# Patient Record
Sex: Female | Born: 1975 | Race: White | Hispanic: No | Marital: Married | Smoking: Current every day smoker
Health system: Southern US, Community
[De-identification: ages and names within clinical notes are randomized; demographics above are authoritative.]

## PROBLEM LIST (undated history)

## (undated) DIAGNOSIS — R0600 Dyspnea, unspecified: Secondary | ICD-10-CM

## (undated) DIAGNOSIS — R7303 Prediabetes: Secondary | ICD-10-CM

## (undated) DIAGNOSIS — E559 Vitamin D deficiency, unspecified: Secondary | ICD-10-CM

## (undated) DIAGNOSIS — E785 Hyperlipidemia, unspecified: Secondary | ICD-10-CM

## (undated) DIAGNOSIS — T7840XA Allergy, unspecified, initial encounter: Secondary | ICD-10-CM

## (undated) DIAGNOSIS — I10 Essential (primary) hypertension: Secondary | ICD-10-CM

## (undated) DIAGNOSIS — K219 Gastro-esophageal reflux disease without esophagitis: Secondary | ICD-10-CM

## (undated) DIAGNOSIS — J189 Pneumonia, unspecified organism: Secondary | ICD-10-CM

## (undated) DIAGNOSIS — N809 Endometriosis, unspecified: Secondary | ICD-10-CM

## (undated) DIAGNOSIS — G43909 Migraine, unspecified, not intractable, without status migrainosus: Secondary | ICD-10-CM

## (undated) HISTORY — DX: Allergy, unspecified, initial encounter: T78.40XA

## (undated) HISTORY — DX: Prediabetes: R73.03

## (undated) HISTORY — DX: Hyperlipidemia, unspecified: E78.5

## (undated) HISTORY — DX: Gastro-esophageal reflux disease without esophagitis: K21.9

## (undated) HISTORY — DX: Vitamin D deficiency, unspecified: E55.9

## (undated) HISTORY — DX: Endometriosis, unspecified: N80.9

## (undated) HISTORY — PX: ABDOMINAL HYSTERECTOMY: SHX81

## (undated) HISTORY — PX: EYE SURGERY: SHX253

## (undated) HISTORY — DX: Essential (primary) hypertension: I10

## (undated) HISTORY — DX: Migraine, unspecified, not intractable, without status migrainosus: G43.909

---

## 1998-05-02 ENCOUNTER — Emergency Department (HOSPITAL_COMMUNITY): Admission: EM | Admit: 1998-05-02 | Discharge: 1998-05-02 | Payer: Self-pay | Admitting: Emergency Medicine

## 2001-04-10 ENCOUNTER — Other Ambulatory Visit: Admission: RE | Admit: 2001-04-10 | Discharge: 2001-04-10 | Payer: Self-pay | Admitting: *Deleted

## 2001-05-09 ENCOUNTER — Other Ambulatory Visit: Admission: RE | Admit: 2001-05-09 | Discharge: 2001-05-09 | Payer: Self-pay | Admitting: Gynecology

## 2001-10-26 ENCOUNTER — Other Ambulatory Visit: Admission: RE | Admit: 2001-10-26 | Discharge: 2001-10-26 | Payer: Self-pay | Admitting: Gynecology

## 2002-05-17 ENCOUNTER — Other Ambulatory Visit: Admission: RE | Admit: 2002-05-17 | Discharge: 2002-05-17 | Payer: Self-pay | Admitting: Gynecology

## 2002-07-12 HISTORY — PX: REFRACTIVE SURGERY: SHX103

## 2003-02-19 ENCOUNTER — Other Ambulatory Visit: Admission: RE | Admit: 2003-02-19 | Discharge: 2003-02-19 | Payer: Self-pay | Admitting: Internal Medicine

## 2003-07-13 HISTORY — PX: REFRACTIVE SURGERY: SHX103

## 2004-03-17 ENCOUNTER — Ambulatory Visit (HOSPITAL_COMMUNITY): Admission: RE | Admit: 2004-03-17 | Discharge: 2004-03-17 | Payer: Self-pay | Admitting: Internal Medicine

## 2004-03-30 ENCOUNTER — Other Ambulatory Visit: Admission: RE | Admit: 2004-03-30 | Discharge: 2004-03-30 | Payer: Self-pay | Admitting: *Deleted

## 2005-08-04 ENCOUNTER — Other Ambulatory Visit: Admission: RE | Admit: 2005-08-04 | Discharge: 2005-08-04 | Payer: Self-pay | Admitting: Gynecology

## 2006-08-16 ENCOUNTER — Other Ambulatory Visit: Admission: RE | Admit: 2006-08-16 | Discharge: 2006-08-16 | Payer: Self-pay | Admitting: Gynecology

## 2008-05-20 ENCOUNTER — Ambulatory Visit (HOSPITAL_COMMUNITY): Admission: RE | Admit: 2008-05-20 | Discharge: 2008-05-20 | Payer: Self-pay | Admitting: Internal Medicine

## 2010-10-26 ENCOUNTER — Emergency Department: Payer: Self-pay | Admitting: Emergency Medicine

## 2012-05-03 ENCOUNTER — Other Ambulatory Visit: Payer: Self-pay | Admitting: Emergency Medicine

## 2012-05-03 DIAGNOSIS — N926 Irregular menstruation, unspecified: Secondary | ICD-10-CM

## 2012-05-08 ENCOUNTER — Ambulatory Visit
Admission: RE | Admit: 2012-05-08 | Discharge: 2012-05-08 | Disposition: A | Discharge status: Home / Self Care | Payer: 59 | Source: Ambulatory Visit | Attending: Emergency Medicine | Admitting: Emergency Medicine

## 2012-05-08 ENCOUNTER — Ambulatory Visit (HOSPITAL_COMMUNITY)
Admission: RE | Admit: 2012-05-08 | Discharge: 2012-05-08 | Disposition: A | Discharge status: Home / Self Care | Payer: 59 | Source: Ambulatory Visit | Attending: Internal Medicine | Admitting: Internal Medicine

## 2012-05-08 ENCOUNTER — Other Ambulatory Visit (HOSPITAL_COMMUNITY): Payer: Self-pay | Admitting: Internal Medicine

## 2012-05-08 DIAGNOSIS — M25559 Pain in unspecified hip: Secondary | ICD-10-CM | POA: Insufficient documentation

## 2012-05-08 DIAGNOSIS — M545 Low back pain, unspecified: Secondary | ICD-10-CM | POA: Insufficient documentation

## 2012-05-08 DIAGNOSIS — N926 Irregular menstruation, unspecified: Secondary | ICD-10-CM

## 2013-05-27 ENCOUNTER — Encounter: Payer: Self-pay | Admitting: Physician Assistant

## 2013-05-27 DIAGNOSIS — E785 Hyperlipidemia, unspecified: Secondary | ICD-10-CM | POA: Insufficient documentation

## 2013-05-27 DIAGNOSIS — K219 Gastro-esophageal reflux disease without esophagitis: Secondary | ICD-10-CM | POA: Insufficient documentation

## 2013-05-27 DIAGNOSIS — I10 Essential (primary) hypertension: Secondary | ICD-10-CM | POA: Insufficient documentation

## 2013-05-27 DIAGNOSIS — E559 Vitamin D deficiency, unspecified: Secondary | ICD-10-CM

## 2013-05-27 DIAGNOSIS — R7303 Prediabetes: Secondary | ICD-10-CM

## 2013-05-30 ENCOUNTER — Ambulatory Visit: Payer: 59 | Admitting: Physician Assistant

## 2013-05-30 ENCOUNTER — Encounter: Payer: Self-pay | Admitting: Physician Assistant

## 2013-05-30 VITALS — BP 122/80 | HR 60 | Temp 97.7°F | Resp 16 | Ht 64.0 in | Wt 151.0 lb

## 2013-05-30 DIAGNOSIS — E559 Vitamin D deficiency, unspecified: Secondary | ICD-10-CM

## 2013-05-30 DIAGNOSIS — Z79899 Other long term (current) drug therapy: Secondary | ICD-10-CM

## 2013-05-30 DIAGNOSIS — I10 Essential (primary) hypertension: Secondary | ICD-10-CM

## 2013-05-30 DIAGNOSIS — N3000 Acute cystitis without hematuria: Secondary | ICD-10-CM

## 2013-05-30 DIAGNOSIS — K219 Gastro-esophageal reflux disease without esophagitis: Secondary | ICD-10-CM

## 2013-05-30 DIAGNOSIS — E785 Hyperlipidemia, unspecified: Secondary | ICD-10-CM

## 2013-05-30 DIAGNOSIS — R7303 Prediabetes: Secondary | ICD-10-CM

## 2013-05-30 LAB — CBC WITH DIFFERENTIAL/PLATELET
Basophils Absolute: 0.1 10*3/uL (ref 0.0–0.1)
Basophils Relative: 1 % (ref 0–1)
Eosinophils Absolute: 0.1 10*3/uL (ref 0.0–0.7)
Eosinophils Relative: 1 % (ref 0–5)
Hemoglobin: 13.9 g/dL (ref 12.0–15.0)
Lymphocytes Relative: 31 % (ref 12–46)
Lymphs Abs: 3.2 10*3/uL (ref 0.7–4.0)
Monocytes Relative: 7 % (ref 3–12)
Neutrophils Relative %: 60 % (ref 43–77)
Platelets: 185 10*3/uL (ref 150–400)
RBC: 4.41 MIL/uL (ref 3.87–5.11)
WBC: 10.1 10*3/uL (ref 4.0–10.5)

## 2013-05-30 LAB — HEPATIC FUNCTION PANEL
AST: 17 U/L (ref 0–37)
Alkaline Phosphatase: 69 U/L (ref 39–117)
Bilirubin, Direct: 0.1 mg/dL (ref 0.0–0.3)
Total Protein: 7 g/dL (ref 6.0–8.3)

## 2013-05-30 LAB — LIPID PANEL
Cholesterol: 145 mg/dL (ref 0–200)
Total CHOL/HDL Ratio: 4.3 Ratio
Triglycerides: 117 mg/dL (ref <150)

## 2013-05-30 LAB — BASIC METABOLIC PANEL WITH GFR
CO2: 30 mEq/L (ref 19–32)
Chloride: 104 mEq/L (ref 96–112)
GFR, Est Non African American: >89 mL/min
Potassium: 4.1 mEq/L (ref 3.5–5.3)

## 2013-05-30 MED ORDER — CIPROFLOXACIN HCL 500 MG PO TABS: 500.0000 mg | ORAL_TABLET | Freq: Two times a day (BID) | ORAL | Status: AC

## 2013-05-30 NOTE — Patient Instructions (Signed)

## 2013-05-30 NOTE — Progress Notes (Addendum)
HPI Patient presents for 3 month follow up with hypertension, hyperlipidemia, prediabetes and vitamin D. Patient's blood pressure has been controlled at home. Patient denies chest pain, shortness of breath, dizziness.  Patient's cholesterol is diet controlled. In addition they are on zocor 40  and denies myalgias. The cholesterol last visit was LDL 84, HDL 28.. The patient has been working on diet and exercise, increased walking 3 miles a week, and decrease white bread, for prediabetes, and denies changes in vision, polys, and paresthesias. A1C 5.8 Patient is on Vitamin D supplement.  Started magesium GERD on pepcid and prilosec- occasional chest tightness, pressure have to sit up at night, especially with pizza, spaghetti, fried shrimp.  Current Medications:  Current Outpatient Prescriptions on File Prior to Visit  Medication Sig Dispense Refill  . cholecalciferol (VITAMIN D) 1000 UNITS tablet Take 2,000 Units by mouth daily.      . famotidine (PEPCID) 40 MG tablet Take 40 mg by mouth daily.      Marland Kitchen loratadine (CLARITIN) 10 MG tablet Take 10 mg by mouth daily.      . metoprolol tartrate (LOPRESSOR) 25 MG tablet Take 25 mg by mouth daily.      . nitroGLYCERIN (NITROSTAT) 0.3 MG SL tablet Place 0.3 mg under the tongue every 5 (five) minutes as needed for chest pain.      . simvastatin (ZOCOR) 40 MG tablet Take 40 mg by mouth daily.       No current facility-administered medications on file prior to visit.   Medical History:  Past Medical History  Diagnosis Date  . Hypertension   . Hyperlipidemia   . GERD (gastroesophageal reflux disease)   . Allergy   . Vitamin D deficiency   . Endometriosis   . Migraine   . Prediabetes    Allergies:  Allergies  Allergen Reactions  . Penicillins     rash    ROS Constitutional: Denies fever, chills, weight loss/gain, headaches, insomnia, fatigue, night sweats, and change in appetite. Eyes: Denies redness, blurred vision, diplopia, discharge,  itchy, watery eyes.  ENT: Denies discharge, congestion, post nasal drip, sore throat, earache, dental pain, Tinnitus, Vertigo, Sinus pain, snoring.  Cardio: Denies chest pain, palpitations, irregular heartbeat,  dyspnea, diaphoresis, orthopnea, PND, claudication, edema Respiratory: denies cough, dyspnea,pleurisy, hoarseness, wheezing.  Gastrointestinal: + GERD Denies dysphagia, pain, cramps, nausea, vomiting, bloating, diarrhea, constipation, hematemesis, melena, hematochezia,  hemorrhoids Genitourinary: + frequency, urgency, and left lower back pain for one week.  Musculoskeletal: Denies arthralgia, myalgia, stiffness, Jt. Swelling, pain, limp, and strain/sprain. Skin: Denies pruritis, rash, hives, warts, acne, eczema, changing in skin lesion Neuro: Weakness, tremor, incoordination, spasms, paresthesia, pain Psychiatric: Denies confusion, memory loss, sensory loss Endocrine: Denies change in weight, skin, hair change, nocturia, and paresthesia, Diabetic Polys, visual blurring, hyper /hypo glycemic episodes.  Heme/Lymph: Excessive bleeding, bruising, enlarged lymph nodes  Family history- Review and unchanged Social history- Review and unchanged Physical Exam: Filed Vitals:   05/30/13 0938  BP: 122/80  Pulse: 60  Temp: 97.7 F (36.5 C)  Resp: 16   Filed Weights   05/30/13 0938  Weight: 151 lb (68.493 kg)   General Appearance: Well nourished, in no apparent distress. Eyes: PERRLA, EOMs, conjunctiva no swelling or erythema, normal fundi and vessels. Sinuses: No Frontal/maxillary tenderness ENT/Mouth: Ext aud canals clear, with TMs without erythema, bulging.No erythema, swelling, or exudate on post pharynx.  Tonsils not swollen or erythematous. Hearing normal.  Neck: Supple, thyroid normal.  Respiratory: Respiratory effort normal, BS equal bilaterally  without rales, rhonchi, wheezing or stridor.  Cardio: Heart sounds normal, regular rate and rhythm without murmurs, rubs or gallops.  Peripheral pulses brisk and equal bilaterally, without edema.  Abdomen: Flat, soft, with bowel sounds. Mild epigastric tenderness, no guarding, rebound, hernias, masses, or organomegaly. + CVA tenderness Lymphatics: Non tender without lymphadenopathy.  Musculoskeletal: Full ROM all peripheral extremities, joint stability, 5/5 strength, and normal gait. Skin: Warm, dry without rashes, lesions, ecchymosis.  Neuro: Cranial nerves intact, reflexes equal bilaterally. Normal muscle tone, no cerebellar symptoms. Sensation intact.  Psych: Awake and oriented X 3, normal affect, Insight and Judgment appropriate.   Assessment and Plan:  Hypertension: Continue medication, monitor blood pressure at home. Continue DASH diet. Cholesterol: Continue diet and exercise. Check cholesterol.  Pre-diabetes-Continue diet and exercise. Check A1C Vitamin D Def- check level and continue medications.  GERD- resistant to PPI and H2- will check for Hpylori, continue PPI ?UTI- check UA/ C&C- start cipro Smoking- was on chantix 1/2 pill for 3 weeks but then had itching with it so stopped. Patient doesn't know if it was nerves or chantix, will restart after the holidays.   Quentin Mulling 10:08 AM   Patient had a positive Hpylori blood test. Will call in ABX and PPI for treatment.

## 2013-05-31 LAB — URINE CULTURE
Colony Count: NO GROWTH
Organism ID, Bacteria: NO GROWTH

## 2013-05-31 LAB — URINALYSIS, ROUTINE W REFLEX MICROSCOPIC
Leukocytes, UA: NEGATIVE
Nitrite: NEGATIVE
Protein, ur: NEGATIVE mg/dL

## 2013-05-31 LAB — INSULIN, FASTING: Insulin fasting, serum: 4 u[IU]/mL (ref 3–28)

## 2013-05-31 LAB — HELICOBACTER PYLORI ABS-IGG+IGA, BLD: HELICOBACTER PYLORI AB, IGA: 4 U/mL (ref <9.0)

## 2013-05-31 LAB — VITAMIN D 25 HYDROXY (VIT D DEFICIENCY, FRACTURES): Vit D, 25-Hydroxy: 74 ng/mL (ref 30–89)

## 2013-06-01 MED ORDER — CLARITHROMYCIN 500 MG PO TABS: 500.0000 mg | ORAL_TABLET | Freq: Two times a day (BID) | ORAL | Status: AC

## 2013-06-01 MED ORDER — OMEPRAZOLE 20 MG PO CPDR: 20.0000 mg | DELAYED_RELEASE_CAPSULE | Freq: Two times a day (BID) | ORAL | Status: DC

## 2013-06-01 MED ORDER — METRONIDAZOLE 500 MG PO TABS: 500.0000 mg | ORAL_TABLET | Freq: Two times a day (BID) | ORAL | Status: AC

## 2013-06-01 NOTE — Addendum Note (Signed)
Addended by: Quentin Mulling R on: 06/01/2013 08:33 AM   Modules accepted: Orders

## 2013-07-19 ENCOUNTER — Encounter: Payer: Self-pay | Admitting: Physician Assistant

## 2013-08-01 ENCOUNTER — Encounter: Payer: Self-pay | Admitting: Physician Assistant

## 2013-08-01 ENCOUNTER — Encounter: Payer: Self-pay | Admitting: Internal Medicine

## 2013-08-01 ENCOUNTER — Ambulatory Visit (INDEPENDENT_AMBULATORY_CARE_PROVIDER_SITE_OTHER): Payer: 59 | Admitting: Physician Assistant

## 2013-08-01 VITALS — BP 120/78 | HR 68 | Temp 98.0°F | Resp 16 | Ht 64.0 in | Wt 149.0 lb

## 2013-08-01 DIAGNOSIS — IMO0001 Reserved for inherently not codable concepts without codable children: Secondary | ICD-10-CM

## 2013-08-01 DIAGNOSIS — Z0000 Encounter for general adult medical examination without abnormal findings: Secondary | ICD-10-CM

## 2013-08-01 DIAGNOSIS — N63 Unspecified lump in unspecified breast: Secondary | ICD-10-CM

## 2013-08-01 DIAGNOSIS — I10 Essential (primary) hypertension: Secondary | ICD-10-CM

## 2013-08-01 DIAGNOSIS — K219 Gastro-esophageal reflux disease without esophagitis: Secondary | ICD-10-CM

## 2013-08-01 LAB — BASIC METABOLIC PANEL WITH GFR
BUN: 8 mg/dL (ref 6–23)
CALCIUM: 9.7 mg/dL (ref 8.4–10.5)
CO2: 29 mEq/L (ref 19–32)
CREATININE: 0.67 mg/dL (ref 0.50–1.10)
Chloride: 104 mEq/L (ref 96–112)
GFR, Est African American: >89 mL/min
GFR, Est Non African American: >89 mL/min
Glucose, Bld: 89 mg/dL (ref 70–99)
Potassium: 4.2 mEq/L (ref 3.5–5.3)
Sodium: 143 mEq/L (ref 135–145)

## 2013-08-01 LAB — CBC WITH DIFFERENTIAL/PLATELET
BASOS ABS: 0 10*3/uL (ref 0.0–0.1)
BASOS PCT: 0 % (ref 0–1)
EOS ABS: 0.1 10*3/uL (ref 0.0–0.7)
EOS PCT: 1 % (ref 0–5)
HCT: 41.5 % (ref 36.0–46.0)
HEMOGLOBIN: 14.2 g/dL (ref 12.0–15.0)
LYMPHS ABS: 2.7 10*3/uL (ref 0.7–4.0)
Lymphocytes Relative: 24 % (ref 12–46)
MCH: 31.2 pg (ref 26.0–34.0)
MCHC: 34.2 g/dL (ref 30.0–36.0)
MCV: 91.2 fL (ref 78.0–100.0)
MONOS PCT: 8 % (ref 3–12)
Monocytes Absolute: 0.9 10*3/uL (ref 0.1–1.0)
NEUTROS ABS: 7.4 10*3/uL (ref 1.7–7.7)
NEUTROS PCT: 67 % (ref 43–77)
PLATELETS: 158 10*3/uL (ref 150–400)
RBC: 4.55 MIL/uL (ref 3.87–5.11)
RDW: 13.8 % (ref 11.5–15.5)
WBC: 11.1 10*3/uL — AB (ref 4.0–10.5)

## 2013-08-01 LAB — HEPATIC FUNCTION PANEL
ALBUMIN: 4.8 g/dL (ref 3.5–5.2)
ALK PHOS: 71 U/L (ref 39–117)
ALT: 20 U/L (ref 0–35)
AST: 18 U/L (ref 0–37)
BILIRUBIN DIRECT: 0.1 mg/dL (ref 0.0–0.3)
BILIRUBIN TOTAL: 0.5 mg/dL (ref 0.3–1.2)
Indirect Bilirubin: 0.4 mg/dL (ref 0.0–0.9)
TOTAL PROTEIN: 7.2 g/dL (ref 6.0–8.3)

## 2013-08-01 LAB — SEDIMENTATION RATE: SED RATE: 8 mm/h (ref 0–22)

## 2013-08-01 LAB — HEMOGLOBIN A1C
Hgb A1c MFr Bld: 5.8 % — ABNORMAL HIGH (ref <5.7)
Mean Plasma Glucose: 120 mg/dL — ABNORMAL HIGH (ref <117)

## 2013-08-01 LAB — CK: CK TOTAL: 32 U/L (ref 7–177)

## 2013-08-01 LAB — LIPID PANEL
CHOL/HDL RATIO: 3.8 ratio
CHOLESTEROL: 145 mg/dL (ref 0–200)
HDL: 38 mg/dL — AB (ref >39)
LDL CALC: 84 mg/dL (ref 0–99)
Triglycerides: 114 mg/dL (ref <150)
VLDL: 23 mg/dL (ref 0–40)

## 2013-08-01 LAB — IRON AND TIBC
%SAT: 37 % (ref 20–55)
Iron: 119 ug/dL (ref 42–145)
TIBC: 321 ug/dL (ref 250–470)
UIBC: 202 ug/dL (ref 125–400)

## 2013-08-01 LAB — VITAMIN B12: Vitamin B-12: 375 pg/mL (ref 211–911)

## 2013-08-01 LAB — MAGNESIUM: Magnesium: 2.1 mg/dL (ref 1.5–2.5)

## 2013-08-01 LAB — TSH: TSH: 0.734 u[IU]/mL (ref 0.350–4.500)

## 2013-08-01 LAB — FERRITIN: Ferritin: 21 ng/mL (ref 10–291)

## 2013-08-01 MED ORDER — LEVOCETIRIZINE DIHYDROCHLORIDE 5 MG PO TABS: 5.0000 mg | ORAL_TABLET | Freq: Every evening | ORAL | Status: DC

## 2013-08-01 MED ORDER — METOPROLOL TARTRATE 25 MG PO TABS: 25.0000 mg | ORAL_TABLET | Freq: Every day | ORAL | Status: DC

## 2013-08-01 MED ORDER — OMEPRAZOLE 40 MG PO CPDR: 40.0000 mg | DELAYED_RELEASE_CAPSULE | Freq: Every day | ORAL | Status: DC

## 2013-08-01 MED ORDER — SIMVASTATIN 40 MG PO TABS: 40.0000 mg | ORAL_TABLET | Freq: Every day | ORAL | Status: DC

## 2013-08-01 NOTE — Patient Instructions (Addendum)
Continue PPI Try lyrica 1-2 pills at night and see if it helps with your leg pain.  Follow up with an EYE doctor    Bad carbs also include fruit juice, alcohol, and sweet tea. These are empty calories that do not signal to your brain that you are full.   Please remember the good carbs are still carbs which convert into sugar. So please measure them out no more than 1/2-1 cup of rice, oatmeal, pasta, and beans.  Veggies are however free foods! Pile them on.   I like lean protein at every meal such as chicken, [place], pork chops, cottage cheese, etc. Just do not fry these meats and please center your meal around vegetable, the meats should be a side dish.   No all fruit is created equal. Please see the list below, the fruit at the bottom is higher in sugars than the fruit at the top   What is the TMJ? The temporomandibular (tem-PUH-ro-man-DIB-yoo-ler) joint, or the TMJ, connects the upper and lower jawbones. This joint allows the jaw to open wide and move back and forth when you chew, talk, or yawn.There are also several muscles that help this joint move. There can be muscle tightness and pain in the muscle that can cause several symptoms.  What causes TMJ pain? There are many causes of TMJ pain. Repeated chewing (for example, chewing gum) and clenching your teeth can cause pain in the joint. Some TMJ pain has no obvious cause. What can I do to ease the pain? There are many things you can do to help your pain get better. When you have pain:  Eat soft foods and stay away from chewy foods (for example, taffy) Try to use both sides of your mouth to chew Don't chew gum Don't open your mouth wide (for example, during yawning or singing) Don't bite your cheeks or fingernails Lower your amount of stress and worry Applying a warm, damp washcloth to the joint may help. Over-the-counter pain medicines such as ibuprofen (one brand: Advil) or acetaminophen (one brand: Tylenol) might also help. Do not  use these medicines if you are allergic to them or if your doctor told you not to use them. How can I stop the pain from coming back? When your pain is better, you can do these exercises to make your muscles stronger and to keep the pain from coming back:  Resisted mouth opening: Place your thumb or two fingers under your chin and open your mouth slowly, pushing up lightly on your chin with your thumb. Hold for three to six seconds. Close your mouth slowly. Resisted mouth closing: Place your thumbs under your chin and your two index fingers on the ridge between your mouth and the bottom of your chin. Push down lightly on your chin as you close your mouth. Tongue up: Slowly open and close your mouth while keeping the tongue touching the roof of the mouth. Side-to-side jaw movement: Place an object about one fourth of an inch thick (for example, two tongue depressors) between your front teeth. Slowly move your jaw from side to side. Increase the thickness of the object as the exercise becomes easier Forward jaw movement: Place an object about one fourth of an inch thick between your front teeth and move the bottom jaw forward so that the bottom teeth are in front of the top teeth. Increase the thickness of the object as the exercise becomes easier. These exercises should not be painful. If it hurts to do  these exercises, stop doing them and talk to your family doctor.

## 2013-08-01 NOTE — Progress Notes (Signed)
Complete Physical HPI Patient presents for complete physical.   Patient's blood pressure has been controlled at home. Patient denies chest pain, shortness of breath, dizziness. BP: 120/78 mmHg  Patient's cholesterol is diet controlled. They are on zocor and denies myalgias. The patient's cholesterol last visit was LDL 84, trigs 136, HDL 28.  The patient has been working on diet and exercise for prediabetes, denies changes in vision, polys, and paresthesias. Last A1C in office was 5.8 Thrombocytopenia with platelets at 149.  Patient continues to have GERD symptoms, she tested positive for Hpylori in November and was on triple therapy for 10 days. She got better but now continues to have GERD, chest tightness, stomach cramping, and unable to lay down.  Patient states she has intermittent "bone pain" that has been more frequent, achy pain, last 20-60 mins. Nothing makes it worse, worse during the day or night but can happen both, + LBP, no numbness tingling, no bowel/bladder incontinence.   Current Medications:  Current Outpatient Prescriptions on File Prior to Visit  Medication Sig Dispense Refill  . cholecalciferol (VITAMIN D) 1000 UNITS tablet Take 2,000 Units by mouth daily.      . famotidine (PEPCID) 40 MG tablet Take 40 mg by mouth daily.      Marland Kitchen levocetirizine (XYZAL) 5 MG tablet Take 5 mg by mouth every evening.      . loratadine (CLARITIN) 10 MG tablet Take 10 mg by mouth daily.      . Magnesium 250 MG TABS Take 250 mg by mouth daily.      . metoprolol tartrate (LOPRESSOR) 25 MG tablet Take 25 mg by mouth daily.      . nitroGLYCERIN (NITROSTAT) 0.3 MG SL tablet Place 0.3 mg under the tongue every 5 (five) minutes as needed for chest pain.      Marland Kitchen omeprazole (PRILOSEC) 20 MG capsule Take 1 capsule (20 mg total) by mouth 2 (two) times daily before a meal.  20 capsule  1  . simvastatin (ZOCOR) 40 MG tablet Take 40 mg by mouth daily.       No current facility-administered medications on file  prior to visit.   Health Maintenance:  Tetanus: 07/2012 Pneumovax: N/A Flu vaccine: declines  Zostavax: N/A Pap: 2013 Dr. Ubaldo Glassing, had an abnormal pap in 2011 but all normal since then. And Dr. Ubaldo Glassing has retired, getting new OB/GYN.  MGM: N/A DEXA: N/A  Colonoscopy:N/A  EGD: N/A Allergies:  Allergies  Allergen Reactions  . Penicillins     rash   Medical History:  Past Medical History  Diagnosis Date  . Hypertension   . Hyperlipidemia   . GERD (gastroesophageal reflux disease)   . Allergy   . Vitamin D deficiency   . Endometriosis   . Migraine   . Prediabetes    Surgical History:  Past Surgical History  Procedure Laterality Date  . Refractive surgery  2004   Family History:  Family History  Problem Relation Age of Onset  . Hyperlipidemia Mother   . Heart disease Father   . Hyperlipidemia Father   . Hypertension Father    Social History:  History   Social History  . Marital Status: Married    Spouse Name: N/A    Number of Children: N/A  . Years of Education: N/A   Occupational History  . Not on file.   Social History Main Topics  . Smoking status: Current Every Day Smoker -- 0.50 packs/day for 20 years  . Smokeless tobacco: Never Used  .  Alcohol Use: No  . Drug Use: No  . Sexual Activity: Not on file   Other Topics Concern  . Not on file   Social History Narrative  . No narrative on file   ROS Constitutional: Denies weight loss/gain, headaches, insomnia, fatigue, night sweats, and change in appetite. Eyes: Denies redness, blurred vision, diplopia, discharge, itchy, watery eyes.  ENT: Denies discharge, congestion, post nasal drip, sore throat, earache, hearing loss, dental pain, Tinnitus, Vertigo, Sinus pain, snoring.  Cardio: Denies chest pain, palpitations, irregular heartbeat, dyspnea, diaphoresis, orthopnea, PND, claudication, edema Respiratory: denies cough, dyspnea, pleurisy, hoarseness, wheezing.  Gastrointestinal: + GERD ,stomach pain,  burping Denies dysphagia,  pain, cramps, nausea, vomiting, diarrhea, constipation, hematemesis, melena, hematochezia, hemorrhoids Genitourinary: Denies dysuria, frequency, urgency, nocturia, hesitancy, discharge, hematuria, flank pain Breast: Denies Breast lumps, nipple discharge, bleeding.  Musculoskeletal: + LBP and leg pain Denies arthralgia, stiffness, Jt. Swelling Skin: Denies pruritis, rash, hives,  acne, eczema, changing in skin lesion Neuro: Denies Weakness, tremor, incoordination, spasms, paresthesia, pain Psychiatric: Denies confusion, memory loss, sensory loss Endocrine: Denies change in weight, skin, hair change, nocturia, and paresthesia, Diabetic Denies Polys, visual blurring, hyper /hypo glycemic episodes.  Heme/Lymph: Denies Excessive bleeding, bruising, enlarged lymph nodes  Physical Exam: Estimated body mass index is 25.56 kg/(m^2) as calculated from the following:   Height as of this encounter: 5' 4"  (1.626 m).   Weight as of this encounter: 149 lb (67.586 kg). Filed Vitals:   08/01/13 0857  BP: 120/78  Pulse: 68  Temp: 98 F (36.7 C)  Resp: 16   General Appearance: Well nourished, in no apparent distress. Eyes: PERRLA, EOMs, conjunctiva no swelling or erythema, normal fundi and vessels. Sinuses: No Frontal/maxillary tenderness ENT/Mouth: Ext aud canals clear, normal light reflex with TMs without erythema, bulging.  Good dentition. No erythema, swelling, or exudate on post pharynx. Tonsils not swollen or erythematous. Hearing normal. + TMJ tenderness on left side> right Neck: Supple, thyroid normal. No bruits Respiratory: Respiratory effort normal, BS equal bilaterally without rales, rhonchi, wheezing or stridor. Cardio: RRR without murmurs, rubs or gallops. Brisk peripheral pulses without edema.  Chest: symmetric, with normal excursions and percussion. Breasts: nontender mobile mass has increased in size since last visit in RUQ of right breast.  Abdomen: Soft, +BS.  RUQ tenderness, no guarding, rebound, hernias, masses, or organomegaly. .  Lymphatics: Non tender without lymphadenopathy.  Genitourinary: defer Musculoskeletal: Full ROM all peripheral extremities,5/5 strength, and normal gait. + RSI tenderness Skin: Warm, dry without rashes, lesions, ecchymosis.  Neuro: Cranial nerves intact, reflexes equal bilaterally. Normal muscle tone, no cerebellar symptoms. Sensation intact.  Psych: Awake and oriented X 3, normal affect, Insight and Judgment appropriate.   EKG: WNL no changes.  Assessment and Plan: Hypertension- at goal  Hyperlipidemia- cont diet and exercise, check level  Allergy  Vitamin D deficiency- check level  Endometriosis- follow up OB/GYN  Migraine- controlled  Prediabetes- cont diet and exercise, check level  Thrombocytopenia- check CBC GERD- history Hpylori with treatment- ? If eradicated- discussed doing stool vs sending to GI for EGD, patient would prefer to see GI and I agree to r/o ulcers as well  No NSAID, continue PPI , will refer to GI.  RUQ pain with mother with history of gallstones- get U/S Leg pain- normal Xray lumbar in 2013- ? Spinal stenosis will check CPK/ESR and treat conservatively.  PAP- due now- prefers done at OB/GYN.  TMJ- given info and get mouth guard.  Right breast- nontender mobile mass has  increased in size- get dMGM   Discussed med's effects and SE's. Screening labs and tests as requested with regular follow-up as recommended.   Vicie Mutters 9:18 AM

## 2013-08-02 ENCOUNTER — Other Ambulatory Visit: Payer: Self-pay | Admitting: Physician Assistant

## 2013-08-02 DIAGNOSIS — N63 Unspecified lump in unspecified breast: Secondary | ICD-10-CM

## 2013-08-02 LAB — URINALYSIS, ROUTINE W REFLEX MICROSCOPIC
BILIRUBIN URINE: NEGATIVE
GLUCOSE, UA: NEGATIVE mg/dL
Hgb urine dipstick: NEGATIVE
KETONES UR: NEGATIVE mg/dL
LEUKOCYTES UA: NEGATIVE
Nitrite: NEGATIVE
Protein, ur: NEGATIVE mg/dL
Urobilinogen, UA: 0.2 mg/dL (ref 0.0–1.0)
pH: 7 (ref 5.0–8.0)

## 2013-08-02 LAB — MICROALBUMIN / CREATININE URINE RATIO
Creatinine, Urine: 24.4 mg/dL
MICROALB UR: 0.5 mg/dL (ref 0.00–1.89)
MICROALB/CREAT RATIO: 20.5 mg/g (ref 0.0–30.0)

## 2013-08-02 LAB — INSULIN, FASTING: Insulin fasting, serum: 11 u[IU]/mL (ref 3–28)

## 2013-08-02 LAB — VITAMIN D 25 HYDROXY (VIT D DEFICIENCY, FRACTURES): VIT D 25 HYDROXY: 89 ng/mL (ref 30–89)

## 2013-08-03 ENCOUNTER — Ambulatory Visit
Admission: RE | Admit: 2013-08-03 | Discharge: 2013-08-03 | Disposition: A | Discharge status: Home / Self Care | Payer: 59 | Source: Ambulatory Visit | Attending: Physician Assistant | Admitting: Physician Assistant

## 2013-08-03 DIAGNOSIS — K219 Gastro-esophageal reflux disease without esophagitis: Secondary | ICD-10-CM

## 2013-08-09 ENCOUNTER — Other Ambulatory Visit: Payer: Self-pay | Admitting: Physician Assistant

## 2013-08-09 ENCOUNTER — Ambulatory Visit
Admission: RE | Admit: 2013-08-09 | Discharge: 2013-08-09 | Disposition: A | Discharge status: Home / Self Care | Payer: 59 | Source: Ambulatory Visit | Attending: Physician Assistant | Admitting: Physician Assistant

## 2013-08-09 DIAGNOSIS — N63 Unspecified lump in unspecified breast: Secondary | ICD-10-CM

## 2013-08-28 ENCOUNTER — Ambulatory Visit: Payer: 59 | Admitting: Internal Medicine

## 2013-09-10 ENCOUNTER — Encounter: Payer: Self-pay | Admitting: Internal Medicine

## 2013-09-11 ENCOUNTER — Ambulatory Visit (INDEPENDENT_AMBULATORY_CARE_PROVIDER_SITE_OTHER): Payer: 59 | Admitting: Internal Medicine

## 2013-09-11 ENCOUNTER — Encounter: Payer: Self-pay | Admitting: Internal Medicine

## 2013-09-11 ENCOUNTER — Other Ambulatory Visit (INDEPENDENT_AMBULATORY_CARE_PROVIDER_SITE_OTHER): Payer: 59

## 2013-09-11 VITALS — BP 126/84 | HR 78 | Ht 65.0 in | Wt 151.0 lb

## 2013-09-11 DIAGNOSIS — R140 Abdominal distension (gaseous): Secondary | ICD-10-CM

## 2013-09-11 DIAGNOSIS — K824 Cholesterolosis of gallbladder: Secondary | ICD-10-CM | POA: Insufficient documentation

## 2013-09-11 DIAGNOSIS — R143 Flatulence: Secondary | ICD-10-CM

## 2013-09-11 DIAGNOSIS — R0789 Other chest pain: Secondary | ICD-10-CM

## 2013-09-11 DIAGNOSIS — K219 Gastro-esophageal reflux disease without esophagitis: Secondary | ICD-10-CM

## 2013-09-11 DIAGNOSIS — R141 Gas pain: Secondary | ICD-10-CM

## 2013-09-11 DIAGNOSIS — R142 Eructation: Secondary | ICD-10-CM

## 2013-09-11 DIAGNOSIS — Z8619 Personal history of other infectious and parasitic diseases: Secondary | ICD-10-CM

## 2013-09-11 DIAGNOSIS — K589 Irritable bowel syndrome without diarrhea: Secondary | ICD-10-CM

## 2013-09-11 LAB — IGA: IgA: 149 mg/dL (ref 68–378)

## 2013-09-11 MED ORDER — DEXLANSOPRAZOLE 60 MG PO CPDR: 60.0000 mg | DELAYED_RELEASE_CAPSULE | Freq: Every day | ORAL | Status: DC

## 2013-09-11 NOTE — Progress Notes (Signed)
Patient ID: Andrea Owens, female   DOB: 1976/06/28, 38 y.o.   MRN: [id number] HPI: Andrea Owens is a 38 year old female with a past medical history of GERD, hypertension, hyperlipidemia, migraines who is seen in consultation at the request of Vicie Mutters, PA-C to evaluate GERD and atypical chest pain. She is here today alone. She reports several years of GERD symptoms and chest pressure which has been worsening over the past year. She reports a substernal chest pressure and tightness which can be severe. This does not happen every day and seems to build, and when it occurs can happen on several consecutive days. She does not have true heartburn but does endorse waterbrash and occasional regurgitation. When her chest pressure is most severe she feels a mid abdominal soreness which is bilateral. She denies nausea or vomiting. No dysphagia or diet dysphagia. No weight loss. Her appetite has been good. She tried omeprazole 40 mg daily and found no benefit. She is currently using famotidine 40 mg once daily. With this, her symptoms seem to be better but not completely. She was given sublingual nitroglycerin which she used once for chest pressure/tightness but she did not like the response. She felt headache and disorientation. She doesn't recall it helps significantly. She also reports frequent gas and bloating. She has tried to modify her diet including avoiding spicy foods. She is noted that let us and popcorn make her symptoms worse. Bowel habits have been somewhat erratic and she tends towards constipation long-standing. She started magnesium supplementation for her hypomagnesemia and with this her stools have been a little more frequent. She has a bowel movement about 4 days per week. She denies blood in her stool or melena. She does not relate bowel movements to her abdominal discomfort.  Of note she was diagnosed with H. Pylori in Nov 2014 by antibody and treated with ciprofloxacin, metronidazole and  clarithromycin. During her antibiotics for 2 weeks she did not have any symptoms, but her above-mentioned symptoms returned after antibiotic cessation  Past Medical History  Diagnosis Date  . Hypertension   . Hyperlipidemia   . GERD (gastroesophageal reflux disease)   . Allergy   . Vitamin D deficiency   . Endometriosis   . Migraine   . Prediabetes     Past Surgical History  Procedure Laterality Date  . Refractive surgery  2004    Current Outpatient Prescriptions  Medication Sig Dispense Refill  . cholecalciferol (VITAMIN D) 1000 UNITS tablet Take 2,000 Units by mouth daily.      . famotidine (PEPCID) 40 MG tablet Take 40 mg by mouth daily.      Marland Kitchen levocetirizine (XYZAL) 5 MG tablet Take 1 tablet (5 mg total) by mouth every evening.  90 tablet  1  . loratadine (CLARITIN) 10 MG tablet Take 10 mg by mouth daily.      . Magnesium 250 MG TABS Take 250 mg by mouth daily.      . metoprolol tartrate (LOPRESSOR) 25 MG tablet Take 1 tablet (25 mg total) by mouth daily.  90 tablet  1  . nitroGLYCERIN (NITROSTAT) 0.3 MG SL tablet Place 0.3 mg under the tongue every 5 (five) minutes as needed for chest pain.      Marland Kitchen omeprazole (PRILOSEC) 40 MG capsule Take 1 capsule (40 mg total) by mouth daily.  90 capsule  1  . simvastatin (ZOCOR) 40 MG tablet Take 1 tablet (40 mg total) by mouth daily.  90 tablet  1   No current facility-administered  medications for this visit.    Allergies  Allergen Reactions  . Penicillins     rash    Family History  Problem Relation Age of Onset  . Hyperlipidemia Mother   . Heart disease Father   . Hyperlipidemia Father   . Hypertension Father   . Breast cancer Maternal Aunt     [employer]  . Prostate cancer Maternal Grandfather   . Irritable bowel syndrome Mother     History  Substance Use Topics  . Smoking status: Current Every Day Smoker -- 0.50 packs/day for 20 years  . Smokeless tobacco: Never Used  . Alcohol Use: No    ROS: As per history of  present illness, otherwise negative  BP 126/84  Pulse 78  Ht 5' 5"  (1.651 m)  Wt 151 lb (68.493 kg)  BMI 25.13 kg/m2  LMP 09/11/2013 Constitutional: Well-developed and well-nourished. No distress. HEENT: Normocephalic and atraumatic. Oropharynx is clear and moist. No oropharyngeal exudate. Conjunctivae are normal.  No scleral icterus. Neck: Neck supple. Trachea midline. Cardiovascular: Normal rate, regular rhythm and intact distal pulses. No M/R/G Pulmonary/chest: Effort normal and breath sounds normal. No wheezing, rales or rhonchi. Abdominal: Soft, nontender, nondistended. Bowel sounds active throughout. There are no masses palpable. No hepatosplenomegaly. Extremities: no clubbing, cyanosis, or edema Lymphadenopathy: No cervical adenopathy noted. Neurological: Alert and oriented to person place and time. Skin: Skin is warm and dry. No rashes noted. Psychiatric: Normal mood and affect. Behavior is normal.  RELEVANT LABS AND IMAGING: CBC    Component Value Date/Time   WBC 11.1* 08/01/2013 0958   RBC 4.55 08/01/2013 0958   HGB 14.2 08/01/2013 0958   HCT 41.5 08/01/2013 0958   PLT 158 08/01/2013 0958   MCV 91.2 08/01/2013 0958   MCH 31.2 08/01/2013 0958   MCHC 34.2 08/01/2013 0958   RDW 13.8 08/01/2013 0958   LYMPHSABS 2.7 08/01/2013 0958   MONOABS 0.9 08/01/2013 0958   EOSABS 0.1 08/01/2013 0958   BASOSABS 0.0 08/01/2013 0958    CMP     Component Value Date/Time   NA 143 08/01/2013 0958   K 4.2 08/01/2013 0958   CL 104 08/01/2013 0958   CO2 29 08/01/2013 0958   GLUCOSE 89 08/01/2013 0958   BUN 8 08/01/2013 0958   CREATININE 0.67 08/01/2013 0958   CALCIUM 9.7 08/01/2013 0958   PROT 7.2 08/01/2013 0958   ALBUMIN 4.8 08/01/2013 0958   AST 18 08/01/2013 0958   ALT 20 08/01/2013 0958   ALKPHOS 71 08/01/2013 0958   BILITOT 0.5 08/01/2013 0958   TSH normal  Erythrocyte Sedimentation Rate     Component Value Date/Time   ESRSEDRATE 8 08/01/2013 0958    Iron/TIBC/Ferritin    Component  Value Date/Time   IRON 119 08/01/2013 0958   TIBC 321 08/01/2013 0958   FERRITIN 21 08/01/2013 0958     ULTRASOUND ABDOMEN COMPLETE -- Jan 2015   COMPARISON:  None.   FINDINGS: Gallbladder:   4 mm gallbladder polyp in the fundus, benign. No gallstones, gallbladder wall thickening, or pericholecystic fluid. Negative sonographic Murphy's sign.   Common bile duct:   Diameter: 3 mm.   Liver:   5 x 6 x 6 mm probable hemangioma in the right liver (image 34).   IVC:   No abnormality visualized.   Pancreas:   Visualized portions are unremarkable.   Spleen:   Measures 4.5 cm.   Right Kidney:   Length: 11.2 cm.  No mass or hydronephrosis.   Left  Kidney:   Length: 10.0 cm.  Mild cortical lobulation.  No hydronephrosis.   Abdominal aorta:   No aneurysm visualized.   Other findings:   None.   IMPRESSION: 4 mm gallbladder polyp, benign.   No cholelithiasis or findings to suggest acute cholecystitis.   6 mm probable hemangioma in the right liver.  ASSESSMENT/PLAN: 38 year old female with a past medical history of GERD, hypertension, hyperlipidemia, migraines who is seen in consultation at the request of Vicie Mutters, PA-C to evaluate GERD and atypical chest pain.  1.  GERD/atypical chest pain -- some of her symptoms are consistent with GERD including regurgitation and water brash. She does not have typical heartburn. She did not respond omeprazole 40 mg daily, though famotidine seems to help some. We discussed uncontrolled reflux disease and also esophageal dysmotility disorder such as spasm or nutcracker esophagus. Often esophageal dysmotility is driven by uncontrolled reflux disease. I have recommended upper endoscopy for direct visualization. We discussed the risks and benefits and she is agreeable to proceed. I will plan esophageal biopsies to rule out eosinophilic esophagitis. I also will give her a prescription for Dexilant 60 mg once daily. I asked that she take  this 30 minutes to an hour before breakfast each day. She will discontinue Pepcid at this time but can use it for breakthrough if necessary.  2.  Gas and bloating/abdominal soreness -- upper endoscopy as above. We'll also check a celiac panel. Some of this could be related to IBS, and have recommended a trial of the FODMAP diet.  She was given a copy of the FODMAP diet today.    3.  H Pylori -- history of positive H. pylori antibody status post treatment. We'll plan gastric biopsies to confirm eradication of her upper endoscopy.  4.  Gallbladder polyp -- subcentimeter. No evidence for gallstones. Recommend repeat abdominal ultrasound in one year to assess stability of gallbladder polyp. If it grows in size she may need cholecystectomy.  Further recommendations after EGD

## 2013-09-11 NOTE — Patient Instructions (Addendum)
You have been given a separate informational sheet regarding your tobacco use, the importance of quitting and local resources to help you quit. You have been scheduled for an endoscopy with propofol. Please follow written instructions given to you at your visit today. If you use inhalers (even only as needed), please bring them with you on the day of your procedure. Your physician has requested that you go to www.startemmi.com and enter the access code given to you at your visit today. This web site gives a general overview about your procedure. However, you should still follow specific instructions given to you by our office regarding your preparation for the procedure.   You have been given a fodmap diet   We have sent the following medications to your pharmacy for you to pick up at your convenience: [facility] has requested that you go to the basement for the following lab work before leaving today: Celiac panel  Discontinue taking Omeprazole and you can use Pepcid for breakthrough acid reflux

## 2013-09-12 LAB — TISSUE TRANSGLUTAMINASE, IGA: Tissue Transglutaminase Ab, IgA: 5.1 U/mL (ref <20)

## 2013-09-14 ENCOUNTER — Ambulatory Visit (AMBULATORY_SURGERY_CENTER): Payer: 59 | Admitting: Internal Medicine

## 2013-09-14 ENCOUNTER — Encounter: Payer: Self-pay | Admitting: Internal Medicine

## 2013-09-14 VITALS — BP 129/84 | HR 70 | Temp 97.8°F | Resp 18 | Ht 65.0 in | Wt 151.0 lb

## 2013-09-14 DIAGNOSIS — D1330 Benign neoplasm of unspecified part of small intestine: Secondary | ICD-10-CM

## 2013-09-14 DIAGNOSIS — A048 Other specified bacterial intestinal infections: Secondary | ICD-10-CM

## 2013-09-14 DIAGNOSIS — R141 Gas pain: Secondary | ICD-10-CM

## 2013-09-14 DIAGNOSIS — R143 Flatulence: Secondary | ICD-10-CM

## 2013-09-14 DIAGNOSIS — R0789 Other chest pain: Secondary | ICD-10-CM

## 2013-09-14 DIAGNOSIS — R142 Eructation: Secondary | ICD-10-CM

## 2013-09-14 DIAGNOSIS — K219 Gastro-esophageal reflux disease without esophagitis: Secondary | ICD-10-CM

## 2013-09-14 DIAGNOSIS — R140 Abdominal distension (gaseous): Secondary | ICD-10-CM

## 2013-09-14 MED ORDER — SODIUM CHLORIDE 0.9 % IV SOLN: 500.0000 mL | INTRAVENOUS | Status: DC

## 2013-09-14 NOTE — Progress Notes (Signed)
Stable to RR 

## 2013-09-14 NOTE — Patient Instructions (Signed)
YOU HAD AN ENDOSCOPIC PROCEDURE TODAY AT THE [facility]: Refer to the procedure report that was given to you for any specific questions about what was found during the examination.  If the procedure report does not answer your questions, please call your gastroenterologist to clarify.  If you requested that your care partner not be given the details of your procedure findings, then the procedure report has been included in a sealed envelope for you to review at your convenience later.  YOU SHOULD EXPECT: Some feelings of bloating in the abdomen. Passage of more gas than usual.  Walking can help get rid of the air that was put into your GI tract during the procedure and reduce the bloating. If you had a lower endoscopy (such as a colonoscopy or flexible sigmoidoscopy) you may notice spotting of blood in your stool or on the toilet paper. If you underwent a bowel prep for your procedure, then you may not have a normal bowel movement for a few days.  DIET: Your first meal following the procedure should be a light meal and then it is ok to progress to your normal diet.  A half-sandwich or bowl of soup is an example of a good first meal.  Heavy or fried foods are harder to digest and may make you feel nauseous or bloated.  Likewise meals heavy in dairy and vegetables can cause extra gas to form and this can also increase the bloating.  Drink plenty of fluids but you should avoid alcoholic beverages for 24 hours.  ACTIVITY: Your care partner should take you home directly after the procedure.  You should plan to take it easy, moving slowly for the rest of the day.  You can resume normal activity the day after the procedure however you should NOT DRIVE or use heavy machinery for 24 hours (because of the sedation medicines used during the test).    SYMPTOMS TO REPORT IMMEDIATELY: A gastroenterologist can be reached at any hour.  During normal business hours, 8:30 AM to 5:00 PM Monday through Friday,  call [phone].  After hours and on weekends, please call the GI answering service at [phone] who will take a message and have the physician on call contact you.     Following upper endoscopy (EGD)  Vomiting of blood or coffee ground material  New chest pain or pain under the shoulder blades  Painful or persistently difficult swallowing  New shortness of breath  Fever of 100F or higher  Black, tarry-looking stools  FOLLOW UP: If any biopsies were taken you will be contacted by phone or by letter within the next 1-3 weeks.  Call your gastroenterologist if you have not heard about the biopsies in 3 weeks.  Our staff will call the home number listed on your records the next business day following your procedure to check on you and address any questions or concerns that you may have at that time regarding the information given to you following your procedure. This is a courtesy call and so if there is no answer at the home number and we have not heard from you through the emergency physician on call, we will assume that you have returned to your regular daily activities without incident.  SIGNATURES/CONFIDENTIALITY: You and/or your care partner have signed paperwork which will be entered into your electronic medical record.  These signatures attest to the fact that that the information above on your After Visit Summary has been reviewed and is understood.    Full responsibility of the confidentiality of this discharge information lies with you and/or your care-partner.  Await biopsy results.    Office visit in 4-6 weeks.

## 2013-09-14 NOTE — Op Note (Addendum)
[name] [place], [id number]   ENDOSCOPY PROCEDURE REPORT  PATIENT: Andrea Owens, Andrea Owens  MR#: [id number] BIRTHDATE: June 16, 1976 , 37  yrs. old GENDER: Female ENDOSCOPIST: Jerene Bears, MD REFERRED BY:  Vicie Mutters, P.A. PROCEDURE DATE:  09/14/2013 PROCEDURE:  EGD w/ biopsy ASA CLASS:     Class II INDICATIONS:  history of GERD.   Chest pain.  Abdominal discomfort with gas and bloating MEDICATIONS: MAC anesthesia, propofol 140 mg IV TOPICAL ANESTHETIC:  None  DESCRIPTION OF PROCEDURE: After the risks benefits and alternatives of the procedure were thoroughly explained, informed consent was obtained.  The LB [id number] endoscope was introduced through the mouth and advanced to the second portion of the duodenum. Without limitations.  The instrument was slowly withdrawn as the mucosa was fully examined.   ESOPHAGUS: The mucosa of the esophagus appeared normal.  Multiple biopsies were performed.  STOMACH: The mucosa of the stomach appeared normal.  Biopsies were taken in the antrum and angularis.  DUODENUM: The duodenal mucosa showed no abnormalities in the bulb and second portion of the duodenum.  Cold forceps biopsies were taken in the bulb and second portion.  Retroflexed views revealed no abnormalities.     The scope was then withdrawn from the patient and the procedure completed.  COMPLICATIONS: There were no complications.  ENDOSCOPIC IMPRESSION: 1.   The mucosa of the esophagus appeared normal; multiple biopsies 2.   The mucosa of the stomach appeared normal; biopsies were taken in the antrum and angularis 3.   The duodenal mucosa showed no abnormalities in the bulb and second portion of the duodenum; biopsies  RECOMMENDATIONS: 1.  Await biopsy results 2.  Office visit in 4-6 weeks   eSigned:  Jerene Bears, MD 09/14/2013 4:07 PM Revised: 09/14/2013 4:07 PM CC:The Patient and Vicie Mutters, PA-C

## 2013-09-14 NOTE — Progress Notes (Signed)
Called to room to assist during endoscopic procedure.  Patient ID and intended procedure confirmed with present staff. Received instructions for my participation in the procedure from the performing physician.  

## 2013-09-17 ENCOUNTER — Telehealth: Payer: Self-pay

## 2013-09-17 NOTE — Telephone Encounter (Signed)
Left a message at [phone] for the pt to call us back if any questions or concerns. Maw

## 2013-09-21 ENCOUNTER — Encounter: Payer: Self-pay | Admitting: Internal Medicine

## 2013-09-21 ENCOUNTER — Other Ambulatory Visit: Payer: Self-pay

## 2013-09-21 MED ORDER — BIS SUBCIT-METRONID-TETRACYC 140-125-125 MG PO CAPS: 3.0000 | ORAL_CAPSULE | Freq: Three times a day (TID) | ORAL | Status: DC

## 2013-09-21 MED ORDER — OMEPRAZOLE 20 MG PO CPDR: 20.0000 mg | DELAYED_RELEASE_CAPSULE | Freq: Two times a day (BID) | ORAL | Status: DC

## 2013-10-01 ENCOUNTER — Encounter: Payer: Self-pay | Admitting: Physician Assistant

## 2013-10-01 ENCOUNTER — Ambulatory Visit (INDEPENDENT_AMBULATORY_CARE_PROVIDER_SITE_OTHER): Payer: 59 | Admitting: Physician Assistant

## 2013-10-01 VITALS — BP 100/68 | HR 80 | Temp 98.8°F | Resp 16 | Wt 153.0 lb

## 2013-10-01 DIAGNOSIS — J0100 Acute maxillary sinusitis, unspecified: Secondary | ICD-10-CM

## 2013-10-01 MED ORDER — SULFAMETHOXAZOLE-TMP DS 800-160 MG PO TABS: 1.0000 | ORAL_TABLET | Freq: Two times a day (BID) | ORAL | Status: DC

## 2013-10-01 MED ORDER — DEXAMETHASONE SODIUM PHOSPHATE 100 MG/10ML IJ SOLN
10.0000 mg | Freq: Once | INTRAMUSCULAR | Status: AC
  Administered 2013-10-01: 10 mg via INTRAMUSCULAR

## 2013-10-01 MED ORDER — PROMETHAZINE-CODEINE 6.25-10 MG/5ML PO SYRP: 5.0000 mL | ORAL_SOLUTION | Freq: Four times a day (QID) | ORAL | Status: DC | PRN

## 2013-10-01 NOTE — Progress Notes (Signed)
   Subjective:    Patient ID: Andrea Owens, female    DOB: [birth year], 38 y.o.   MRN: [id number]  Sinus Problem This is a new problem. Episode onset: friday. The problem has been gradually worsening since onset. The maximum temperature recorded prior to her arrival was 100 - 100.9 F. The fever has been present for 1 to 2 days. Associated symptoms include chills, congestion, coughing, headaches, a hoarse voice, sinus pressure, sneezing, a sore throat and swollen glands. Pertinent negatives include no diaphoresis, ear pain, neck pain or shortness of breath. Past treatments include acetaminophen. The treatment provided mild relief.      Review of Systems  Constitutional: Positive for chills. Negative for diaphoresis.  HENT: Positive for congestion, hoarse voice, postnasal drip, sinus pressure, sneezing and sore throat. Negative for ear pain.   Respiratory: Positive for cough. Negative for chest tightness, shortness of breath and wheezing.   Cardiovascular: Negative.   Gastrointestinal: Negative.   Genitourinary: Negative.   Musculoskeletal: Negative for neck pain.  Neurological: Positive for headaches.       Objective:   Physical Exam  Constitutional: She appears well-developed and well-nourished.  HENT:  Head: Normocephalic and atraumatic.  Right Ear: External ear normal.  Nose: Right sinus exhibits maxillary sinus tenderness. Right sinus exhibits no frontal sinus tenderness. Left sinus exhibits maxillary sinus tenderness. Left sinus exhibits no frontal sinus tenderness.  Eyes: Conjunctivae and EOM are normal.  Neck: Normal range of motion. Neck supple.  Cardiovascular: Normal rate, regular rhythm, normal heart sounds and intact distal pulses.   Pulmonary/Chest: Effort normal and breath sounds normal. No respiratory distress. She has no wheezes.  Abdominal: Soft. Bowel sounds are normal.  Lymphadenopathy:    She has cervical adenopathy.  Skin: Skin is warm and dry.      Assessment  & Plan:  Acute maxillary sinusitis - Plan: sulfamethoxazole-trimethoprim (BACTRIM DS) 800-160 MG per tablet, promethazine-codeine (PHENERGAN WITH CODEINE) 6.25-10 MG/5ML syrup, dexamethasone (DECADRON) injection 10 mg

## 2013-10-01 NOTE — Patient Instructions (Signed)
The majority of colds are caused by viruses and do not require antibiotics. Please read the rest of this hand out to learn more about the common cold and what you can do to help yourself as well as help prevent the over use of antibiotics.   COMMON COLD SIGNS AND SYMPTOMS - The common cold usually causes nasal congestion, runny nose, and sneezing. A sore throat may be present on the first day but usually resolves quickly. If a cough occurs, it generally develops on about the fourth or fifth day of symptoms, typically when congestion and runny nose are resolving  COMMON COLD COMPLICATIONS - In most cases, colds do not cause serious illness or complications. Most colds last for three to seven days, although many people continue to have symptoms (coughing, sneezing, congestion) for up to two weeks.  One of the more common complications is sinusitis, which is usually caused by viruses and rarely (about 2 percent of the time) by bacteria. Having thick or yellow to green-colored nasal discharge does not mean that bacterial sinusitis has developed; discolored nasal discharge is a normal phase of the common cold.  Lower respiratory infections, such as pneumonia or bronchitis, may develop following a cold.  Infection of the middle ear, or otitis media, can accompany or follow a cold.  COMMON COLD TREATMENT - There is no specific treatment for the viruses that cause the common cold. Most treatments are aimed at relieving some of the symptoms of the cold, but do not shorten or cure the cold. Antibiotics are not useful for treating the common cold; antibiotics are only used to treat illnesses caused by bacteria, not viruses. Unnecessary use of antibiotics for the treatment of the common cold can cause allergic reactions, diarrhea, or other gastrointestinal symptoms in some patients.  The symptoms of a cold will resolve over time, even without any treatment. People with underlying medical conditions and those who use  other over-the-counter or prescription medications should speak with their healthcare provider or pharmacist to ensure that it is safe to use these treatments. The following are treatments that may reduce the symptoms caused by the common cold.  Nasal congestion - Decongestants are good for nasal congestion- if you feel very stuffy but no mucus is coming out, this is the medication that will help you the most.  Pseudoephedrine is a decongestant that can improve nasal congestion. Although a prescription is not required, drugstores in the [place] keep pseudoephedrine behind the counter, so it must be requested from a pharmacist. If you have a heart condition or high blood pressure please use Coricidin BPH instead.   Runny nose - Antihistamines such as diphenhydramine (Benadryl), certazine (Zyrtec) which are best taking at night because they can make you tired OR loratadine (Claritin),  fexafinadine (Allegra) help with a runny nose.   Nasal sprays such an oxymetazoline (Afrin and others) may also give temporary relief of nasal congestion. However, these sprays should never be used for more than two to three days; use for more than three days use can worsen congestion.  Nasocort is now over the counter and can help decrease a runny nose. Please stop the medication if you have blurry vision or nose bleeds.   Sore throat and headache - Sore throat and headache are best treated with a mild pain reliever such as acetaminophen (Tylenol) or a non-steroidal anti-inflammatory agent such as ibuprofen or naproxen (Motrin or Aleve). These medications should be taken with food to prevent stomach problems. As well as  gargling with warm water and salt.   Cough - Common cough medicine ingredients include guaifenesin and dextromethorphan; these are often combined with other medications in over-the-counter cold formulas. Often a cough is worse at night or first in the morning due to post nasal drip from you nose. You can  try to sleep at an angle to decrease a cough.   Alternative treatments - Heated, humidified air can improve symptoms of nasal congestion and runny nose, and causes few to no side effects. A number of alternative products, including vitamin C, doubling up on your vitamin D and herbal products such as echinacea, may help. Certain products, such as nasal gels that contain zinc (eg, Zicam), have been associated with a permanent loss of smell.  Antibiotics - Antibiotics should not be used to treat an uncomplicated common cold. As noted above, colds are caused by viruses. Antibiotics treat bacterial, not viral infections. Some viruses that cause the common cold can also depress the immune system or cause swelling in the lining of the nose or airways; this can, in turn, lead to a bacterial infection. Often you need to give your body 7 days to fight off a common cold while treating the symptoms with the medications listed above. If after 7 days your symptoms are not improving, you are getting worse, you have shortness of breath, chest pain, a fever of over 103 you should seek medical help immediately.   PREVENTION IS THE BEST MEDICINE - Hand washing is an essential and highly effective way to prevent the spread of infection.  Alcohol-based hand rubs are a good alternative for disinfecting hands if a sink is not available.  Hands should be washed before preparing food and eating and after coughing, blowing the nose, or sneezing. While it is not always possible to limit contact with people who may be infected with a cold, touching the eyes, nose, or mouth after direct contact should be avoided when possible. Sneezing/coughing into the sleeve of one's clothing (at the inner elbow) is another means of containing sprays of saliva and secretions and does not contaminate the hands.

## 2014-02-03 ENCOUNTER — Other Ambulatory Visit: Payer: Self-pay | Admitting: Physician Assistant

## 2014-03-21 ENCOUNTER — Other Ambulatory Visit: Payer: Self-pay | Admitting: Physician Assistant

## 2014-05-08 ENCOUNTER — Encounter: Payer: Self-pay | Admitting: Physician Assistant

## 2014-05-08 ENCOUNTER — Ambulatory Visit (INDEPENDENT_AMBULATORY_CARE_PROVIDER_SITE_OTHER): Payer: 59 | Admitting: Physician Assistant

## 2014-05-08 VITALS — BP 146/88 | HR 72 | Temp 99.8°F | Resp 18 | Ht 64.0 in | Wt 158.0 lb

## 2014-05-08 DIAGNOSIS — R0982 Postnasal drip: Secondary | ICD-10-CM

## 2014-05-08 DIAGNOSIS — J029 Acute pharyngitis, unspecified: Secondary | ICD-10-CM

## 2014-05-08 MED ORDER — PROMETHAZINE-DM 6.25-15 MG/5ML PO SYRP: 5.0000 mL | ORAL_SOLUTION | Freq: Four times a day (QID) | ORAL | Status: DC | PRN

## 2014-05-08 MED ORDER — FLUTICASONE PROPIONATE 50 MCG/ACT NA SUSP: 2.0000 | Freq: Every day | NASAL | Status: DC

## 2014-05-08 NOTE — Patient Instructions (Signed)
Take the Promethazine- DM as prescribed for congestion and cough. Take Flonase as prescribed. -Please take Tylenol for pain. -Acetaminiphen 391m orally every 4-6 hours for pain.  Max: 10 per day -Try steam showers to open your nasal passages.  Drink lots of water to stay hydrated and to thin mucous. -I sent in prescription for Flonase to help the inflammation.  Take 2 sprays in each nostril at bedtime.  Make sure you spray towards the outside of each nostril, hold nose close and tilt head back.  This will help the medication get into your sinuses.  If you do not like this medication, then use saline nasal sprays same directions as above for Flonase.  If you are not better in 7-10 days, then call the office and I will send in antibiotic.  Pharyngitis Pharyngitis is redness, pain, and swelling (inflammation) of your pharynx.  CAUSES  Pharyngitis is usually caused by infection. Most of the time, these infections are from viruses (viral) and are part of a cold. However, sometimes pharyngitis is caused by bacteria (bacterial). Pharyngitis can also be caused by allergies. Viral pharyngitis may be spread from person to person by coughing, sneezing, and personal items or utensils (cups, forks, spoons, toothbrushes). Bacterial pharyngitis may be spread from person to person by more intimate contact, such as kissing.  SIGNS AND SYMPTOMS  Symptoms of pharyngitis include:   Sore throat.   Tiredness (fatigue).   Low-grade fever.   Headache.  Joint pain and muscle aches.  Skin rashes.  Swollen lymph nodes.  Plaque-like film on throat or tonsils (often seen with bacterial pharyngitis). DIAGNOSIS  Your health care provider will ask you questions about your illness and your symptoms. Your medical history, along with a physical exam, is often all that is needed to diagnose pharyngitis. Sometimes, a rapid strep test is done. Other lab tests may also be done, depending on the suspected cause.   TREATMENT  Viral pharyngitis will usually get better in 3-4 days without the use of medicine. Bacterial pharyngitis is treated with medicines that kill germs (antibiotics).  HOME CARE INSTRUCTIONS   Drink enough water and fluids to keep your urine clear or pale yellow.   Only take over-the-counter or prescription medicines as directed by your health care provider:   If you are prescribed antibiotics, make sure you finish them even if you start to feel better.   Do not take aspirin.   Get lots of rest.   Gargle with 8 oz of salt water ( tsp of salt per 1 qt of water) as often as every 1-2 hours to soothe your throat.   Throat lozenges (if you are not at risk for choking) or sprays may be used to soothe your throat. SEEK MEDICAL CARE IF:   You have large, tender lumps in your neck.  You have a rash.  You cough up green, yellow-brown, or bloody spit. SEEK IMMEDIATE MEDICAL CARE IF:   Your neck becomes stiff.  You drool or are unable to swallow liquids.  You vomit or are unable to keep medicines or liquids down.  You have severe pain that does not go away with the use of recommended medicines.  You have trouble breathing (not caused by a stuffy nose). MAKE SURE YOU:   Understand these instructions.  Will watch your condition.  Will get help right away if you are not doing well or get worse. Document Released: 06/28/2005 Document Revised: 04/18/2013 Document Reviewed: 03/05/2013 [facility] Information 2015 [place] This  information is not intended to replace advice given to you by your health care provider. Make sure you discuss any questions you have with your health care provider.

## 2014-05-08 NOTE — Progress Notes (Signed)
Subjective:    Patient ID: Andrea Owens, female    DOB: 04-14-1976, 38 y.o.   MRN: [id number]  Sore Throat  This is a new problem. Episode onset: 2 days ago. The problem has been unchanged. The pain is worse on the right side. Maximum temperature: Patient has low grade fever of 99.8. The pain is at a severity of 7/10. The pain is mild. Associated symptoms include swollen glands and trouble swallowing. Pertinent negatives include no drooling, hoarse voice, plugged ear sensation, shortness of breath or stridor. Associated symptoms comments: Patient states she feels like the infection is coming from her tooth on the upper right side and states the dental pain is located there too.  She reports nausea a couple of times with no vomiting.. Exposure to: No sick contacts. Treatments tried: Taking Tylenol. The treatment provided no relief.   Review of Systems  Constitutional: Positive for chills and fatigue.  HENT: Positive for postnasal drip and trouble swallowing. Negative for drooling, hoarse voice and sinus pressure.        Dental pain in right upper.  Eyes: Negative.   Respiratory: Negative for chest tightness, shortness of breath and stridor.   Cardiovascular:       Chest pain with deep breath on left side sometimes.  Genitourinary: Negative.   Skin: Negative.   Psychiatric/Behavioral: Negative.    Past Medical History  Diagnosis Date  . Hypertension   . Hyperlipidemia   . GERD (gastroesophageal reflux disease)   . Allergy   . Vitamin D deficiency   . Endometriosis   . Migraine   . Prediabetes    Current Outpatient Prescriptions on File Prior to Visit  Medication Sig Dispense Refill  . cholecalciferol (VITAMIN D) 1000 UNITS tablet Take 2,000 Units by mouth daily.      . famotidine (PEPCID) 40 MG tablet Take 40 mg by mouth daily.      Marland Kitchen levocetirizine (XYZAL) 5 MG tablet Take 1 tablet by mouth  every evening  90 tablet  0  . loratadine (CLARITIN) 10 MG tablet Take 10 mg by mouth  daily.      . Magnesium 250 MG TABS Take 250 mg by mouth daily.      . metoprolol tartrate (LOPRESSOR) 25 MG tablet Take 1 tablet (25 mg total) by mouth daily.  90 tablet  1  . nitroGLYCERIN (NITROSTAT) 0.3 MG SL tablet Place 0.3 mg under the tongue every 5 (five) minutes as needed for chest pain.      . simvastatin (ZOCOR) 40 MG tablet Take 1 tablet by mouth  daily  90 tablet  1   No current facility-administered medications on file prior to visit.   Allergies  Allergen Reactions  . Penicillins     rash   BP 146/88  Pulse 72  Temp(Src) 99.8 F (37.7 C) (Temporal)  Resp 18  Ht 5' 4"  (1.626 m)  Wt 158 lb (71.668 kg)  BMI 27.11 kg/m2  LMP 04/20/2014    Objective:   Physical Exam  Constitutional: She is oriented to person, place, and time. She appears well-developed and well-nourished. She has a sickly appearance. No distress.  HENT:  Head: Normocephalic.  Right Ear: External ear and ear canal normal. No drainage, swelling or tenderness. Tympanic membrane is bulging. Tympanic membrane is not injected, not perforated, not erythematous and not retracted. A middle ear effusion is present.  Left Ear: Tympanic membrane, external ear and ear canal normal. No drainage, swelling or tenderness. Tympanic membrane is  not injected, not perforated, not erythematous, not retracted and not bulging.  No middle ear effusion.  Nose: Rhinorrhea present. Right sinus exhibits maxillary sinus tenderness. Right sinus exhibits no frontal sinus tenderness. Left sinus exhibits no maxillary sinus tenderness and no frontal sinus tenderness.  Mouth/Throat: Uvula is midline and mucous membranes are normal. Mucous membranes are not pale and not dry. She does not have dentures. No oral lesions. No trismus in the jaw. No dental abscesses, uvula swelling, lacerations or dental caries. Posterior oropharyngeal erythema present. No oropharyngeal exudate, posterior oropharyngeal edema or tonsillar abscesses.    Turbinates  are erythematous bilaterally. Upper right molars had looked like they had work done.  Gums and cheeks were non-erythematous, not swollen and not tender upon palpation bilaterally.    Eyes: Conjunctivae are normal. Pupils are equal, round, and reactive to light. Right eye exhibits no discharge. Left eye exhibits no discharge. No scleral icterus.  Neck: Trachea normal and normal range of motion. Neck supple. No tracheal tenderness present. No tracheal deviation present.  Cardiovascular: Normal rate, regular rhythm, S1 normal, S2 normal, normal heart sounds, intact distal pulses and normal pulses.  Exam reveals no gallop, no distant heart sounds and no friction rub.   No murmur heard. Pulmonary/Chest: Effort normal and breath sounds normal. Not tachypneic and not bradypneic. No respiratory distress. She has no wheezes. She has no rales. She exhibits no tenderness.  Abdominal: Soft. Bowel sounds are normal. There is no tenderness. There is no rebound and no guarding.  Lymphadenopathy:       Head (right side): No submental, no submandibular, no tonsillar, no preauricular, no posterior auricular and no occipital adenopathy present.       Head (left side): No submental, no submandibular, no tonsillar, no preauricular, no posterior auricular and no occipital adenopathy present.    She has no cervical adenopathy.       Right: No supraclavicular adenopathy present.       Left: No supraclavicular adenopathy present.  Tonsils were at +2 station without exudate and non-erythematous.  Tonsils were tender upon palpation bilaterally.  Neurological: She is alert and oriented to person, place, and time.  Skin: Skin is warm, dry and intact. No rash noted.  Psychiatric: She has a normal mood and affect. Her speech is normal and behavior is normal. Judgment and thought content normal.      Assessment & Plan:  1. Acute pharyngitis, unspecified pharyngitis type Take the Promethazine-DM as prescribed for congestion  and cough. Take Flonase as prescribed for nasal congestion. Take Xyzal for help with congestion. -Please take Tylenol for pain--Acetaminiphen 355m orally every 4-6 hours for pain.  Max: 10 per day -Try steam showers to open your nasal passages.  Drink lots of water to stay hydrated and to thin mucous. -I sent in prescription for Flonase to help the inflammation.  Take 2 sprays in each nostril at bedtime.  Make sure you spray towards the outside of each nostril, hold nose close and tilt head back.  This will help the medication get into your sinuses.  If you do not like this medication, then use saline nasal sprays same directions as above for Flonase. - To open your eustachian tubes to drain them, try holding your nose and drinking water. - promethazine-dextromethorphan (PROMETHAZINE-DM) 6.25-15 MG/5ML syrup; Take 5 mLs by mouth 4 (four) times daily as needed for cough.  Dispense: 180 mL; Refill: 0 - fluticasone (FLONASE) 50 MCG/ACT nasal spray; Place 2 sprays into both nostrils at bedtime.  Dispense: 16 g; Refill: 1  2. Post-nasal drip Take Flonase as prescribed and Promerthazine-DM as prescribed. - fluticasone (FLONASE) 50 MCG/ACT nasal spray; Place 2 sprays into both nostrils at bedtime.  Dispense: 16 g; Refill: 1  - All medications were discussed and SE's explained.  If you are not better in 7-10 days, then call the office and I will send in antibiotic (Per patient Bactrim works best for her).  Iyonna Rish, Stephani Police, PA-C 4:01 PM [facility]

## 2014-06-16 ENCOUNTER — Other Ambulatory Visit: Payer: Self-pay | Admitting: Physician Assistant

## 2014-07-18 ENCOUNTER — Other Ambulatory Visit: Payer: Self-pay | Admitting: Physician Assistant

## 2014-08-01 ENCOUNTER — Telehealth: Payer: Self-pay | Admitting: Internal Medicine

## 2014-08-01 NOTE — Telephone Encounter (Signed)
Left message, Due to inclement weather we are rescheduling Friday 08-02-14 appointments.  Please call the office.  Thank you, Katrina Judeth [facility] [phone] Fax [phone]

## 2014-08-02 ENCOUNTER — Encounter: Payer: Self-pay | Admitting: Physician Assistant

## 2014-08-22 ENCOUNTER — Telehealth: Payer: Self-pay | Admitting: Physician Assistant

## 2014-08-22 DIAGNOSIS — R0982 Postnasal drip: Secondary | ICD-10-CM

## 2014-08-22 DIAGNOSIS — J029 Acute pharyngitis, unspecified: Secondary | ICD-10-CM

## 2014-08-22 MED ORDER — SULFAMETHOXAZOLE-TRIMETHOPRIM 800-160 MG PO TABS: 1.0000 | ORAL_TABLET | Freq: Two times a day (BID) | ORAL | Status: DC

## 2014-08-22 MED ORDER — PREDNISONE 20 MG PO TABS: ORAL_TABLET | ORAL | Status: DC

## 2014-08-22 MED ORDER — FLUTICASONE PROPIONATE 50 MCG/ACT NA SUSP: 2.0000 | Freq: Every day | NASAL | Status: AC

## 2014-08-22 NOTE — Telephone Encounter (Signed)
Patient aware of Amanda's orders.  Patient states she can not tolerate prednisone but will try nasal spray and allergy pill.  If no relief with symptoms then will f/u for ov.

## 2014-08-22 NOTE — Telephone Encounter (Signed)
EVISIT TELEPHONE Patient called the office for a telephone visit complaining of possible sinusitis. Symptoms include no  fever, purulent nasal discharge, sinus pressure, sore throat and headache. Onset of symptoms was 4 days ago, and has been gradually worsening since that time. Treatment to date: tylenol sinus.  PLAN:  Discussed the diagnosis and treatment of sinusitis. Discussed the importance of avoiding unnecessary antibiotic therapy. Suggested symptomatic OTC remedies. Nasal saline spray for congestion. TMP-SMX DS per orders. Nasal steroids per orders. Follow up as needed. Call in 7 days if symptoms aren't resolving.

## 2014-08-30 ENCOUNTER — Other Ambulatory Visit: Payer: Self-pay

## 2014-08-30 MED ORDER — METOPROLOL TARTRATE 25 MG PO TABS: ORAL_TABLET | ORAL | Status: DC

## 2014-09-04 ENCOUNTER — Encounter: Payer: Self-pay | Admitting: Physician Assistant

## 2014-09-04 ENCOUNTER — Encounter (INDEPENDENT_AMBULATORY_CARE_PROVIDER_SITE_OTHER): Payer: Self-pay

## 2014-09-04 ENCOUNTER — Ambulatory Visit (INDEPENDENT_AMBULATORY_CARE_PROVIDER_SITE_OTHER): Payer: BLUE CROSS/BLUE SHIELD | Admitting: Physician Assistant

## 2014-09-04 VITALS — BP 110/78 | HR 76 | Temp 98.1°F | Resp 16 | Ht 64.0 in | Wt 154.0 lb

## 2014-09-04 DIAGNOSIS — Z0000 Encounter for general adult medical examination without abnormal findings: Secondary | ICD-10-CM

## 2014-09-04 DIAGNOSIS — Z9109 Other allergy status, other than to drugs and biological substances: Secondary | ICD-10-CM

## 2014-09-04 DIAGNOSIS — I10 Essential (primary) hypertension: Secondary | ICD-10-CM

## 2014-09-04 DIAGNOSIS — M5441 Lumbago with sciatica, right side: Secondary | ICD-10-CM

## 2014-09-04 DIAGNOSIS — R7303 Prediabetes: Secondary | ICD-10-CM

## 2014-09-04 DIAGNOSIS — R6889 Other general symptoms and signs: Secondary | ICD-10-CM

## 2014-09-04 DIAGNOSIS — Z0001 Encounter for general adult medical examination with abnormal findings: Secondary | ICD-10-CM

## 2014-09-04 DIAGNOSIS — G43809 Other migraine, not intractable, without status migrainosus: Secondary | ICD-10-CM

## 2014-09-04 DIAGNOSIS — G43909 Migraine, unspecified, not intractable, without status migrainosus: Secondary | ICD-10-CM | POA: Insufficient documentation

## 2014-09-04 DIAGNOSIS — E559 Vitamin D deficiency, unspecified: Secondary | ICD-10-CM

## 2014-09-04 DIAGNOSIS — E785 Hyperlipidemia, unspecified: Secondary | ICD-10-CM

## 2014-09-04 DIAGNOSIS — Z79899 Other long term (current) drug therapy: Secondary | ICD-10-CM

## 2014-09-04 DIAGNOSIS — M545 Low back pain, unspecified: Secondary | ICD-10-CM | POA: Insufficient documentation

## 2014-09-04 DIAGNOSIS — N809 Endometriosis, unspecified: Secondary | ICD-10-CM

## 2014-09-04 DIAGNOSIS — K2100 Gastro-esophageal reflux disease with esophagitis, without bleeding: Secondary | ICD-10-CM

## 2014-09-04 DIAGNOSIS — K824 Cholesterolosis of gallbladder: Secondary | ICD-10-CM

## 2014-09-04 LAB — CBC WITH DIFFERENTIAL/PLATELET
BASOS PCT: 0 % (ref 0–1)
Basophils Absolute: 0 10*3/uL (ref 0.0–0.1)
Eosinophils Absolute: 0.1 10*3/uL (ref 0.0–0.7)
Eosinophils Relative: 1 % (ref 0–5)
HEMATOCRIT: 38.5 % (ref 36.0–46.0)
Hemoglobin: 12.9 g/dL (ref 12.0–15.0)
Lymphocytes Relative: 32 % (ref 12–46)
Lymphs Abs: 3 10*3/uL (ref 0.7–4.0)
MCH: 31.1 pg (ref 26.0–34.0)
MCHC: 33.5 g/dL (ref 30.0–36.0)
MCV: 92.8 fL (ref 78.0–100.0)
MONOS PCT: 8 % (ref 3–12)
MPV: 12.3 fL (ref 8.6–12.4)
Monocytes Absolute: 0.8 10*3/uL (ref 0.1–1.0)
NEUTROS ABS: 5.6 10*3/uL (ref 1.7–7.7)
NEUTROS PCT: 59 % (ref 43–77)
Platelets: 219 10*3/uL (ref 150–400)
RBC: 4.15 MIL/uL (ref 3.87–5.11)
RDW: 13 % (ref 11.5–15.5)
WBC: 9.5 10*3/uL (ref 4.0–10.5)

## 2014-09-04 MED ORDER — SIMVASTATIN 40 MG PO TABS: ORAL_TABLET | ORAL | Status: DC

## 2014-09-04 MED ORDER — OMEPRAZOLE 40 MG PO CPDR: 40.0000 mg | DELAYED_RELEASE_CAPSULE | Freq: Every day | ORAL | Status: DC

## 2014-09-04 MED ORDER — LEVOCETIRIZINE DIHYDROCHLORIDE 5 MG PO TABS: ORAL_TABLET | ORAL | Status: DC

## 2014-09-04 MED ORDER — METOPROLOL TARTRATE 25 MG PO TABS: ORAL_TABLET | ORAL | Status: DC

## 2014-09-04 NOTE — Patient Instructions (Addendum)
OB/GYN [phone].  Encourage you to get the 3D Mammogram  The 3D Mammogram is much more specific and sensitive to pick up breast cancer. For women with fibrocystic breast or lumpy breast it can be hard to determine if it is cancer or not but the 3D mammogram is able to tell this difference which cuts back on unneeded additional tests or scary call backs.   Your ears and sinuses are connected by the eustachian tube. When your sinuses are inflamed, this can close off the tube and cause fluid to collect in your middle ear. This can then cause dizziness, popping, clicking, ringing, and echoing in your ears. This is often NOT an infection and does NOT require antibiotics, it is caused by inflammation so the treatments help the inflammation. This can take a long time to get better so please be patient.  Here are things you can do to help with this: - Try the Flonase or Nasonex. Remember to spray each nostril twice towards the outer part of your eye.  Do not sniff but instead pinch your nose and tilt your head back to help the medicine get into your sinuses.  The best time to do this is at bedtime.Stop if you get blurred vision or nose bleeds.  -While drinking fluids, pinch and hold nose close and swallow, to help open eustachian tubes to drain fluid behind ear drums. -Please pick one of the over the counter allergy medications below and take it once daily for allergies.  It will also help with fluid behind ear drums. Claritin or loratadine cheapest but likely the weakest  Zyrtec or certizine at night because it can make you sleepy The strongest is allegra or fexafinadine  Cheapest at [employer], [employer], [employer] -can use decongestant over the counter, please do not use if you have high blood pressure or certain heart conditions.   if worsening HA, changes vision/speech, imbalance, weakness go to the ER   Back Exercises Back exercises help treat and prevent back injuries. The goal of back exercises is  to increase the strength of your abdominal and back muscles and the flexibility of your back. These exercises should be started when you no longer have back pain. Back exercises include:  Pelvic Tilt. Lie on your back with your knees bent. Tilt your pelvis until the lower part of your back is against the floor. Hold this position 5 to 10 sec and repeat 5 to 10 times.  Knee to Chest. Pull first 1 knee up against your chest and hold for 20 to 30 seconds, repeat this with the other knee, and then both knees. This may be done with the other leg straight or bent, whichever feels better.  Sit-Ups or Curl-Ups. Bend your knees 90 degrees. Start with tilting your pelvis, and do a partial, slow sit-up, lifting your trunk only 30 to 45 degrees off the floor. Take at least 2 to 3 seconds for each sit-up. Do not do sit-ups with your knees out straight. If partial sit-ups are difficult, simply do the above but with only tightening your abdominal muscles and holding it as directed.  Hip-Lift. Lie on your back with your knees flexed 90 degrees. Push down with your feet and shoulders as you raise your hips a couple inches off the floor; hold for 10 seconds, repeat 5 to 10 times.  Back arches. Lie on your stomach, propping yourself up on bent elbows. Slowly press on your hands, causing an arch in your low back. Repeat 3  to 5 times. Any initial stiffness and discomfort should lessen with repetition over time.  Shoulder-Lifts. Lie face down with arms beside your body. Keep hips and torso pressed to floor as you slowly lift your head and shoulders off the floor. Do not overdo your exercises, especially in the beginning. Exercises may cause you some mild back discomfort which lasts for a few minutes; however, if the pain is more severe, or lasts for more than 15 minutes, do not continue exercises until you see your caregiver. Improvement with exercise therapy for back problems is slow.  See your caregivers for assistance  with developing a proper back exercise program. Document Released: 08/05/2004 Document Revised: 09/20/2011 Document Reviewed: 04/29/2011 [facility] Patient Information 2015 [place], [place]. This information is not intended to replace advice given to you by your health care provider. Make sure you discuss any questions you have with your health care provider.  Nexium/protonix/prilosec are called PPI's, they are great at healing your stomach but should only be taken for a short period of time.   Studies are showing that taken for a long time it can increase the risk of osteoporosis (weakening of your bones), pneumonia, low magnesium, restless legs, Cdiff (infection that causes diarrhea), and most recently kidney disease/insufficiency.  Due to this information we want to try to stop the PPI but if you try to stop it abruptly this can cause rebound acid and worsening symptoms.   So this is how we want you to get off the PPI: - Start taking the nexium/protonix/prilosec or which every PPI you are on every other day for 2 week while starting to take pepcid or zantac (generic is fine) 2 x a day - then decrease the PPI to every 3 days for 2 weeks and then stop while continuing on the zantac or pepcid twice daily. - then you can try once at night for 2 weeks - you can continue on this once at night or stop all together - Avoid alcohol, spicy foods, NSAIDS (aleve, ibuprofen) at this time. See foods below.   Food Choices for Gastroesophageal Reflux Disease When you have gastroesophageal reflux disease (GERD), the foods you eat and your eating habits are very important. Choosing the right foods can help ease the discomfort of GERD. WHAT GENERAL GUIDELINES DO I NEED TO FOLLOW?  Choose fruits, vegetables, whole grains, low-fat dairy products, and low-fat meat, fish, and poultry.  Limit fats such as oils, salad dressings, butter, nuts, and avocado.  Keep a food diary to identify foods that cause  symptoms.  Avoid foods that cause reflux. These may be different for different people.  Eat frequent small meals instead of three large meals each day.  Eat your meals slowly, in a relaxed setting.  Limit fried foods.  Cook foods using methods other than frying.  Avoid drinking alcohol.  Avoid drinking large amounts of liquids with your meals.  Avoid bending over or lying down until 2-3 hours after eating. WHAT FOODS ARE NOT RECOMMENDED? The following are some foods and drinks that may worsen your symptoms: Vegetables Tomatoes. Tomato juice. Tomato and spaghetti sauce. Chili peppers. Onion and garlic. Horseradish. Fruits Oranges, grapefruit, and lemon (fruit and juice). Meats High-fat meats, fish, and poultry. This includes hot dogs, ribs, ham, sausage, salami, and bacon. Dairy Whole milk and chocolate milk. Sour cream. Cream. Butter. Ice cream. Cream cheese.  Beverages Coffee and tea, with or without caffeine. Carbonated beverages or energy drinks. Condiments Hot sauce. Barbecue sauce.  Sweets/Desserts Chocolate and  cocoa. Donuts. Peppermint and spearmint. Fats and Oils High-fat foods, including [place] fries and potato chips. Other Vinegar. Strong spices, such as black pepper, white pepper, red pepper, cayenne, curry powder, cloves, ginger, and chili powder.

## 2014-09-04 NOTE — Progress Notes (Signed)
Complete Physical  Assessment and Plan: Hypertension- at goal  Hyperlipidemia- cont diet and exercise, check level  Allergy-Continue OTC allergy pills  Vitamin D deficiency- check level  Endometriosis- follow up OB/GYN  Migraine- controlled  Prediabetes- cont diet and exercise, check level  Thrombocytopenia- check CBC GERD- switch PPI to H2 Leg pain- normal Xray lumbar in 2013-  treat conservatively, declines referral at this time PAP- due now- prefers done at OB/GYN.  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     Discussed med's effects and SE's. Screening labs and tests as requested with regular follow-up as recommended.  HPI Patient presents for complete physical.   Patient's blood pressure has been controlled at home. Patient denies chest pain, shortness of breath, dizziness. BP: 110/78 mmHg  Patient's cholesterol is diet controlled. They are on zocor and denies myalgias. The patient's cholesterol last visit was Lab Results  Component Value Date   CHOL 145 08/01/2013   HDL 38* 08/01/2013   LDLCALC 84 08/01/2013   TRIG 114 08/01/2013   CHOLHDL 3.8 08/01/2013   The patient has been working on diet and exercise for prediabetes, denies changes in vision, polys, and paresthesias. Last A1C in office  Lab Results  Component Value Date   HGBA1C 5.8* 08/01/2013   Thrombocytopenia with platelets at Lab Results  Component Value Date   WBC 11.1* 08/01/2013   HGB 14.2 08/01/2013   HCT 41.5 08/01/2013   MCV 91.2 08/01/2013   PLT 158 08/01/2013   Saw Dr. Hilarie Fredrickson in March 2015 after failing hpylori treatment, had EGD and tested + hpylori and treated again. She is doing better with GERD and uses prilosec occ.  She was treated for recent sinusitis, continues some sinus pressure.  She has continuing lower back pain, now complaining of right leg  pain posterior radiation to her heel. Worse if she sits for a long time and then gets up.  States she had fractured pelvis back in 1992. Normal lumbar Xray 2013. Tylenol helps some.   Current Medications:  Current Outpatient Prescriptions on File Prior to Visit  Medication Sig Dispense Refill  . cholecalciferol (VITAMIN D) 1000 UNITS tablet Take 2,000 Units by mouth daily.    . Cyanocobalamin (VITAMIN B 12 PO) Take by mouth daily.    . famotidine (PEPCID) 40 MG tablet Take 40 mg by mouth daily.    . fluticasone (FLONASE) 50 MCG/ACT nasal spray Place 2 sprays into both nostrils at bedtime. 16 g 1  . levocetirizine (XYZAL) 5 MG tablet Take 1 tablet by mouth  every evening 90 tablet 99  . loratadine (CLARITIN) 10 MG tablet Take 10 mg by mouth daily.    . Magnesium 250 MG TABS Take 250 mg by mouth daily.    . metoprolol tartrate (LOPRESSOR) 25 MG tablet Take 1 tablet by mouth  daily 30 tablet 0  . simvastatin (ZOCOR) 40 MG tablet Take 1 tablet by mouth  daily 90 tablet 1   No current facility-administered medications on file prior to visit.   Health Maintenance:  Tetanus: 07/2012 Pneumovax: N/A Flu vaccine: declines  Zostavax: N/A LMP: NOW Pap: 2013 Dr. Ubaldo Glassing, had an abnormal pap in 2011 but all normal since then. And Dr. Ubaldo Glassing has retired, getting new OB/GYN.  MGM: 07/2013 category C DEXA: N/A  Colonoscopy:N/A  EGD: 09/2013  Patient Care Team: Unk Pinto, MD as PCP -  General (Internal Medicine)  Allergies:  Allergies  Allergen Reactions  . Penicillins     rash   Medical History:  Past Medical History  Diagnosis Date  . Hypertension   . Hyperlipidemia   . GERD (gastroesophageal reflux disease)   . Allergy   . Vitamin D deficiency   . Endometriosis   . Migraine   . Prediabetes    Surgical History:  Past Surgical History  Procedure Laterality Date  . Refractive surgery  2004   Family History:  Family History  Problem Relation Age of Onset  . Hyperlipidemia  Mother   . Irritable bowel syndrome Mother   . Heart disease Father   . Hyperlipidemia Father   . Hypertension Father   . Breast cancer Maternal Aunt     [employer]  . Prostate cancer Maternal Grandfather   . Colon cancer Neg Hx   . Esophageal cancer Neg Hx   . Stomach cancer Neg Hx   . Rectal cancer Neg Hx    Social History:  History   Social History  . Marital Status: Married    Spouse Name: N/A  . Number of Children: 0  . Years of Education: N/A   Occupational History  . Manager in [employer]  . Smoking status: Current Every Day Smoker -- 0.50 packs/day for 20 years  . Smokeless tobacco: Never Used  . Alcohol Use: No  . Drug Use: No  . Sexual Activity: Not on file     Comment: on period   Other Topics Concern  . Not on file   Social History Narrative   ROS Review of Systems  Constitutional: Negative.   HENT: Positive for congestion. Negative for ear discharge, ear pain, hearing loss, nosebleeds, sore throat and tinnitus.        + TMJ, wears night guard  Respiratory: Negative.  Negative for stridor.   Cardiovascular: Negative.   Gastrointestinal: Negative.   Genitourinary: Negative.   Musculoskeletal: Positive for back pain. Negative for myalgias, joint pain, falls and neck pain.  Skin: Negative.   Neurological: Negative.  Negative for headaches.  Psychiatric/Behavioral: Negative.      Physical Exam: Estimated body mass index is 26.42 kg/(m^2) as calculated from the following:   Height as of this encounter: 5' 4"  (1.626 m).   Weight as of this encounter: 154 lb (69.854 kg). Filed Vitals:   09/04/14 1353  BP: 110/78  Pulse: 76  Temp: 98.1 F (36.7 C)  Resp: 16   General Appearance: Well nourished, in no apparent distress. Eyes: PERRLA, EOMs, conjunctiva no swelling or erythema, normal fundi and vessels. Sinuses: No Frontal/maxillary tenderness ENT/Mouth: Ext aud canals clear, normal light reflex with TMs  without erythema, bulging.  Good dentition. No erythema, swelling, or exudate on post pharynx. Tonsils not swollen or erythematous. Hearing normal. + TMJ tenderness on left side> right Neck: Supple, thyroid normal. No bruits Respiratory: Respiratory effort normal, BS equal bilaterally without rales, rhonchi, wheezing or stridor. Cardio: RRR without murmurs, rubs or gallops. Brisk peripheral pulses without edema.  Chest: symmetric, with normal excursions and percussion. Breasts: defer OB/GYN Abdomen: Soft, +BS. RUQ tenderness, no guarding, rebound, hernias, masses, or organomegaly. .  Lymphatics: Non tender without lymphadenopathy.  Genitourinary: defer Musculoskeletal: Full ROM all peripheral extremities,5/5 strength, and normal gait. + RSI tenderness, negative straight leg raise Skin: Warm, dry without rashes, lesions, ecchymosis.  Neuro: Cranial nerves intact, reflexes equal bilaterally. Normal muscle tone, no cerebellar  symptoms. Sensation intact.  Psych: Awake and oriented X 3, normal affect, Insight and Judgment appropriate.   EKG: defer    Vicie Mutters 2:13 PM

## 2014-09-05 LAB — HEMOGLOBIN A1C
Hgb A1c MFr Bld: 5.8 % — ABNORMAL HIGH (ref <5.7)
Mean Plasma Glucose: 120 mg/dL — ABNORMAL HIGH (ref <117)

## 2014-09-05 LAB — LIPID PANEL
CHOL/HDL RATIO: 4.3 ratio
Cholesterol: 146 mg/dL (ref 0–200)
HDL: 34 mg/dL — ABNORMAL LOW (ref >=46)
LDL CALC: 85 mg/dL (ref 0–99)
Triglycerides: 133 mg/dL (ref <150)
VLDL: 27 mg/dL (ref 0–40)

## 2014-09-05 LAB — BASIC METABOLIC PANEL WITH GFR
BUN: 10 mg/dL (ref 6–23)
CHLORIDE: 105 meq/L (ref 96–112)
CO2: 27 mEq/L (ref 19–32)
Calcium: 9.5 mg/dL (ref 8.4–10.5)
Creat: 0.64 mg/dL (ref 0.50–1.10)
GFR, Est African American: >89 mL/min
Glucose, Bld: 81 mg/dL (ref 70–99)
POTASSIUM: 4.3 meq/L (ref 3.5–5.3)
Sodium: 142 mEq/L (ref 135–145)

## 2014-09-05 LAB — URINALYSIS, ROUTINE W REFLEX MICROSCOPIC
Bilirubin Urine: NEGATIVE
Glucose, UA: NEGATIVE mg/dL
Hgb urine dipstick: NEGATIVE
KETONES UR: NEGATIVE mg/dL
LEUKOCYTES UA: NEGATIVE
NITRITE: NEGATIVE
Protein, ur: NEGATIVE mg/dL
UROBILINOGEN UA: 0.2 mg/dL (ref 0.0–1.0)
pH: 6.5 (ref 5.0–8.0)

## 2014-09-05 LAB — MAGNESIUM: Magnesium: 2.1 mg/dL (ref 1.5–2.5)

## 2014-09-05 LAB — HEPATIC FUNCTION PANEL
ALK PHOS: 68 U/L (ref 39–117)
ALT: 18 U/L (ref 0–35)
AST: 18 U/L (ref 0–37)
Albumin: 4.6 g/dL (ref 3.5–5.2)
BILIRUBIN INDIRECT: 0.2 mg/dL (ref 0.2–1.2)
BILIRUBIN TOTAL: 0.3 mg/dL (ref 0.2–1.2)
Bilirubin, Direct: 0.1 mg/dL (ref 0.0–0.3)
Total Protein: 6.8 g/dL (ref 6.0–8.3)

## 2014-09-05 LAB — MICROALBUMIN / CREATININE URINE RATIO
CREATININE, URINE: 22.6 mg/dL
Microalb, Ur: <0.2 mg/dL (ref <2.0)

## 2014-09-05 LAB — TSH: TSH: 1.439 u[IU]/mL (ref 0.350–4.500)

## 2014-09-05 LAB — VITAMIN D 25 HYDROXY (VIT D DEFICIENCY, FRACTURES): Vit D, 25-Hydroxy: 73 ng/mL (ref 30–100)

## 2014-09-05 LAB — INSULIN, FASTING: Insulin fasting, serum: 6.3 u[IU]/mL (ref 2.0–19.6)

## 2014-09-26 ENCOUNTER — Other Ambulatory Visit: Payer: Self-pay | Admitting: Obstetrics and Gynecology

## 2014-09-27 LAB — CYTOLOGY - PAP

## 2014-11-08 ENCOUNTER — Other Ambulatory Visit: Payer: Self-pay | Admitting: Physician Assistant

## 2014-11-08 ENCOUNTER — Telehealth: Payer: Self-pay | Admitting: Physician Assistant

## 2014-11-08 MED ORDER — PREDNISONE 20 MG PO TABS: ORAL_TABLET | ORAL | Status: DC

## 2014-11-08 NOTE — Telephone Encounter (Signed)
Patient calling with 3 days of sinus pressure, headache, low grade fever, sinus drainage. Likely viral, will send in prednisone continue allegra/flonase. If not better Monday can send in antibiotic.

## 2014-11-08 NOTE — Telephone Encounter (Signed)
Called patient and left message on machine with instructions per Vicie Mutters, PA

## 2015-03-13 HISTORY — PX: ABDOMINAL HYSTERECTOMY: SHX81

## 2015-03-23 ENCOUNTER — Other Ambulatory Visit: Payer: Self-pay | Admitting: Physician Assistant

## 2015-04-03 ENCOUNTER — Other Ambulatory Visit: Payer: Self-pay | Admitting: Obstetrics and Gynecology

## 2015-04-10 ENCOUNTER — Ambulatory Visit: Payer: Self-pay | Admitting: Physician Assistant

## 2015-04-14 ENCOUNTER — Encounter: Payer: Self-pay | Admitting: Physician Assistant

## 2015-04-14 ENCOUNTER — Ambulatory Visit (INDEPENDENT_AMBULATORY_CARE_PROVIDER_SITE_OTHER): Payer: BLUE CROSS/BLUE SHIELD | Admitting: Physician Assistant

## 2015-04-14 VITALS — BP 118/64 | HR 83 | Temp 97.7°F | Resp 14 | Ht 64.0 in | Wt 151.0 lb

## 2015-04-14 DIAGNOSIS — K2100 Gastro-esophageal reflux disease with esophagitis, without bleeding: Secondary | ICD-10-CM

## 2015-04-14 DIAGNOSIS — E785 Hyperlipidemia, unspecified: Secondary | ICD-10-CM | POA: Diagnosis not present

## 2015-04-14 DIAGNOSIS — R7303 Prediabetes: Secondary | ICD-10-CM

## 2015-04-14 DIAGNOSIS — I10 Essential (primary) hypertension: Secondary | ICD-10-CM | POA: Diagnosis not present

## 2015-04-14 DIAGNOSIS — M5441 Lumbago with sciatica, right side: Secondary | ICD-10-CM | POA: Diagnosis not present

## 2015-04-14 DIAGNOSIS — E559 Vitamin D deficiency, unspecified: Secondary | ICD-10-CM | POA: Diagnosis not present

## 2015-04-14 DIAGNOSIS — Z79899 Other long term (current) drug therapy: Secondary | ICD-10-CM | POA: Diagnosis not present

## 2015-04-14 LAB — CBC WITH DIFFERENTIAL/PLATELET
BASOS PCT: 0 % (ref 0–1)
Basophils Absolute: 0 10*3/uL (ref 0.0–0.1)
Eosinophils Absolute: 0.1 10*3/uL (ref 0.0–0.7)
Eosinophils Relative: 1 % (ref 0–5)
HEMATOCRIT: 36.2 % (ref 36.0–46.0)
HEMOGLOBIN: 12.4 g/dL (ref 12.0–15.0)
LYMPHS ABS: 1.8 10*3/uL (ref 0.7–4.0)
LYMPHS PCT: 16 % (ref 12–46)
MCH: 31.7 pg (ref 26.0–34.0)
MCHC: 34.3 g/dL (ref 30.0–36.0)
MCV: 92.6 fL (ref 78.0–100.0)
MONOS PCT: 9 % (ref 3–12)
MPV: 12.1 fL (ref 8.6–12.4)
Monocytes Absolute: 1 10*3/uL (ref 0.1–1.0)
NEUTROS ABS: 8.4 10*3/uL — AB (ref 1.7–7.7)
NEUTROS PCT: 74 % (ref 43–77)
Platelets: 294 10*3/uL (ref 150–400)
RBC: 3.91 MIL/uL (ref 3.87–5.11)
RDW: 13.8 % (ref 11.5–15.5)
WBC: 11.4 10*3/uL — ABNORMAL HIGH (ref 4.0–10.5)

## 2015-04-14 LAB — HEMOGLOBIN A1C
Hgb A1c MFr Bld: 5.6 % (ref <5.7)
MEAN PLASMA GLUCOSE: 114 mg/dL (ref <117)

## 2015-04-14 MED ORDER — METOPROLOL TARTRATE 25 MG PO TABS: 25.0000 mg | ORAL_TABLET | Freq: Every day | ORAL | Status: DC

## 2015-04-14 NOTE — Patient Instructions (Signed)
  Vitamin D goal is between 60-80  Please make sure that you are taking your Vitamin D as directed.   It is very important as a natural anti-inflammatory   helping hair, skin, and nails, as well as reducing stroke and heart attack risk.   It helps your bones and helps with mood.  It also decreases numerous cancer risks so please take it as directed.   Low Vit D is associated with a 200-300% higher risk for CANCER   and 200-300% higher risk for HEART   ATTACK  &  STROKE.    .....................................Marland Kitchen  It is also associated with higher death rate at younger ages,   autoimmune diseases like Rheumatoid arthritis, Lupus, Multiple Sclerosis.     Also many other serious conditions, like depression, Alzheimer's  Dementia, infertility, muscle aches, fatigue, fibromyalgia - just to name a few.  +++++++++++++++++++      Bad carbs also include fruit juice, alcohol, and sweet tea. These are empty calories that do not signal to your brain that you are full.   Please remember the good carbs are still carbs which convert into sugar. So please measure them out no more than 1/2-1 cup of rice, oatmeal, pasta, and beans  Veggies are however free foods! Pile them on.   Not all fruit is created equal. Please see the list below, the fruit at the bottom is higher in sugars than the fruit at the top. Please avoid all dried fruits.

## 2015-04-14 NOTE — Progress Notes (Signed)
Assessment and Plan:  1. Hypertension -Continue medication, monitor blood pressure at home. Continue DASH diet.  Reminder to go to the ER if any CP, SOB, nausea, dizziness, severe HA, changes vision/speech, left arm numbness and tingling and jaw pain.  2. Cholesterol -Continue diet and exercise. Check cholesterol.   3. Prediabetes  -Continue diet and exercise. Check A1C  4. Vitamin D Def - check level and continue medications.   Continue diet and meds as discussed. Further disposition pending results of labs. Over 30 minutes of exam, counseling, chart review, and critical decision making was performed  Future Appointments Date Time Provider [place]  09/08/2015 2:00 PM Vicie Mutters, PA-C [id number] None     HPI 39 y.o. female  presents for 3 month follow up on hypertension, cholesterol, prediabetes, and vitamin D deficiency.   Her blood pressure has been controlled at home, today their BP is BP: 118/64 mmHg  She does not workout. She denies chest pain, shortness of breath, dizziness.  She is on cholesterol medication and denies myalgias. Her cholesterol is at goal. The cholesterol last visit was:   Lab Results  Component Value Date   CHOL 146 09/04/2014   HDL 34* 09/04/2014   LDLCALC 85 09/04/2014   TRIG 133 09/04/2014   CHOLHDL 4.3 09/04/2014    She has been working on diet and exercise for prediabetes, and denies paresthesia of the feet, polydipsia, polyuria and visual disturbances. Last A1C in the office was:  Lab Results  Component Value Date   HGBA1C 5.8* 09/04/2014   Patient is on Vitamin D supplement.   Lab Results  Component Value Date   VD25OH 33 09/04/2014     She had a hysterectomy Sept, did have bladder spasms and hematoma afterwards, but is doing better now. Still having lower back pain, she is on percocet PRN.     Current Medications:  Current Outpatient Prescriptions on File Prior to Visit  Medication Sig Dispense Refill  . cholecalciferol  (VITAMIN D) 1000 UNITS tablet Take 2,000 Units by mouth daily.    . Cyanocobalamin (VITAMIN B 12 PO) Take by mouth daily.    . famotidine (PEPCID) 40 MG tablet Take 40 mg by mouth daily.    . fluticasone (FLONASE) 50 MCG/ACT nasal spray Place 2 sprays into both nostrils at bedtime. 16 g 1  . levocetirizine (XYZAL) 5 MG tablet Take 1 tablet by mouth  every evening 90 tablet 99  . loratadine (CLARITIN) 10 MG tablet Take 10 mg by mouth daily.    . Magnesium 250 MG TABS Take 250 mg by mouth daily.    . simvastatin (ZOCOR) 40 MG tablet TAKE ONE TABLET BY MOUTH ONCE DAILY 90 tablet 0   No current facility-administered medications on file prior to visit.   Medical History:  Past Medical History  Diagnosis Date  . Hypertension   . Hyperlipidemia   . GERD (gastroesophageal reflux disease)   . Allergy   . Vitamin D deficiency   . Endometriosis   . Migraine   . Prediabetes    Allergies:  Allergies  Allergen Reactions  . Penicillins     rash     Review of Systems:  Review of Systems  Constitutional: Negative.   HENT: Positive for congestion (on claritin). Negative for ear discharge, ear pain, hearing loss, nosebleeds, sore throat and tinnitus.        + TMJ, wears night guard  Respiratory: Negative.  Negative for stridor.   Cardiovascular: Negative.   Gastrointestinal:  Negative.   Genitourinary: Negative.   Musculoskeletal: Positive for myalgias (calves, at night) and back pain. Negative for joint pain, falls and neck pain.  Skin: Negative.   Neurological: Negative.  Negative for headaches.  Psychiatric/Behavioral: Negative.     Family history- Review and unchanged Social history- Review and unchanged Physical Exam: BP 118/64 mmHg  Pulse 83  Temp(Src) 97.7 F (36.5 C) (Temporal)  Resp 14  Ht 5' 4"  (1.626 m)  Wt 151 lb (68.493 kg)  BMI 25.91 kg/m2  SpO2 96%  LMP 03/15/2015 Wt Readings from Last 3 Encounters:  04/14/15 151 lb (68.493 kg)  09/04/14 154 lb (69.854 kg)   05/08/14 158 lb (71.668 kg)   General Appearance: Well nourished, in no apparent distress. Eyes: PERRLA, EOMs, conjunctiva no swelling or erythema Sinuses: No Frontal/maxillary tenderness ENT/Mouth: Ext aud canals clear, TMs without erythema, bulging. No erythema, swelling, or exudate on post pharynx.  Tonsils not swollen or erythematous. Hearing normal.  Neck: Supple, thyroid normal.  Respiratory: Respiratory effort normal, BS equal bilaterally without rales, rhonchi, wheezing or stridor.  Cardio: RRR with no MRGs. Brisk peripheral pulses without edema.  Abdomen: Soft, + BS, healing bruise around umbilicus,  general tenderness, no guarding, rebound, hernias, masses. Lymphatics: Non tender without lymphadenopathy.  Musculoskeletal: Full ROM, 5/5 strength, Normal gait Skin: Warm, dry without rashes, lesions, ecchymosis.  Neuro: Cranial nerves intact. Normal muscle tone, no cerebellar symptoms. Psych: Awake and oriented X 3, normal affect, Insight and Judgment appropriate.    Vicie Mutters, PA-C 9:56 AM [facility]

## 2015-04-15 LAB — INSULIN, FASTING: INSULIN FASTING, SERUM: 5 u[IU]/mL (ref 2.0–19.6)

## 2015-04-15 LAB — HEPATIC FUNCTION PANEL
ALT: 12 U/L (ref 6–29)
AST: 12 U/L (ref 10–30)
Albumin: 3.8 g/dL (ref 3.6–5.1)
Alkaline Phosphatase: 79 U/L (ref 33–115)
BILIRUBIN DIRECT: 0.1 mg/dL (ref <=0.2)
BILIRUBIN INDIRECT: 0.2 mg/dL (ref 0.2–1.2)
Total Bilirubin: 0.3 mg/dL (ref 0.2–1.2)
Total Protein: 6.6 g/dL (ref 6.1–8.1)

## 2015-04-15 LAB — LIPID PANEL
CHOL/HDL RATIO: 5.8 ratio — AB (ref <=5.0)
Cholesterol: 121 mg/dL — ABNORMAL LOW (ref 125–200)
HDL: 21 mg/dL — ABNORMAL LOW (ref >=46)
LDL Cholesterol: 74 mg/dL (ref <130)
Triglycerides: 132 mg/dL (ref <150)
VLDL: 26 mg/dL (ref <30)

## 2015-04-15 LAB — BASIC METABOLIC PANEL WITH GFR
BUN: 8 mg/dL (ref 7–25)
CHLORIDE: 105 mmol/L (ref 98–110)
CO2: 30 mmol/L (ref 20–31)
CREATININE: 0.48 mg/dL — AB (ref 0.50–1.10)
Calcium: 9.5 mg/dL (ref 8.6–10.2)
GFR, Est African American: >89 mL/min (ref >=60)
Glucose, Bld: 83 mg/dL (ref 65–99)
Potassium: 4.4 mmol/L (ref 3.5–5.3)
SODIUM: 144 mmol/L (ref 135–146)

## 2015-04-15 LAB — MAGNESIUM: Magnesium: 2.2 mg/dL (ref 1.5–2.5)

## 2015-04-15 LAB — TSH: TSH: 0.276 u[IU]/mL — ABNORMAL LOW (ref 0.350–4.500)

## 2015-04-15 LAB — VITAMIN D 25 HYDROXY (VIT D DEFICIENCY, FRACTURES): VIT D 25 HYDROXY: 78 ng/mL (ref 30–100)

## 2015-06-18 ENCOUNTER — Other Ambulatory Visit: Payer: Self-pay | Admitting: Internal Medicine

## 2015-08-05 ENCOUNTER — Encounter: Payer: Self-pay | Admitting: Physician Assistant

## 2015-09-08 ENCOUNTER — Other Ambulatory Visit: Payer: Self-pay

## 2015-09-08 ENCOUNTER — Encounter: Payer: Self-pay | Admitting: Physician Assistant

## 2015-09-08 ENCOUNTER — Ambulatory Visit (INDEPENDENT_AMBULATORY_CARE_PROVIDER_SITE_OTHER): Payer: BLUE CROSS/BLUE SHIELD | Admitting: Physician Assistant

## 2015-09-08 VITALS — BP 140/80 | HR 78 | Temp 98.2°F | Resp 16 | Ht 66.0 in | Wt 153.8 lb

## 2015-09-08 DIAGNOSIS — E785 Hyperlipidemia, unspecified: Secondary | ICD-10-CM | POA: Diagnosis not present

## 2015-09-08 DIAGNOSIS — E559 Vitamin D deficiency, unspecified: Secondary | ICD-10-CM | POA: Diagnosis not present

## 2015-09-08 DIAGNOSIS — R6889 Other general symptoms and signs: Secondary | ICD-10-CM

## 2015-09-08 DIAGNOSIS — R7303 Prediabetes: Secondary | ICD-10-CM | POA: Diagnosis not present

## 2015-09-08 DIAGNOSIS — Z91048 Other nonmedicinal substance allergy status: Secondary | ICD-10-CM

## 2015-09-08 DIAGNOSIS — I10 Essential (primary) hypertension: Secondary | ICD-10-CM | POA: Diagnosis not present

## 2015-09-08 DIAGNOSIS — K2100 Gastro-esophageal reflux disease with esophagitis, without bleeding: Secondary | ICD-10-CM

## 2015-09-08 DIAGNOSIS — D649 Anemia, unspecified: Secondary | ICD-10-CM

## 2015-09-08 DIAGNOSIS — F17200 Nicotine dependence, unspecified, uncomplicated: Secondary | ICD-10-CM | POA: Diagnosis not present

## 2015-09-08 DIAGNOSIS — Z79899 Other long term (current) drug therapy: Secondary | ICD-10-CM | POA: Diagnosis not present

## 2015-09-08 DIAGNOSIS — Z0001 Encounter for general adult medical examination with abnormal findings: Secondary | ICD-10-CM

## 2015-09-08 DIAGNOSIS — Z9109 Other allergy status, other than to drugs and biological substances: Secondary | ICD-10-CM

## 2015-09-08 DIAGNOSIS — G43809 Other migraine, not intractable, without status migrainosus: Secondary | ICD-10-CM | POA: Diagnosis not present

## 2015-09-08 DIAGNOSIS — N809 Endometriosis, unspecified: Secondary | ICD-10-CM

## 2015-09-08 DIAGNOSIS — K824 Cholesterolosis of gallbladder: Secondary | ICD-10-CM | POA: Diagnosis not present

## 2015-09-08 DIAGNOSIS — M5441 Lumbago with sciatica, right side: Secondary | ICD-10-CM

## 2015-09-08 DIAGNOSIS — Z1159 Encounter for screening for other viral diseases: Secondary | ICD-10-CM | POA: Diagnosis not present

## 2015-09-08 DIAGNOSIS — R41840 Attention and concentration deficit: Secondary | ICD-10-CM

## 2015-09-08 MED ORDER — METOPROLOL TARTRATE 25 MG PO TABS: 25.0000 mg | ORAL_TABLET | Freq: Every day | ORAL | Status: DC

## 2015-09-08 MED ORDER — LEVOCETIRIZINE DIHYDROCHLORIDE 5 MG PO TABS: ORAL_TABLET | ORAL | Status: DC

## 2015-09-08 MED ORDER — SIMVASTATIN 40 MG PO TABS: 40.0000 mg | ORAL_TABLET | Freq: Every day | ORAL | Status: DC

## 2015-09-08 MED ORDER — BUPROPION HCL ER (XL) 150 MG PO TB24: 150.0000 mg | ORAL_TABLET | ORAL | Status: DC

## 2015-09-08 NOTE — Progress Notes (Signed)
Complete Physical  Assessment and Plan: 1. Essential hypertension - continue medications, DASH diet, exercise and monitor at home. Call if greater than 130/80.  - CBC with Differential/Platelet - BASIC METABOLIC PANEL WITH GFR - Hepatic function panel - TSH - Urinalysis, Routine w reflex microscopic (not at [facility]) - Microalbumin / creatinine urine ratio  2. Hyperlipidemia -continue medications, check lipids, decrease fatty foods, increase activity.  - Lipid panel - simvastatin (ZOCOR) 40 MG tablet; Take 1 tablet (40 mg total) by mouth daily.  Dispense: 90 tablet; Refill: 1  3. Prediabetes Discussed general issues about diabetes pathophysiology and management., Educational material distributed., Suggested low cholesterol diet., Encouraged aerobic exercise., Discussed foot care., Reminded to get yearly retinal exam. - Hemoglobin A1c - Insulin, fasting  4. Vitamin D deficiency - VITAMIN D 25 Hydroxy (Vit-D Deficiency, Fractures)  5. Endometriosis S/p hysterectomy has helped  6. Gastroesophageal reflux disease with esophagitis Diet controlled  7. Gallbladder polyp  8. Other migraine without status migrainosus, not intractable Better s/p hysterectomy  9. Right-sided low back pain with right-sided sciatica Will call if wants PT  10. Medication management - Magnesium  11. Encounter for general adult medical examination with abnormal findings - CBC with Differential/Platelet - BASIC METABOLIC PANEL WITH GFR - Hepatic function panel - TSH - Lipid panel - Hemoglobin A1c - Magnesium - VITAMIN D 25 Hydroxy (Vit-D Deficiency, Fractures) - Insulin, fasting - Urinalysis, Routine w reflex microscopic (not at [facility]) - Microalbumin / creatinine urine ratio - Iron and TIBC - Ferritin - Vitamin B12 - HIV antibody - levocetirizine (XYZAL) 5 MG tablet; Take 1 tablet by mouth  every evening  Dispense: 90 tablet; Refill: 99 - metoprolol tartrate (LOPRESSOR) 25 MG tablet; Take 1  tablet (25 mg total) by mouth daily.  Dispense: 90 tablet; Refill: 3 - simvastatin (ZOCOR) 40 MG tablet; Take 1 tablet (40 mg total) by mouth daily.  Dispense: 90 tablet; Refill: 1  12. Tobacco use disorder - buPROPion (WELLBUTRIN XL) 150 MG 24 hr tablet; Take 1 tablet (150 mg total) by mouth every morning.  Dispense: 30 tablet; Refill: 2  13. Poor concentration Check labs - TSH - buPROPion (WELLBUTRIN XL) 150 MG 24 hr tablet; Take 1 tablet (150 mg total) by mouth every morning.  Dispense: 30 tablet; Refill: 2  14. Environmental allergies - levocetirizine (XYZAL) 5 MG tablet; Take 1 tablet by mouth  every evening  Dispense: 90 tablet; Refill: 99  15. Anemia, unspecified anemia type - Iron and TIBC - Ferritin - Vitamin B12 - metoprolol tartrate (LOPRESSOR) 25 MG tablet; Take 1 tablet (25 mg total) by mouth daily.  Dispense: 90 tablet; Refill: 3  16. Screening for viral disease - HIV antibody    Discussed med's effects and SE's. Screening labs and tests as requested with regular follow-up as recommended.  HPI Patient presents for complete physical.   Patient's blood pressure has been controlled at home, running 130-140 at home. Patient denies chest pain, shortness of breath, dizziness. BP: 140/80 mmHg  Patient's cholesterol is diet controlled. She is on zocor and denies myalgias. The patient's cholesterol last visit was Lab Results  Component Value Date   CHOL 121* 04/14/2015   HDL 21* 04/14/2015   LDLCALC 74 04/14/2015   TRIG 132 04/14/2015   CHOLHDL 5.8* 04/14/2015   The patient has been working on diet and exercise for prediabetes, denies changes in vision, polys, and paresthesias. Last A1C in office  Lab Results  Component Value Date   HGBA1C 5.6  04/14/2015   Thrombocytopenia history Lab Results  Component Value Date   WBC 11.4* 04/14/2015   HGB 12.4 04/14/2015   HCT 36.2 04/14/2015   MCV 92.6 04/14/2015   PLT 294 04/14/2015   She had EGD and was treated for H  pylori by Dr. Hilarie Fredrickson in 2015.  She has continuing lower back pain, now complaining of right leg pain posterior radiation to her heel. Worse if she sits for a long time and then gets up.  States she had fractured pelvis back in 1992. Normal lumbar Xray 2013.  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   Current Medications:  Current Outpatient Prescriptions on File Prior to Visit  Medication Sig Dispense Refill  . cholecalciferol (VITAMIN D) 1000 UNITS tablet Take 2,000 Units by mouth daily.    . Cyanocobalamin (VITAMIN B 12 PO) Take by mouth daily.    . famotidine (PEPCID) 40 MG tablet Take 40 mg by mouth daily.    . fluticasone (FLONASE) 50 MCG/ACT nasal spray Place 2 sprays into both nostrils at bedtime. 16 g 1  . levocetirizine (XYZAL) 5 MG tablet Take 1 tablet by mouth  every evening 90 tablet 99  . loratadine (CLARITIN) 10 MG tablet Take 10 mg by mouth daily.    . Magnesium 250 MG TABS Take 250 mg by mouth daily.    . metoprolol tartrate (LOPRESSOR) 25 MG tablet Take 1 tablet (25 mg total) by mouth daily. 90 tablet 3  . simvastatin (ZOCOR) 40 MG tablet TAKE ONE TABLET BY MOUTH ONCE DAILY 90 tablet 0   No current facility-administered medications on file prior to visit.   Health Maintenance:  Tetanus: 07/2012 Pneumovax: N/A Flu vaccine: declines  Zostavax: N/A LMP: NOW Pap: 2016 Dr. Corinna Capra s/p hysterectomy in 2016 3D MGM:  05/2015 category C DEXA: N/A  Colonoscopy:N/A  EGD: 09/2013  Patient Care Team: Unk Pinto, MD as PCP - General (Internal Medicine) Jerene Bears, MD as Consulting Physician (Gastroenterology) Louretta Shorten, MD as Consulting Physician (Obstetrics and Gynecology)  Allergies:  Allergies  Allergen Reactions  . Penicillins     rash   Medical History:  Past Medical History  Diagnosis Date  .  Hypertension   . Hyperlipidemia   . GERD (gastroesophageal reflux disease)   . Allergy   . Vitamin D deficiency   . Endometriosis   . Migraine   . Prediabetes    Surgical History:  Past Surgical History  Procedure Laterality Date  . Refractive surgery  2004  . Abdominal hysterectomy  03/2015    Dr. Corinna Capra   Family History:  Family History  Problem Relation Age of Onset  . Hyperlipidemia Mother   . Irritable bowel syndrome Mother   . Heart disease Father   . Hyperlipidemia Father   . Hypertension Father   . Breast cancer Maternal Aunt     [employer]  . Prostate cancer Maternal Grandfather   . Colon cancer Neg Hx   . Esophageal cancer Neg Hx   . Stomach cancer Neg Hx   . Rectal cancer Neg Hx    Social History:  Social History   Social History  . Marital Status: Married    Spouse Name: N/A  . Number of Children: 0  . Years of Education: N/A   Occupational History  . Manager in billing dept [employer]   Social History Main Topics  . Smoking status: Current Every Day Smoker -- 0.50 packs/day for 20 years  . Smokeless tobacco: Never Used  . Alcohol Use: No  . Drug Use: No  . Sexual Activity: Not on file     Comment: on period   Other Topics Concern  . Not on file   Social History Narrative   ROS Review of Systems  Constitutional: Negative.   HENT: Positive for congestion. Negative for ear discharge, ear pain, hearing loss, nosebleeds, sore throat and tinnitus.        + TMJ, wears night guard  Respiratory: Negative.  Negative for stridor.   Cardiovascular: Negative.   Gastrointestinal: Negative.   Genitourinary: Negative.   Musculoskeletal: Positive for back pain. Negative for myalgias, joint pain, falls and neck pain.  Skin: Negative.   Neurological: Negative.  Negative for headaches.  Psychiatric/Behavioral: Negative.      Physical Exam: Estimated body mass index is 24.84 kg/(m^2) as calculated from the following:   Height as of this encounter: 5' 6"   (1.676 m).   Weight as of this encounter: 153 lb 12.8 oz (69.763 kg). Filed Vitals:   09/08/15 1356  BP: 140/80  Pulse: 78  Temp: 98.2 F (36.8 C)  Resp: 16   General Appearance: Well nourished, in no apparent distress. Eyes: PERRLA, EOMs, conjunctiva no swelling or erythema, normal fundi and vessels. Sinuses: No Frontal/maxillary tenderness ENT/Mouth: Ext aud canals clear, normal light reflex with TMs without erythema, bulging.  Good dentition. No erythema, swelling, or exudate on post pharynx. Tonsils not swollen or erythematous. Hearing normal. + TMJ tenderness on left side> right Neck: Supple, thyroid normal. No bruits Respiratory: Respiratory effort normal, BS equal bilaterally without rales, rhonchi, wheezing or stridor. Cardio: RRR without murmurs, rubs or gallops. Brisk peripheral pulses without edema.  Chest: symmetric, with normal excursions and percussion. Breasts: defer OB/GYN Abdomen: Soft, +BS. RUQ tenderness, no guarding, rebound, hernias, masses, or organomegaly. .  Lymphatics: Non tender without lymphadenopathy.  Genitourinary: defer Musculoskeletal: Full ROM all peripheral extremities,5/5 strength, and normal gait. + right SI tenderness, negative straight leg raise Skin: Warm, dry without rashes, lesions, ecchymosis.  Neuro: Cranial nerves intact, reflexes equal bilaterally. Normal muscle tone, no cerebellar symptoms. Sensation intact.  Psych: Awake and oriented X 3, normal affect, Insight and Judgment appropriate.   EKG: defer had prior to surgery    Vicie Mutters 2:05 PM

## 2015-09-08 NOTE — Patient Instructions (Addendum)
Preventive Care for Adults  A healthy lifestyle and preventive care can promote health and wellness. Preventive health guidelines for women include the following key practices.  A routine yearly physical is a good way to check with your health care provider about your health and preventive screening. It is a chance to share any concerns and updates on your health and to receive a thorough exam.  Visit your dentist for a routine exam and preventive care every 6 months. Brush your teeth twice a day and floss once a day. Good oral hygiene prevents tooth decay and gum disease.  The frequency of eye exams is based on your age, health, family medical history, use of contact lenses, and other factors. Follow your health care provider's recommendations for frequency of eye exams.  Eat a healthy diet. Foods like vegetables, fruits, whole grains, low-fat dairy products, and lean protein foods contain the nutrients you need without too many calories. Decrease your intake of foods high in solid fats, added sugars, and salt. Eat the right amount of calories for you.Get information about a proper diet from your health care provider, if necessary.  Regular physical exercise is one of the most important things you can do for your health. Most adults should get at least 150 minutes of moderate-intensity exercise (any activity that increases your heart rate and causes you to sweat) each week. In addition, most adults need muscle-strengthening exercises on 2 or more days a week.  Maintain a healthy weight. The body mass index (BMI) is a screening tool to identify possible weight problems. It provides an estimate of body fat based on height and weight. Your health care provider can find your BMI and can help you achieve or maintain a healthy weight.For adults 20 years and older:  A BMI below 18.5 is considered underweight.  A BMI of 18.5 to 24.9 is normal.  A BMI of 25 to 29.9 is considered overweight.  A BMI of  30 and above is considered obese.  Maintain normal blood lipids and cholesterol levels by exercising and minimizing your intake of saturated fat. Eat a balanced diet with plenty of fruit and vegetables. Blood tests for lipids and cholesterol should begin at age 20 and be repeated every 5 years. If your lipid or cholesterol levels are high, you are over 50, or you are at high risk for heart disease, you may need your cholesterol levels checked more frequently.Ongoing high lipid and cholesterol levels should be treated with medicines if diet and exercise are not working.  If you smoke, find out from your health care provider how to quit. If you do not use tobacco, do not start.  Lung cancer screening is recommended for adults aged 55-80 years who are at high risk for developing lung cancer because of a history of smoking. A yearly low-dose CT scan of the lungs is recommended for people who have at least a 30-pack-year history of smoking and are a current smoker or have quit within the past 15 years. A pack year of smoking is smoking an average of 1 pack of cigarettes a day for 1 year (for example: 1 pack a day for 30 years or 2 packs a day for 15 years). Yearly screening should continue until the smoker has stopped smoking for at least 15 years. Yearly screening should be stopped for people who develop a health problem that would prevent them from having lung cancer treatment.  High blood pressure causes heart disease and increases the risk of   stroke. Your blood pressure should be checked at least every 1 to 2 years. Ongoing high blood pressure should be treated with medicines if weight loss and exercise do not work.  If you are 55-79 years old, ask your health care provider if you should take aspirin to prevent strokes.  Diabetes screening involves taking a blood sample to check your fasting blood sugar level. This should be done once every 3 years, after age 45, if you are within normal weight and  without risk factors for diabetes. Testing should be considered at a younger age or be carried out more frequently if you are overweight and have at least 1 risk factor for diabetes.  Breast cancer screening is essential preventive care for women. You should practice "breast self-awareness." This means understanding the normal appearance and feel of your breasts and may include breast self-examination. Any changes detected, no matter how small, should be reported to a health care provider. Women in their 20s and 30s should have a clinical breast exam (CBE) by a health care provider as part of a regular health exam every 1 to 3 years. After age 40, women should have a CBE every year. Starting at age 40, women should consider having a mammogram (breast X-ray test) every year. Women who have a family history of breast cancer should talk to their health care provider about genetic screening. Women at a high risk of breast cancer should talk to their health care providers about having an MRI and a mammogram every year.  Breast cancer gene (BRCA)-related cancer risk assessment is recommended for women who have family members with BRCA-related cancers. BRCA-related cancers include breast, ovarian, tubal, and peritoneal cancers. Having family members with these cancers may be associated with an increased risk for harmful changes (mutations) in the breast cancer genes BRCA1 and BRCA2. Results of the assessment will determine the need for genetic counseling and BRCA1 and BRCA2 testing.  Routine pelvic exams to screen for cancer are no longer recommended for nonpregnant women who are considered low risk for cancer of the pelvic organs (ovaries, uterus, and vagina) and who do not have symptoms. Ask your health care provider if a screening pelvic exam is right for you.  If you have had past treatment for cervical cancer or a condition that could lead to cancer, you need Pap tests and screening for cancer for at least 20  years after your treatment. If Pap tests have been discontinued, your risk factors (such as having a new sexual partner) need to be reassessed to determine if screening should be resumed. Some women have medical problems that increase the chance of getting cervical cancer. In these cases, your health care provider may recommend more frequent screening and Pap tests.  Colorectal cancer can be detected and often prevented. Most routine colorectal cancer screening begins at the age of 50 years and continues through age 75 years. However, your health care provider may recommend screening at an earlier age if you have risk factors for colon cancer. On a yearly basis, your health care provider may provide home test kits to check for hidden blood in the stool. Use of a small camera at the end of a tube, to directly examine the colon (sigmoidoscopy or colonoscopy), can detect the earliest forms of colorectal cancer. Talk to your health care provider about this at age 50, when routine screening begins. Direct exam of the colon should be repeated every 5-10 years through age 75 years, unless early forms of pre-cancerous   polyps or small growths are found.  Hepatitis C blood testing is recommended for all people born from 1945 through 1965 and any individual with known risks for hepatitis C.  Pra  Osteoporosis is a disease in which the bones lose minerals and strength with aging. This can result in serious bone fractures or breaks. The risk of osteoporosis can be identified using a bone density scan. Women ages 65 years and over and women at risk for fractures or osteoporosis should discuss screening with their health care providers. Ask your health care provider whether you should take a calcium supplement or vitamin D to reduce the rate of osteoporosis.  Menopause can be associated with physical symptoms and risks. Hormone replacement therapy is available to decrease symptoms and risks. You should talk to your  health care provider about whether hormone replacement therapy is right for you.  Use sunscreen. Apply sunscreen liberally and repeatedly throughout the day. You should seek shade when your shadow is shorter than you. Protect yourself by wearing long sleeves, pants, a wide-brimmed hat, and sunglasses year round, whenever you are outdoors.  Once a month, do a whole body skin exam, using a mirror to look at the skin on your back. Tell your health care provider of new moles, moles that have irregular borders, moles that are larger than a pencil eraser, or moles that have changed in shape or color.  Stay current with required vaccines (immunizations).  Influenza vaccine. All adults should be immunized every year.  Tetanus, diphtheria, and acellular pertussis (Td, Tdap) vaccine. Pregnant women should receive 1 dose of Tdap vaccine during each pregnancy. The dose should be obtained regardless of the length of time since the last dose. Immunization is preferred during the 27th-36th week of gestation. An adult who has not previously received Tdap or who does not know her vaccine status should receive 1 dose of Tdap. This initial dose should be followed by tetanus and diphtheria toxoids (Td) booster doses every 10 years. Adults with an unknown or incomplete history of completing a 3-dose immunization series with Td-containing vaccines should begin or complete a primary immunization series including a Tdap dose. Adults should receive a Td booster every 10 years.  Varicella vaccine. An adult without evidence of immunity to varicella should receive 2 doses or a second dose if she has previously received 1 dose. Pregnant females who do not have evidence of immunity should receive the first dose after pregnancy. This first dose should be obtained before leaving the health care facility. The second dose should be obtained 4-8 weeks after the first dose.  Human papillomavirus (HPV) vaccine. Females aged 13-26 years  who have not received the vaccine previously should obtain the 3-dose series. The vaccine is not recommended for use in pregnant females. However, pregnancy testing is not needed before receiving a dose. If a female is found to be pregnant after receiving a dose, no treatment is needed. In that case, the remaining doses should be delayed until after the pregnancy. Immunization is recommended for any person with an immunocompromised condition through the age of 26 years if she did not get any or all doses earlier. During the 3-dose series, the second dose should be obtained 4-8 weeks after the first dose. The third dose should be obtained 24 weeks after the first dose and 16 weeks after the second dose.  Zoster vaccine. One dose is recommended for adults aged 60 years or older unless certain conditions are present.  Measles, mumps, and rubella (  MMR) vaccine. Adults born before [birth year] generally are considered immune to measles and mumps. Adults born in [birth year] or later should have 1 or more doses of MMR vaccine unless there is a contraindication to the vaccine or there is laboratory evidence of immunity to each of the three diseases. A routine second dose of MMR vaccine should be obtained at least 28 days after the first dose for students attending postsecondary schools, health care workers, or international travelers. People who received inactivated measles vaccine or an unknown type of measles vaccine during 1963-1967 should receive 2 doses of MMR vaccine. People who received inactivated mumps vaccine or an unknown type of mumps vaccine before 1979 and are at high risk for mumps infection should consider immunization with 2 doses of MMR vaccine. For females of childbearing age, rubella immunity should be determined. If there is no evidence of immunity, females who are not pregnant should be vaccinated. If there is no evidence of immunity, females who are pregnant should delay immunization until after pregnancy.  Unvaccinated health care workers born before [birth year] who lack laboratory evidence of measles, mumps, or rubella immunity or laboratory confirmation of disease should consider measles and mumps immunization with 2 doses of MMR vaccine or rubella immunization with 1 dose of MMR vaccine.  Pneumococcal 13-valent conjugate (PCV13) vaccine. When indicated, a person who is uncertain of her immunization history and has no record of immunization should receive the PCV13 vaccine. An adult aged 39 years or older who has certain medical conditions and has not been previously immunized should receive 1 dose of PCV13 vaccine. This PCV13 should be followed with a dose of pneumococcal polysaccharide (PPSV23) vaccine. The PPSV23 vaccine dose should be obtained at least 8 weeks after the dose of PCV13 vaccine. An adult aged 62 years or older who has certain medical conditions and previously received 1 or more doses of PPSV23 vaccine should receive 1 dose of PCV13. The PCV13 vaccine dose should be obtained 1 or more years after the last PPSV23 vaccine dose.    Pneumococcal polysaccharide (PPSV23) vaccine. When PCV13 is also indicated, PCV13 should be obtained first. All adults aged 67 years and older should be immunized. An adult younger than age 45 years who has certain medical conditions should be immunized. Any person who resides in a nursing home or long-term care facility should be immunized. An adult smoker should be immunized. People with an immunocompromised condition and certain other conditions should receive both PCV13 and PPSV23 vaccines. People with human immunodeficiency virus (HIV) infection should be immunized as soon as possible after diagnosis. Immunization during chemotherapy or radiation therapy should be avoided. Routine use of PPSV23 vaccine is not recommended for American Indians, [place] Natives, or people younger than 65 years unless there are medical conditions that require PPSV23 vaccine. When indicated,  people who have unknown immunization and have no record of immunization should receive PPSV23 vaccine. One-time revaccination 5 years after the first dose of PPSV23 is recommended for people aged 19-64 years who have chronic kidney failure, nephrotic syndrome, asplenia, or immunocompromised conditions. People who received 1-2 doses of PPSV23 before age 23 years should receive another dose of PPSV23 vaccine at age 35 years or later if at least 5 years have passed since the previous dose. Doses of PPSV23 are not needed for people immunized with PPSV23 at or after age 38 years.  Preventive Services / Frequency   Ages 43 to 86 years  Blood pressure check.  Lipid and cholesterol check.  Lung  cancer screening. / Every year if you are aged 57-80 years and have a 30-pack-year history of smoking and currently smoke or have quit within the past 15 years. Yearly screening is stopped once you have quit smoking for at least 15 years or develop a health problem that would prevent you from having lung cancer treatment.  Clinical breast exam.** / Every year after age 64 years.  BRCA-related cancer risk assessment.** / For women who have family members with a BRCA-related cancer (breast, ovarian, tubal, or peritoneal cancers).  Mammogram.** / Every year beginning at age 43 years and continuing for as long as you are in good health. Consult with your health care provider.  Pap test.** / Every 3 years starting at age 46 years through age 101 or 22 years with a history of 3 consecutive normal Pap tests.  HPV screening.** / Every 3 years from ages 54 years through ages 31 to 78 years with a history of 3 consecutive normal Pap tests.  Fecal occult blood test (FOBT) of stool. / Every year beginning at age 80 years and continuing until age 64 years. You may not need to do this test if you get a colonoscopy every 10 years.  Flexible sigmoidoscopy or colonoscopy.** / Every 5 years for a flexible sigmoidoscopy or  every 10 years for a colonoscopy beginning at age 91 years and continuing until age 6 years.  Hepatitis C blood test.** / For all people born from [birth year] through 1965 and any individual with known risks for hepatitis C.  Skin self-exam. / Monthly.  Influenza vaccine. / Every year.  Tetanus, diphtheria, and acellular pertussis (Tdap/Td) vaccine.** / Consult your health care provider. Pregnant women should receive 1 dose of Tdap vaccine during each pregnancy. 1 dose of Td every 10 years.  Varicella vaccine.** / Consult your health care provider. Pregnant females who do not have evidence of immunity should receive the first dose after pregnancy.  Zoster vaccine.** / 1 dose for adults aged 17 years or older.  Pneumococcal 13-valent conjugate (PCV13) vaccine.** / Consult your health care provider.  Pneumococcal polysaccharide (PPSV23) vaccine.** / 1 to 2 doses if you smoke cigarettes or if you have certain conditions.  Meningococcal vaccine.** / Consult your health care provider.  Hepatitis A vaccine.** / Consult your health care provider.  Hepatitis B vaccine.** / Consult your health care provider. Screening for abdominal aortic aneurysm (AAA)  by ultrasound is recommended for people over 50 who have history of high blood pressure or who are current or former smokers.  Varicose Veins Varicose veins are veins that have become enlarged and twisted. CAUSES This condition is the result of valves in the veins not working properly. Valves in the veins help return blood from the leg to the heart. When your calf muscles squeeze, the blood moves up your leg then the valves close and this continues until the blood gets back to your heart.  If these valves are damaged, blood flows backwards and backs up into the veins in the leg near the skin OR if your are sitting/standing for a long time without using your calf muscles the blood will back up into the veins in your legs. This causes the veins to become  larger. People who are on their feet a lot, sit a lot without walking (like on a plane, at a desk, or in a car), who are pregnant, or who are overweight are more likely to develop varicose veins. SYMPTOMS   Bulging, twisted-appearing, bluish veins,  most commonly found on the legs.  Leg pain or a feeling of heaviness. These symptoms may be worse at the end of the day.  Leg swelling.  Skin color changes. DIAGNOSIS  Varicose veins can usually be diagnosed with an exam of your legs by your caregiver. He or she may recommend an ultrasound of your leg veins. TREATMENT  Most varicose veins can be treated at home.However, other treatments are available for people who have persistent symptoms or who want to treat the cosmetic appearance of the varicose veins. But this is only cosmetic and they will return if not properly treated. These include:  Laser treatment of very small varicose veins.  Medicine that is shot (injected) into the vein. This medicine hardens the walls of the vein and closes off the vein. This treatment is called sclerotherapy. Afterwards, you may need to wear clothing or bandages that apply pressure.  Surgery. HOME CARE INSTRUCTIONS   Do not stand or sit in one position for long periods of time. Do not sit with your legs crossed. Rest with your legs raised during the day.  Your legs have to be higher than your heart so that gravity will force the valves to open, so please really elevate your legs.   Wear elastic stockings or support hose. Do not wear other tight, encircling garments around the legs, pelvis, or waist.  [employer]  has a wide variety of well priced compression stockings. [place], [place] [id number] [id number] [place] ARE COPPER INFUSED COMPRESSION SOCKS AT [facility] OR [facility]  Walk as much as possible to increase blood flow.  Raise the foot of your bed at night with 2-inch blocks.  If you get a cut in the skin over the vein and the  vein bleeds, lie down with your leg raised and press on it with a clean cloth until the bleeding stops. Then place a bandage (dressing) on the cut. See your caregiver if it continues to bleed or needs stitches. SEEK MEDICAL CARE IF:   The skin around your ankle starts to break down.  You have pain, redness, tenderness, or hard swelling developing in your leg over a vein.  You are uncomfortable due to leg pain. Document Released: 04/07/2005 Document Revised: 09/20/2011 Document Reviewed: 08/24/2010 [facility] Patient Information 2014 [place].    Monitor your blood pressure at home. Go to the ER if any CP, SOB, nausea, dizziness, severe HA, changes vision/speech  Goal BP:  For patients younger than 60: Goal BP < 140/90. For patients 60 and older: Goal BP < 150/90. For patients with diabetes: Goal BP < 140/90. Your most recent BP: BP: 140/80 mmHg   Take your medications faithfully as instructed. Maintain a healthy weight. Get at least 150 minutes of aerobic exercise per week. Minimize salt intake. Minimize alcohol intake  DASH Eating Plan DASH stands for "Dietary Approaches to Stop Hypertension." The DASH eating plan is a healthy eating plan that has been shown to reduce high blood pressure (hypertension). Additional health benefits may include reducing the risk of type 2 diabetes mellitus, heart disease, and stroke. The DASH eating plan may also help with weight loss. WHAT DO I NEED TO KNOW ABOUT THE DASH EATING PLAN? For the DASH eating plan, you will follow these general guidelines:  Choose foods with a percent daily value for sodium of less than 5% (as listed on the food label).  Use salt-free seasonings or herbs instead of table salt or sea  salt.  Check with your health care provider or pharmacist before using salt substitutes.  Eat lower-sodium products, often labeled as "lower sodium" or "no salt added."  Eat fresh foods.  Eat more vegetables, fruits, and low-fat  dairy products.  Choose whole grains. Look for the word "whole" as the first word in the ingredient list.  Choose fish and skinless chicken or [place] more often than red meat. Limit fish, poultry, and meat to 6 oz (170 g) each day.  Limit sweets, desserts, sugars, and sugary drinks.  Choose heart-healthy fats.  Limit cheese to 1 oz (28 g) per day.  Eat more home-cooked food and less restaurant, buffet, and fast food.  Limit fried foods.  Cook foods using methods other than frying.  Limit canned vegetables. If you do use them, rinse them well to decrease the sodium.  When eating at a restaurant, ask that your food be prepared with less salt, or no salt if possible. WHAT FOODS CAN I EAT? Seek help from a dietitian for individual calorie needs. Grains Whole grain or whole wheat bread. Brown rice. Whole grain or whole wheat pasta. Quinoa, bulgur, and whole grain cereals. Low-sodium cereals. Corn or whole wheat flour tortillas. Whole grain cornbread. Whole grain crackers. Low-sodium crackers. Vegetables Fresh or frozen vegetables (raw, steamed, roasted, or grilled). Low-sodium or reduced-sodium tomato and vegetable juices. Low-sodium or reduced-sodium tomato sauce and paste. Low-sodium or reduced-sodium canned vegetables.  Fruits All fresh, canned (in natural juice), or frozen fruits. Meat and Other Protein Products Ground beef (85% or leaner), grass-fed beef, or beef trimmed of fat. Skinless chicken or [place]. Ground chicken or [place]. Pork trimmed of fat. All fish and seafood. Eggs. Dried beans, peas, or lentils. Unsalted nuts and seeds. Unsalted canned beans. Dairy Low-fat dairy products, such as skim or 1% milk, 2% or reduced-fat cheeses, low-fat ricotta or cottage cheese, or plain low-fat yogurt. Low-sodium or reduced-sodium cheeses. Fats and Oils Tub margarines without trans fats. Light or reduced-fat mayonnaise and salad dressings (reduced sodium). Avocado. Safflower, olive, or  canola oils. Natural peanut or almond butter. Other Unsalted popcorn and pretzels. The items listed above may not be a complete list of recommended foods or beverages. Contact your dietitian for more options. WHAT FOODS ARE NOT RECOMMENDED? Grains White bread. White pasta. White rice. Refined cornbread. Bagels and croissants. Crackers that contain trans fat. Vegetables Creamed or fried vegetables. Vegetables in a cheese sauce. Regular canned vegetables. Regular canned tomato sauce and paste. Regular tomato and vegetable juices. Fruits Dried fruits. Canned fruit in light or heavy syrup. Fruit juice. Meat and Other Protein Products Fatty cuts of meat. Ribs, chicken wings, bacon, sausage, bologna, salami, chitterlings, fatback, hot dogs, bratwurst, and packaged luncheon meats. Salted nuts and seeds. Canned beans with salt. Dairy Whole or 2% milk, cream, half-and-half, and cream cheese. Whole-fat or sweetened yogurt. Full-fat cheeses or blue cheese. Nondairy creamers and whipped toppings. Processed cheese, cheese spreads, or cheese curds. Condiments Onion and garlic salt, seasoned salt, table salt, and sea salt. Canned and packaged gravies. Worcestershire sauce. Tartar sauce. Barbecue sauce. Teriyaki sauce. Soy sauce, including reduced sodium. Steak sauce. Fish sauce. Oyster sauce. Cocktail sauce. Horseradish. Ketchup and mustard. Meat flavorings and tenderizers. Bouillon cubes. Hot sauce. Tabasco sauce. Marinades. Taco seasonings. Relishes. Fats and Oils Butter, stick margarine, lard, shortening, ghee, and bacon fat. Coconut, palm kernel, or palm oils. Regular salad dressings. Other Pickles and olives. Salted popcorn and pretzels. The items listed above may not be a complete list  of foods and beverages to avoid. Contact your dietitian for more information. WHERE CAN I FIND MORE INFORMATION? National Heart, Lung, and Blood Institute: travelstabloid.com Document  Released: 06/17/2011 Document Revised: 11/12/2013 Document Reviewed: 05/02/2013 [facility] Patient Information 2015 [place], [place]. This information is not intended to replace advice given to you by your health care provider. Make sure you discuss any questions you have with your health care provider.

## 2015-09-09 LAB — MAGNESIUM: Magnesium: 2.1 mg/dL (ref 1.5–2.5)

## 2015-09-09 LAB — CBC WITH DIFFERENTIAL/PLATELET
BASOS ABS: 0 10*3/uL (ref 0.0–0.1)
BASOS PCT: 0 % (ref 0–1)
EOS ABS: 0.1 10*3/uL (ref 0.0–0.7)
Eosinophils Relative: 1 % (ref 0–5)
HCT: 39.3 % (ref 36.0–46.0)
HEMOGLOBIN: 13 g/dL (ref 12.0–15.0)
LYMPHS ABS: 2.4 10*3/uL (ref 0.7–4.0)
Lymphocytes Relative: 31 % (ref 12–46)
MCH: 30.7 pg (ref 26.0–34.0)
MCHC: 33.1 g/dL (ref 30.0–36.0)
MCV: 92.7 fL (ref 78.0–100.0)
MONOS PCT: 7 % (ref 3–12)
MPV: 13.4 fL — AB (ref 8.6–12.4)
Monocytes Absolute: 0.6 10*3/uL (ref 0.1–1.0)
NEUTROS ABS: 4.8 10*3/uL (ref 1.7–7.7)
NEUTROS PCT: 61 % (ref 43–77)
PLATELETS: 149 10*3/uL — AB (ref 150–400)
RBC: 4.24 MIL/uL (ref 3.87–5.11)
RDW: 13.8 % (ref 11.5–15.5)
WBC: 7.9 10*3/uL (ref 4.0–10.5)

## 2015-09-09 LAB — MICROALBUMIN / CREATININE URINE RATIO
CREATININE, URINE: 69 mg/dL (ref 20–320)
Microalb Creat Ratio: 6 mcg/mg creat (ref <30)
Microalb, Ur: 0.4 mg/dL

## 2015-09-09 LAB — BASIC METABOLIC PANEL WITH GFR
BUN: 9 mg/dL (ref 7–25)
CHLORIDE: 106 mmol/L (ref 98–110)
CO2: 29 mmol/L (ref 20–31)
Calcium: 9.2 mg/dL (ref 8.6–10.2)
Creat: 0.66 mg/dL (ref 0.50–1.10)
GFR, Est African American: >89 mL/min (ref >=60)
GFR, Est Non African American: >89 mL/min (ref >=60)
Glucose, Bld: 75 mg/dL (ref 65–99)
POTASSIUM: 4.1 mmol/L (ref 3.5–5.3)
SODIUM: 142 mmol/L (ref 135–146)

## 2015-09-09 LAB — URINALYSIS, ROUTINE W REFLEX MICROSCOPIC
Bilirubin Urine: NEGATIVE
GLUCOSE, UA: NEGATIVE
HGB URINE DIPSTICK: NEGATIVE
Ketones, ur: NEGATIVE
LEUKOCYTES UA: NEGATIVE
Nitrite: NEGATIVE
PROTEIN: NEGATIVE
Specific Gravity, Urine: 1.012 (ref 1.001–1.035)
pH: 6.5 (ref 5.0–8.0)

## 2015-09-09 LAB — VITAMIN B12: Vitamin B-12: 686 pg/mL (ref 200–1100)

## 2015-09-09 LAB — LIPID PANEL
CHOLESTEROL: 135 mg/dL (ref 125–200)
HDL: 32 mg/dL — AB (ref >=46)
LDL Cholesterol: 83 mg/dL (ref <130)
TRIGLYCERIDES: 102 mg/dL (ref <150)
Total CHOL/HDL Ratio: 4.2 Ratio (ref <=5.0)
VLDL: 20 mg/dL (ref <30)

## 2015-09-09 LAB — HEMOGLOBIN A1C
HEMOGLOBIN A1C: 5.8 % — AB (ref <5.7)
Mean Plasma Glucose: 120 mg/dL — ABNORMAL HIGH (ref <117)

## 2015-09-09 LAB — HIV ANTIBODY (ROUTINE TESTING W REFLEX): HIV: NONREACTIVE

## 2015-09-09 LAB — IRON AND TIBC
%SAT: 48 % (ref 11–50)
Iron: 134 ug/dL (ref 40–190)
TIBC: 277 ug/dL (ref 250–450)
UIBC: 143 ug/dL (ref 125–400)

## 2015-09-09 LAB — HEPATIC FUNCTION PANEL
ALK PHOS: 60 U/L (ref 33–115)
ALT: 10 U/L (ref 6–29)
AST: 13 U/L (ref 10–30)
Albumin: 4.4 g/dL (ref 3.6–5.1)
BILIRUBIN DIRECT: 0.1 mg/dL (ref <=0.2)
BILIRUBIN INDIRECT: 0.4 mg/dL (ref 0.2–1.2)
BILIRUBIN TOTAL: 0.5 mg/dL (ref 0.2–1.2)
Total Protein: 6.6 g/dL (ref 6.1–8.1)

## 2015-09-09 LAB — VITAMIN D 25 HYDROXY (VIT D DEFICIENCY, FRACTURES): VIT D 25 HYDROXY: 68 ng/mL (ref 30–100)

## 2015-09-09 LAB — INSULIN, FASTING: INSULIN FASTING, SERUM: 12.1 u[IU]/mL (ref 2.0–19.6)

## 2015-09-09 LAB — TSH: TSH: 0.92 m[IU]/L

## 2015-09-09 LAB — FERRITIN: FERRITIN: 61 ng/mL (ref 10–232)

## 2016-03-01 ENCOUNTER — Ambulatory Visit: Payer: Self-pay | Admitting: Physician Assistant

## 2016-03-06 ENCOUNTER — Other Ambulatory Visit: Payer: Self-pay | Admitting: Physician Assistant

## 2016-03-06 DIAGNOSIS — Z0001 Encounter for general adult medical examination with abnormal findings: Secondary | ICD-10-CM

## 2016-03-06 DIAGNOSIS — E785 Hyperlipidemia, unspecified: Secondary | ICD-10-CM

## 2016-03-17 ENCOUNTER — Ambulatory Visit (INDEPENDENT_AMBULATORY_CARE_PROVIDER_SITE_OTHER): Payer: BLUE CROSS/BLUE SHIELD | Admitting: Physician Assistant

## 2016-03-17 ENCOUNTER — Encounter: Payer: Self-pay | Admitting: Physician Assistant

## 2016-03-17 VITALS — BP 126/74 | HR 83 | Temp 97.6°F | Resp 16 | Ht 64.0 in | Wt 154.8 lb

## 2016-03-17 DIAGNOSIS — Z79899 Other long term (current) drug therapy: Secondary | ICD-10-CM | POA: Diagnosis not present

## 2016-03-17 DIAGNOSIS — E785 Hyperlipidemia, unspecified: Secondary | ICD-10-CM

## 2016-03-17 DIAGNOSIS — I10 Essential (primary) hypertension: Secondary | ICD-10-CM

## 2016-03-17 DIAGNOSIS — E559 Vitamin D deficiency, unspecified: Secondary | ICD-10-CM | POA: Diagnosis not present

## 2016-03-17 DIAGNOSIS — G43809 Other migraine, not intractable, without status migrainosus: Secondary | ICD-10-CM

## 2016-03-17 DIAGNOSIS — R7303 Prediabetes: Secondary | ICD-10-CM

## 2016-03-17 LAB — CBC WITH DIFFERENTIAL/PLATELET
BASOS PCT: 0 %
Basophils Absolute: 0 cells/uL (ref 0–200)
EOS ABS: 192 {cells}/uL (ref 15–500)
Eosinophils Relative: 2 %
HEMATOCRIT: 41.8 % (ref 35.0–45.0)
Hemoglobin: 14 g/dL (ref 11.7–15.5)
LYMPHS PCT: 35 %
Lymphs Abs: 3360 cells/uL (ref 850–3900)
MCH: 31.6 pg (ref 27.0–33.0)
MCHC: 33.5 g/dL (ref 32.0–36.0)
MCV: 94.4 fL (ref 80.0–100.0)
MONO ABS: 576 {cells}/uL (ref 200–950)
MONOS PCT: 6 %
MPV: 14 fL — AB (ref 7.5–12.5)
NEUTROS PCT: 57 %
Neutro Abs: 5472 cells/uL (ref 1500–7800)
PLATELETS: 171 10*3/uL (ref 140–400)
RBC: 4.43 MIL/uL (ref 3.80–5.10)
RDW: 13.5 % (ref 11.0–15.0)
WBC: 9.6 10*3/uL (ref 3.8–10.8)

## 2016-03-17 LAB — LIPID PANEL
CHOL/HDL RATIO: 4 ratio (ref <=5.0)
Cholesterol: 144 mg/dL (ref 125–200)
HDL: 36 mg/dL — AB (ref >=46)
LDL CALC: 79 mg/dL (ref <130)
Triglycerides: 144 mg/dL (ref <150)
VLDL: 29 mg/dL (ref <30)

## 2016-03-17 LAB — BASIC METABOLIC PANEL WITH GFR
BUN: 7 mg/dL (ref 7–25)
CALCIUM: 9.7 mg/dL (ref 8.6–10.2)
CO2: 29 mmol/L (ref 20–31)
CREATININE: 0.73 mg/dL (ref 0.50–1.10)
Chloride: 104 mmol/L (ref 98–110)
GFR, Est Non African American: >89 mL/min (ref >=60)
GLUCOSE: 79 mg/dL (ref 65–99)
POTASSIUM: 3.9 mmol/L (ref 3.5–5.3)
Sodium: 143 mmol/L (ref 135–146)

## 2016-03-17 LAB — HEPATIC FUNCTION PANEL
ALBUMIN: 4.7 g/dL (ref 3.6–5.1)
ALT: 11 U/L (ref 6–29)
AST: 14 U/L (ref 10–30)
Alkaline Phosphatase: 61 U/L (ref 33–115)
BILIRUBIN TOTAL: 0.3 mg/dL (ref 0.2–1.2)
Bilirubin, Direct: 0.1 mg/dL (ref <=0.2)
Indirect Bilirubin: 0.2 mg/dL (ref 0.2–1.2)
TOTAL PROTEIN: 7.1 g/dL (ref 6.1–8.1)

## 2016-03-17 LAB — MAGNESIUM: Magnesium: 2.3 mg/dL (ref 1.5–2.5)

## 2016-03-17 MED ORDER — CYCLOBENZAPRINE HCL 10 MG PO TABS: ORAL_TABLET | ORAL | 0 refills | Status: DC

## 2016-03-17 NOTE — Progress Notes (Signed)
Assessment and Plan:  1. Hypertension -Continue medication, monitor blood pressure at home. Continue DASH diet.  Reminder to go to the ER if any CP, SOB, nausea, dizziness, severe HA, changes vision/speech, left arm numbness and tingling and jaw pain.  2. Cholesterol -Continue diet and exercise. Check cholesterol.   3. Prediabetes  -Continue diet and exercise. Check A1C  4. Vitamin D Def - check level and continue medications.   5. Migraine Need to destress, no red flags, aleve PRN, flexeril at night x 1-2 weeks, if worse call office or go to ER.   Continue diet and meds as discussed. Further disposition pending results of labs. Over 30 minutes of exam, counseling, chart review, and critical decision making was performed  Future Appointments Date Time Provider [place]  09/08/2016 2:00 PM Vicie Mutters, PA-C [facility] None     HPI 40 y.o. female  presents for 3 month follow up on hypertension, cholesterol, prediabetes, and vitamin D deficiency.   Her blood pressure has been controlled at home, today their BP is BP: 126/74  She does not workout. She denies chest pain, shortness of breath, dizziness.  She is on cholesterol medication and denies myalgias. Her cholesterol is at goal. The cholesterol last visit was:   Lab Results  Component Value Date   CHOL 135 09/08/2015   HDL 32 (L) 09/08/2015   LDLCALC 83 09/08/2015   TRIG 102 09/08/2015   CHOLHDL 4.2 09/08/2015    She has been working on diet and exercise for prediabetes, and denies paresthesia of the feet, polydipsia, polyuria and visual disturbances. Last A1C in the office was:  Lab Results  Component Value Date   HGBA1C 5.8 (H) 09/08/2015   Patient is on Vitamin D supplement.   Lab Results  Component Value Date   VD25OH 68 09/08/2015     She will have headaches at least 2 x a month, will take tylenol and occ Excedrin, more right sided, no vision changes, + sensitive to light and sound at times. Have woken  up with them.    Current Medications:  Current Outpatient Prescriptions on File Prior to Visit  Medication Sig Dispense Refill  . buPROPion (WELLBUTRIN XL) 150 MG 24 hr tablet Take 1 tablet (150 mg total) by mouth every morning. 30 tablet 2  . cholecalciferol (VITAMIN D) 1000 UNITS tablet Take 2,000 Units by mouth daily.    . Cyanocobalamin (VITAMIN B 12 PO) Take by mouth daily.    . famotidine (PEPCID) 40 MG tablet Take 40 mg by mouth daily.    . fluticasone (FLONASE) 50 MCG/ACT nasal spray Place 2 sprays into both nostrils at bedtime. 16 g 1  . levocetirizine (XYZAL) 5 MG tablet Take 1 tablet by mouth  every evening 90 tablet 99  . loratadine (CLARITIN) 10 MG tablet Take 10 mg by mouth daily.    . Magnesium 250 MG TABS Take 250 mg by mouth daily.    . metoprolol tartrate (LOPRESSOR) 25 MG tablet Take 1 tablet (25 mg total) by mouth daily. 90 tablet 3  . simvastatin (ZOCOR) 40 MG tablet TAKE 1 TABLET (40 MG TOTAL) BY MOUTH DAILY. 90 tablet 0   No current facility-administered medications on file prior to visit.    Medical History:  Past Medical History:  Diagnosis Date  . Allergy   . Endometriosis   . GERD (gastroesophageal reflux disease)   . Hyperlipidemia   . Hypertension   . Migraine   . Prediabetes   . Vitamin  D deficiency    Allergies:  Allergies  Allergen Reactions  . Penicillins     rash     Review of Systems:  Review of Systems  Constitutional: Negative.   HENT: Negative for congestion (on claritin), ear discharge, ear pain, nosebleeds and sore throat.        + TMJ, wears night guard  Respiratory: Negative.  Negative for stridor.   Cardiovascular: Negative.   Gastrointestinal: Negative.   Genitourinary: Negative.   Musculoskeletal: Positive for back pain and myalgias (calves, at night). Negative for falls, joint pain and neck pain.  Skin: Negative.   Neurological: Positive for headaches. Negative for dizziness, sensory change and seizures.   Psychiatric/Behavioral: Negative.     Family history- Review and unchanged Social history- Review and unchanged Physical Exam: BP 126/74   Pulse 83   Temp 97.6 F (36.4 C)   Resp 16   Ht 5' 4"  (1.626 m)   Wt 154 lb 12.8 oz (70.2 kg)   LMP 03/15/2015   SpO2 97%   BMI 26.57 kg/m  Wt Readings from Last 3 Encounters:  03/17/16 154 lb 12.8 oz (70.2 kg)  09/08/15 153 lb 12.8 oz (69.8 kg)  04/14/15 151 lb (68.5 kg)   General Appearance: Well nourished, in no apparent distress. Eyes: PERRLA, EOMs, conjunctiva no swelling or erythema Sinuses: No Frontal/maxillary tenderness ENT/Mouth: Ext aud canals clear, TMs without erythema, bulging. No erythema, swelling, or exudate on post pharynx.  Tonsils not swollen or erythematous. Hearing normal.  Neck: Supple, thyroid normal.  Respiratory: Respiratory effort normal, BS equal bilaterally without rales, rhonchi, wheezing or stridor.  Cardio: RRR with no MRGs. Brisk peripheral pulses without edema.  Abdomen: Soft, + BS, nontender, no guarding, rebound, hernias, masses. Lymphatics: Non tender without lymphadenopathy.  Musculoskeletal: Full ROM, 5/5 strength, Normal gait Skin: Warm, dry without rashes, lesions, ecchymosis.  Neuro: Cranial nerves intact. Normal muscle tone, no cerebellar symptoms. Psych: Awake and oriented X 3, normal affect, Insight and Judgment appropriate.    Vicie Mutters, PA-C 11:51 AM [facility]

## 2016-03-18 LAB — HEMOGLOBIN A1C
HEMOGLOBIN A1C: 5.2 % (ref <5.7)
Mean Plasma Glucose: 103 mg/dL

## 2016-03-18 LAB — VITAMIN D 25 HYDROXY (VIT D DEFICIENCY, FRACTURES): Vit D, 25-Hydroxy: 79 ng/mL (ref 30–100)

## 2016-03-18 LAB — TSH: TSH: 0.81 mIU/L

## 2016-06-04 ENCOUNTER — Other Ambulatory Visit: Payer: Self-pay | Admitting: Internal Medicine

## 2016-06-04 DIAGNOSIS — Z0001 Encounter for general adult medical examination with abnormal findings: Secondary | ICD-10-CM

## 2016-06-04 DIAGNOSIS — E785 Hyperlipidemia, unspecified: Secondary | ICD-10-CM

## 2016-06-15 DIAGNOSIS — Z01419 Encounter for gynecological examination (general) (routine) without abnormal findings: Secondary | ICD-10-CM | POA: Diagnosis not present

## 2016-06-15 DIAGNOSIS — Z1231 Encounter for screening mammogram for malignant neoplasm of breast: Secondary | ICD-10-CM | POA: Diagnosis not present

## 2016-06-15 DIAGNOSIS — Z6827 Body mass index (BMI) 27.0-27.9, adult: Secondary | ICD-10-CM | POA: Diagnosis not present

## 2016-06-17 ENCOUNTER — Other Ambulatory Visit: Payer: Self-pay | Admitting: Obstetrics and Gynecology

## 2016-06-17 DIAGNOSIS — R928 Other abnormal and inconclusive findings on diagnostic imaging of breast: Secondary | ICD-10-CM

## 2016-06-25 ENCOUNTER — Ambulatory Visit
Admission: RE | Admit: 2016-06-25 | Discharge: 2016-06-25 | Disposition: A | Discharge status: Home / Self Care | Payer: BLUE CROSS/BLUE SHIELD | Source: Ambulatory Visit | Attending: Obstetrics and Gynecology | Admitting: Obstetrics and Gynecology

## 2016-06-25 ENCOUNTER — Other Ambulatory Visit: Payer: Self-pay | Admitting: Obstetrics and Gynecology

## 2016-06-25 DIAGNOSIS — R928 Other abnormal and inconclusive findings on diagnostic imaging of breast: Secondary | ICD-10-CM

## 2016-06-25 DIAGNOSIS — N6320 Unspecified lump in the left breast, unspecified quadrant: Secondary | ICD-10-CM

## 2016-06-25 DIAGNOSIS — N6322 Unspecified lump in the left breast, upper inner quadrant: Secondary | ICD-10-CM | POA: Diagnosis not present

## 2016-07-01 ENCOUNTER — Ambulatory Visit
Admission: RE | Admit: 2016-07-01 | Discharge: 2016-07-01 | Disposition: A | Discharge status: Home / Self Care | Payer: BLUE CROSS/BLUE SHIELD | Source: Ambulatory Visit | Attending: Obstetrics and Gynecology | Admitting: Obstetrics and Gynecology

## 2016-07-01 DIAGNOSIS — N6320 Unspecified lump in the left breast, unspecified quadrant: Secondary | ICD-10-CM | POA: Diagnosis not present

## 2016-07-01 DIAGNOSIS — D242 Benign neoplasm of left breast: Secondary | ICD-10-CM | POA: Diagnosis not present

## 2016-08-23 ENCOUNTER — Encounter: Payer: Self-pay | Admitting: Physician Assistant

## 2016-08-23 ENCOUNTER — Ambulatory Visit (INDEPENDENT_AMBULATORY_CARE_PROVIDER_SITE_OTHER): Payer: 59 | Admitting: Physician Assistant

## 2016-08-23 VITALS — BP 160/98 | HR 86 | Temp 97.3°F | Resp 16 | Ht 64.0 in | Wt 160.4 lb

## 2016-08-23 DIAGNOSIS — R202 Paresthesia of skin: Secondary | ICD-10-CM | POA: Diagnosis not present

## 2016-08-23 DIAGNOSIS — R5383 Other fatigue: Secondary | ICD-10-CM | POA: Diagnosis not present

## 2016-08-23 DIAGNOSIS — I10 Essential (primary) hypertension: Secondary | ICD-10-CM | POA: Diagnosis not present

## 2016-08-23 LAB — CBC WITH DIFFERENTIAL/PLATELET
Basophils Absolute: 0 cells/uL (ref 0–200)
Basophils Relative: 0 %
EOS ABS: 84 {cells}/uL (ref 15–500)
Eosinophils Relative: 1 %
HEMATOCRIT: 39.4 % (ref 35.0–45.0)
Hemoglobin: 13 g/dL (ref 11.7–15.5)
LYMPHS PCT: 36 %
Lymphs Abs: 3024 cells/uL (ref 850–3900)
MCH: 31.2 pg (ref 27.0–33.0)
MCHC: 33 g/dL (ref 32.0–36.0)
MCV: 94.5 fL (ref 80.0–100.0)
MONO ABS: 756 {cells}/uL (ref 200–950)
MONOS PCT: 9 %
MPV: 14.1 fL — AB (ref 7.5–12.5)
NEUTROS PCT: 54 %
Neutro Abs: 4536 cells/uL (ref 1500–7800)
PLATELETS: 145 10*3/uL (ref 140–400)
RBC: 4.17 MIL/uL (ref 3.80–5.10)
RDW: 13.1 % (ref 11.0–15.0)
WBC: 8.4 10*3/uL (ref 3.8–10.8)

## 2016-08-23 NOTE — Progress Notes (Signed)
Subjective:    Patient ID: Andrea Owens Patient, female    DOB: [birth year], 41 y.o.   MRN: [id number]  HPI 41 y.o. smoking WF presents with BP issues x 3 weeks. Has been trying to decrease salt, walk more and has not been down. She has had fatigue, getting jittery/anxious, she will have tingling/tightness hands and feet, throbbing HA., has had chills but no fever. Denies changes in vision, weakness, SOB, CP, AB pain, urinary issues, tremor. Patient's husband lost job Friday, had insurance change.   She had normal TSH 03/2016, normal iron, ferritin, urine, CBC, HIV. She also complains of weight gain. No tick exposure.   Wt Readings from Last 3 Encounters:  08/23/16 160 lb 6.4 oz (72.8 kg)  03/17/16 154 lb 12.8 oz (70.2 kg)  09/08/15 153 lb 12.8 oz (69.8 kg)   Blood pressure (!) 160/98, pulse 86, temperature 97.3 F (36.3 C), resp. rate 16, height 5' 4"  (1.626 m), weight 160 lb 6.4 oz (72.8 kg), last menstrual period 03/15/2015, SpO2 97 %.   Medications Current Outpatient Prescriptions on File Prior to Visit  Medication Sig  . cholecalciferol (VITAMIN D) 1000 UNITS tablet Take 2,000 Units by mouth daily.  . Cyanocobalamin (VITAMIN B 12 PO) Take by mouth daily.  . cyclobenzaprine (FLEXERIL) 10 MG tablet TAKE as needed at night for headache/neck  . famotidine (PEPCID) 40 MG tablet Take 40 mg by mouth daily.  . fluticasone (FLONASE) 50 MCG/ACT nasal spray Place 2 sprays into both nostrils at bedtime.  Marland Kitchen levocetirizine (XYZAL) 5 MG tablet Take 1 tablet by mouth  every evening  . loratadine (CLARITIN) 10 MG tablet Take 10 mg by mouth daily.  . Magnesium 250 MG TABS Take 250 mg by mouth daily.  . metoprolol tartrate (LOPRESSOR) 25 MG tablet Take 1 tablet (25 mg total) by mouth daily.  . simvastatin (ZOCOR) 40 MG tablet TAKE 1 TABLET DAILY   No current facility-administered medications on file prior to visit.     Problem list She has Hypertension; Hyperlipidemia; GERD (gastroesophageal reflux  disease); Vitamin D deficiency; Prediabetes; Gallbladder polyp; Endometriosis; Migraine; Lower back pain; and Tobacco use disorder on her problem list.  Review of Systems  Constitutional: Positive for chills and fatigue. Negative for activity change, appetite change, diaphoresis, fever and unexpected weight change.  HENT: Negative.   Respiratory: Negative.   Cardiovascular: Negative.   Gastrointestinal: Negative.   Genitourinary: Negative.   Musculoskeletal: Positive for myalgias. Negative for arthralgias, back pain, gait problem, joint swelling, neck pain and neck stiffness.  Skin: Negative.  Negative for rash.  Neurological: Positive for numbness and headaches. Negative for dizziness, tremors, seizures, syncope, facial asymmetry, speech difficulty, weakness and light-headedness.  Psychiatric/Behavioral: Positive for decreased concentration and sleep disturbance. Negative for agitation, behavioral problems, confusion, dysphoric mood, hallucinations, self-injury and suicidal ideas. The patient is nervous/anxious. The patient is not hyperactive.        Objective:   Physical Exam  Constitutional: She is oriented to person, place, and time. She appears well-developed and well-nourished.  HENT:  Head: Normocephalic and atraumatic.  Right Ear: External ear normal.  Left Ear: External ear normal.  Mouth/Throat: Oropharynx is clear and moist.  Eyes: Conjunctivae and EOM are normal. Pupils are equal, round, and reactive to light.  Neck: Normal range of motion. Neck supple. No thyromegaly present.  Cardiovascular: Normal rate, regular rhythm and normal heart sounds.  Exam reveals no gallop and no friction rub.   No murmur heard. Pulmonary/Chest: Effort normal and breath  sounds normal. No respiratory distress. She has no wheezes.  Abdominal: Soft. Bowel sounds are normal. She exhibits no distension and no mass. There is no tenderness. There is no rebound and no guarding.  Musculoskeletal: Normal  range of motion.  Lymphadenopathy:    She has no cervical adenopathy.  Neurological: She is alert and oriented to person, place, and time. She has normal strength. She displays no atrophy, no tremor and normal reflexes. No cranial nerve deficit or sensory deficit. She displays a negative Romberg sign. Coordination and gait normal.  Possible hyperreflexia  Skin: Skin is warm and dry.  Psychiatric: She has a normal mood and affect.       Assessment & Plan:   Essential hypertension Increase metoprolol 35m BID, may increase to 571mBID -     CBC with Differential/Platelet -     BASIC METABOLIC PANEL WITH GFR -     Hepatic function panel -     TSH  Fatigue, unspecified type Check labs -     TSH -     CK -     Sedimentation rate -     ANA -     Anti-DNA antibody, double-stranded  Paresthesias Check labs, if negative may need MRI rule out MS Versus ? anxiety -     TSH -     Magnesium -     Methylmalonic acid, serum -     CK -     Sedimentation rate -     ANA -     Anti-DNA antibody, double-stranded     Future Appointments Date Time Provider [place]2/28/2018 2:00 PM AmVicie MuttersPA-C [facility] None

## 2016-08-23 NOTE — Patient Instructions (Signed)
Start the metoprolol 75m do it twice a day Monitor BP, if still elevated (above 140/90) increase to 526mAM and 50PM Monitor BP Message with BP Friday or Monday next week  Go to the ER if any CP, SOB, nausea, dizziness, severe HA, changes vision/speech  Monitor your blood pressure at home. Go to the ER if any CP, SOB, nausea, dizziness, severe HA, changes vision/speech  Goal BP:  For patients younger than 60: Goal BP < 140/90. For patients 60 and older: Goal BP < 150/90. For patients with diabetes: Goal BP < 140/90. Your most recent BP: BP: (!) 160/98   Take your medications faithfully as instructed. Maintain a healthy weight. Get at least 150 minutes of aerobic exercise per week. Minimize salt intake. Minimize alcohol intake  DASH Eating Plan DASH stands for "Dietary Approaches to Stop Hypertension." The DASH eating plan is a healthy eating plan that has been shown to reduce high blood pressure (hypertension). Additional health benefits may include reducing the risk of type 2 diabetes mellitus, heart disease, and stroke. The DASH eating plan may also help with weight loss. WHAT DO I NEED TO KNOW ABOUT THE DASH EATING PLAN? For the DASH eating plan, you will follow these general guidelines:  Choose foods with a percent daily value for sodium of less than 5% (as listed on the food label).  Use salt-free seasonings or herbs instead of table salt or sea salt.  Check with your health care provider or pharmacist before using salt substitutes.  Eat lower-sodium products, often labeled as "lower sodium" or "no salt added."  Eat fresh foods.  Eat more vegetables, fruits, and low-fat dairy products.  Choose whole grains. Look for the word "whole" as the first word in the ingredient list.  Choose fish and skinless chicken or [place] often than red meat. Limit fish, poultry, and meat to 6 oz (170 g) each day.  Limit sweets, desserts, sugars, and sugary drinks.  Choose  heart-healthy fats.  Limit cheese to 1 oz (28 g) per day.  Eat more home-cooked food and less restaurant, buffet, and fast food.  Limit fried foods.  Cook foods using methods other than frying.  Limit canned vegetables. If you do use them, rinse them well to decrease the sodium.  When eating at a restaurant, ask that your food be prepared with less salt, or no salt if possible. WHAT FOODS CAN I EAT? Seek help from a dietitian for individual calorie needs. Grains Whole grain or whole wheat bread. Brown rice. Whole grain or whole wheat pasta. Quinoa, bulgur, and whole grain cereals. Low-sodium cereals. Corn or whole wheat flour tortillas. Whole grain cornbread. Whole grain crackers. Low-sodium crackers. Vegetables Fresh or frozen vegetables (raw, steamed, roasted, or grilled). Low-sodium or reduced-sodium tomato and vegetable juices. Low-sodium or reduced-sodium tomato sauce and paste. Low-sodium or reduced-sodium canned vegetables.  Fruits All fresh, canned (in natural juice), or frozen fruits. Meat and Other Protein Products Ground beef (85% or leaner), grass-fed beef, or beef trimmed of fat. Skinless chicken or [place] chicken or tu[place] trimmed of fat. All fish and seafood. Eggs. Dried beans, peas, or lentils. Unsalted nuts and seeds. Unsalted canned beans. Dairy Low-fat dairy products, such as skim or 1% milk, 2% or reduced-fat cheeses, low-fat ricotta or cottage cheese, or plain low-fat yogurt. Low-sodium or reduced-sodium cheeses. Fats and Oils Tub margarines without trans fats. Light or reduced-fat mayonnaise and salad dressings (reduced sodium). Avocado. Safflower, olive, or canola oils. Natural peanut or  almond butter. Other Unsalted popcorn and pretzels. The items listed above may not be a complete list of recommended foods or beverages. Contact your dietitian for more options. WHAT FOODS ARE NOT RECOMMENDED? Grains White bread. White pasta. White rice. Refined  cornbread. Bagels and croissants. Crackers that contain trans fat. Vegetables Creamed or fried vegetables. Vegetables in a cheese sauce. Regular canned vegetables. Regular canned tomato sauce and paste. Regular tomato and vegetable juices. Fruits Dried fruits. Canned fruit in light or heavy syrup. Fruit juice. Meat and Other Protein Products Fatty cuts of meat. Ribs, chicken wings, bacon, sausage, bologna, salami, chitterlings, fatback, hot dogs, bratwurst, and packaged luncheon meats. Salted nuts and seeds. Canned beans with salt. Dairy Whole or 2% milk, cream, half-and-half, and cream cheese. Whole-fat or sweetened yogurt. Full-fat cheeses or blue cheese. Nondairy creamers and whipped toppings. Processed cheese, cheese spreads, or cheese curds. Condiments Onion and garlic salt, seasoned salt, table salt, and sea salt. Canned and packaged gravies. Worcestershire sauce. Tartar sauce. Barbecue sauce. Teriyaki sauce. Soy sauce, including reduced sodium. Steak sauce. Fish sauce. Oyster sauce. Cocktail sauce. Horseradish. Ketchup and mustard. Meat flavorings and tenderizers. Bouillon cubes. Hot sauce. Tabasco sauce. Marinades. Taco seasonings. Relishes. Fats and Oils Butter, stick margarine, lard, shortening, ghee, and bacon fat. Coconut, palm kernel, or palm oils. Regular salad dressings. Other Pickles and olives. Salted popcorn and pretzels. The items listed above may not be a complete list of foods and beverages to avoid. Contact your dietitian for more information. WHERE CAN I FIND MORE INFORMATION? National Heart, Lung, and Blood Institute: travelstabloid.com Document Released: 06/17/2011 Document Revised: 11/12/2013 Document Reviewed: 05/02/2013 [facility] Patient Information 2015 [place], [place]. This information is not intended to replace advice given to you by your health care provider. Make sure you discuss any questions you have with your health care  provider.

## 2016-08-24 LAB — CK: Total CK: 44 U/L (ref 7–177)

## 2016-08-24 LAB — MAGNESIUM: MAGNESIUM: 2.1 mg/dL (ref 1.5–2.5)

## 2016-08-24 LAB — HEPATIC FUNCTION PANEL
ALBUMIN: 4.5 g/dL (ref 3.6–5.1)
ALT: 13 U/L (ref 6–29)
AST: 14 U/L (ref 10–30)
Alkaline Phosphatase: 62 U/L (ref 33–115)
Bilirubin, Direct: 0 mg/dL (ref <=0.2)
Indirect Bilirubin: 0.2 mg/dL (ref 0.2–1.2)
TOTAL PROTEIN: 6.5 g/dL (ref 6.1–8.1)
Total Bilirubin: 0.2 mg/dL (ref 0.2–1.2)

## 2016-08-24 LAB — BASIC METABOLIC PANEL WITH GFR
BUN: 11 mg/dL (ref 7–25)
CALCIUM: 9.6 mg/dL (ref 8.6–10.2)
CO2: 28 mmol/L (ref 20–31)
CREATININE: 0.7 mg/dL (ref 0.50–1.10)
Chloride: 107 mmol/L (ref 98–110)
GFR, Est Non African American: >89 mL/min (ref >=60)
GLUCOSE: 84 mg/dL (ref 65–99)
Potassium: 4 mmol/L (ref 3.5–5.3)
Sodium: 143 mmol/L (ref 135–146)

## 2016-08-24 LAB — ANA: Anti Nuclear Antibody(ANA): NEGATIVE

## 2016-08-24 LAB — ANTI-DNA ANTIBODY, DOUBLE-STRANDED

## 2016-08-24 LAB — SEDIMENTATION RATE: Sed Rate: 5 mm/hr (ref 0–20)

## 2016-08-24 LAB — TSH: TSH: 0.89 m[IU]/L

## 2016-08-26 LAB — METHYLMALONIC ACID, SERUM: METHYLMALONIC ACID, QUANT: 119 nmol/L (ref 87–318)

## 2016-08-30 ENCOUNTER — Encounter: Payer: Self-pay | Admitting: Physician Assistant

## 2016-09-08 ENCOUNTER — Encounter: Payer: Self-pay | Admitting: Physician Assistant

## 2016-09-08 ENCOUNTER — Ambulatory Visit (INDEPENDENT_AMBULATORY_CARE_PROVIDER_SITE_OTHER): Payer: 59 | Admitting: Physician Assistant

## 2016-09-08 VITALS — BP 128/100 | HR 71 | Temp 97.9°F | Resp 16 | Ht 64.0 in | Wt 162.4 lb

## 2016-09-08 DIAGNOSIS — Z9109 Other allergy status, other than to drugs and biological substances: Secondary | ICD-10-CM

## 2016-09-08 DIAGNOSIS — E559 Vitamin D deficiency, unspecified: Secondary | ICD-10-CM | POA: Diagnosis not present

## 2016-09-08 DIAGNOSIS — M5441 Lumbago with sciatica, right side: Secondary | ICD-10-CM | POA: Diagnosis not present

## 2016-09-08 DIAGNOSIS — E785 Hyperlipidemia, unspecified: Secondary | ICD-10-CM

## 2016-09-08 DIAGNOSIS — R2689 Other abnormalities of gait and mobility: Secondary | ICD-10-CM

## 2016-09-08 DIAGNOSIS — R6889 Other general symptoms and signs: Secondary | ICD-10-CM

## 2016-09-08 DIAGNOSIS — R7303 Prediabetes: Secondary | ICD-10-CM | POA: Diagnosis not present

## 2016-09-08 DIAGNOSIS — H9192 Unspecified hearing loss, left ear: Secondary | ICD-10-CM

## 2016-09-08 DIAGNOSIS — Z0001 Encounter for general adult medical examination with abnormal findings: Secondary | ICD-10-CM

## 2016-09-08 DIAGNOSIS — G43809 Other migraine, not intractable, without status migrainosus: Secondary | ICD-10-CM

## 2016-09-08 DIAGNOSIS — N809 Endometriosis, unspecified: Secondary | ICD-10-CM | POA: Diagnosis not present

## 2016-09-08 DIAGNOSIS — K824 Cholesterolosis of gallbladder: Secondary | ICD-10-CM

## 2016-09-08 DIAGNOSIS — F17200 Nicotine dependence, unspecified, uncomplicated: Secondary | ICD-10-CM

## 2016-09-08 DIAGNOSIS — R202 Paresthesia of skin: Secondary | ICD-10-CM

## 2016-09-08 DIAGNOSIS — I10 Essential (primary) hypertension: Secondary | ICD-10-CM | POA: Diagnosis not present

## 2016-09-08 DIAGNOSIS — K2100 Gastro-esophageal reflux disease with esophagitis, without bleeding: Secondary | ICD-10-CM

## 2016-09-08 MED ORDER — LEVOCETIRIZINE DIHYDROCHLORIDE 5 MG PO TABS: ORAL_TABLET | ORAL | 99 refills | Status: DC

## 2016-09-08 MED ORDER — HYDROCHLOROTHIAZIDE 25 MG PO TABS: 25.0000 mg | ORAL_TABLET | Freq: Every day | ORAL | 3 refills | Status: DC

## 2016-09-08 MED ORDER — METOPROLOL TARTRATE 50 MG PO TABS: 50.0000 mg | ORAL_TABLET | Freq: Two times a day (BID) | ORAL | 5 refills | Status: DC

## 2016-09-08 MED ORDER — METOPROLOL TARTRATE 50 MG PO TABS: 50.0000 mg | ORAL_TABLET | Freq: Two times a day (BID) | ORAL | 1 refills | Status: DC

## 2016-09-08 MED ORDER — SIMVASTATIN 40 MG PO TABS: 40.0000 mg | ORAL_TABLET | Freq: Every day | ORAL | 1 refills | Status: DC

## 2016-09-08 NOTE — Progress Notes (Signed)
Complete Physical  Assessment and Plan: Essential hypertension - continue medications, DASH diet, exercise and monitor at home. Call if greater than 130/80.  -had labs last OV - add HCTZ - Urinalysis, Routine w reflex microscopic (not at [facility]) - Microalbumin / creatinine urine ratio  Hyperlipidemia -continue medications, check lipids, decrease fatty foods, increase activity.  - simvastatin (ZOCOR) 40 MG tablet; Take 1 tablet (40 mg total) by mouth daily.  Dispense: 90 tablet; Refill: 1   Prediabetes Discussed general issues about diabetes pathophysiology and management., Educational material distributed., Suggested low cholesterol diet., Encouraged aerobic exercise., Discussed foot care., Reminded to get yearly retinal exam.  Vitamin D deficiency - VITAMIN D 25 Hydroxy (Vit-D Deficiency, Fractures)  Gastroesophageal reflux disease with esophagitis Diet controlled  Gallbladder polyp   Other migraine without status migrainosus, not intractable Better s/p hysterectomy  Right-sided low back pain with right-sided sciatica Will call if wants PT   Medication management - Magnesium   Encounter for general adult medical examination with abnormal findings   Tobacco use disorder Smoking cessation instruction/counseling given:  counseled patient on the dangers of tobacco use, advised patient to stop smoking, and reviewed strategies to maximize success  paraesthesias Labs normal, ? From neck/back, however will check MRI, rule out MS, mass, etc   Environmental allergies - levocetirizine (XYZAL) 5 MG tablet; Take 1 tablet by mouth  every evening  Dispense: 90 tablet; Refill: 99  Dizziness/tinnitus Add HCTZ possibly minere's, get MRI rule out acoustic neuroma  Discussed med's effects and SE's. Screening labs and tests as requested with regular follow-up as recommended.  HPI Patient presents for complete physical.   Patient's blood pressure has not been controlled at home, she is  on 27m metoprolol BID, still elevated. She continues to smoke. Patient denies chest pain, shortness of breath, dizziness. BP: (!) 128/100.  Lab Results  Component Value Date   CREATININE 0.70 08/23/2016   BUN 11 08/23/2016   NA 143 08/23/2016   K 4.0 08/23/2016   CL 107 08/23/2016   CO2 28 08/23/2016   She continue to have tingling/tightness bilateral hands and feet, throbbing head some improvement but worse through out the day/with activity, will with fatigue, anxiety. Denies changes, in vision, weakness.  She had normal TSH 03/2016, normal iron, ferritin, urine, CBC, HIV, ANA, antiDNA, CPK,  Patient's cholesterol is diet controlled. She is on zocor and denies myalgias. The patient's cholesterol last visit was Lab Results  Component Value Date   CHOL 144 03/17/2016   HDL 36 (L) 03/17/2016   LDLCALC 79 03/17/2016   TRIG 144 03/17/2016   CHOLHDL 4.0 03/17/2016   The patient has been working on diet and exercise for prediabetes, denies changes in vision, polys, and paresthesias. Last A1C in office  Lab Results  Component Value Date   HGBA1C 5.2 03/17/2016   Thrombocytopenia history Lab Results  Component Value Date   WBC 8.4 08/23/2016   HGB 13.0 08/23/2016   HCT 39.4 08/23/2016   MCV 94.5 08/23/2016   PLT 145 08/23/2016   She had EGD and was treated for H pylori by Dr. PHilarie Fredricksonin 2015.  She has continuing lower back pain.  States she had fractured pelvis back in 1992. Normal lumbar Xray 2013.  BMI is Body mass index is 27.88 kg/m., she is working on diet and exercise. Wt Readings from Last 3 Encounters:  09/08/16 162 lb 6.4 oz (73.7 kg)  08/23/16 160 lb 6.4 oz (72.8 kg)  03/17/16 154 lb 12.8 oz (70.2  kg)   Current Medications:  Current Outpatient Prescriptions on File Prior to Visit  Medication Sig Dispense Refill  . cholecalciferol (VITAMIN D) 1000 UNITS tablet Take 2,000 Units by mouth daily.    . Cyanocobalamin (VITAMIN B 12 PO) Take by mouth daily.    .  cyclobenzaprine (FLEXERIL) 10 MG tablet TAKE as needed at night for headache/neck 30 tablet 0  . famotidine (PEPCID) 40 MG tablet Take 40 mg by mouth daily.    . fluticasone (FLONASE) 50 MCG/ACT nasal spray Place 2 sprays into both nostrils at bedtime. 16 g 1  . levocetirizine (XYZAL) 5 MG tablet Take 1 tablet by mouth  every evening 90 tablet 99  . loratadine (CLARITIN) 10 MG tablet Take 10 mg by mouth daily.    . Magnesium 250 MG TABS Take 250 mg by mouth daily.    . metoprolol tartrate (LOPRESSOR) 25 MG tablet Take 1 tablet (25 mg total) by mouth daily. 90 tablet 3  . simvastatin (ZOCOR) 40 MG tablet TAKE 1 TABLET DAILY 90 tablet 1   No current facility-administered medications on file prior to visit.    Health Maintenance: Immunization History  Administered Date(s) Administered  . Tdap 07/17/2012     Tetanus: 07/2012 Pneumovax: N/A Flu vaccine: declines  Zostavax: N/A Patient's last menstrual period was 03/15/2015. s/p hysterectomy Pap: 2016 Dr. Corinna Capra s/p hysterectomy in 2016 3D MGM:  06/2016 category C DEXA: N/A  Colonoscopy:N/A  EGD: 09/2013 Lumbar Xray 2013 CXR 2009  Patient Care Team: Unk Pinto, MD as PCP - General (Internal Medicine) Jerene Bears, MD as Consulting Physician (Gastroenterology) Louretta Shorten, MD as Consulting Physician (Obstetrics and Gynecology)  Medical History:  Past Medical History:  Diagnosis Date  . Allergy   . Endometriosis   . GERD (gastroesophageal reflux disease)   . Hyperlipidemia   . Hypertension   . Migraine   . Prediabetes   . Vitamin D deficiency    Allergies Allergies  Allergen Reactions  . Penicillins     rash    SURGICAL HISTORY She  has a past surgical history that includes Refractive surgery (2004) and Abdominal hysterectomy (03/2015). FAMILY HISTORY Her family history includes Breast cancer in her maternal aunt; Heart disease in her father; Hyperlipidemia in her father and mother; Hypertension in her father;  Irritable bowel syndrome in her mother; Prostate cancer in her maternal grandfather. SOCIAL HISTORY She  reports that she has been smoking.  She has a 10.00 pack-year smoking history. She has never used smokeless tobacco. She reports that she does not drink alcohol or use drugs.   ROS Review of Systems  Constitutional: Positive for chills and malaise/fatigue. Negative for diaphoresis, fever and weight loss.  HENT: Positive for hearing loss (left side) and tinnitus (left ear). Negative for congestion, ear discharge, ear pain, nosebleeds, sinus pain and sore throat.        + TMJ, wears night guard  Eyes: Negative.  Negative for blurred vision and double vision.  Respiratory: Negative.  Negative for cough, hemoptysis, sputum production, shortness of breath, wheezing and stridor.   Cardiovascular: Positive for leg swelling. Negative for chest pain, palpitations, orthopnea, claudication and PND.  Gastrointestinal: Negative.   Genitourinary: Negative.  Negative for frequency.  Musculoskeletal: Positive for back pain. Negative for falls, joint pain, myalgias and neck pain.  Skin: Negative.  Negative for itching and rash.  Neurological: Positive for tingling and headaches. Negative for dizziness, tremors, sensory change, speech change, focal weakness, seizures, loss of consciousness and  weakness.  Endo/Heme/Allergies: Negative.   Psychiatric/Behavioral: Negative for depression, hallucinations, memory loss, substance abuse and suicidal ideas. The patient is nervous/anxious. The patient does not have insomnia.    Physical Exam: Estimated body mass index is 27.88 kg/m as calculated from the following:   Height as of this encounter: 5' 4"  (1.626 m).   Weight as of this encounter: 162 lb 6.4 oz (73.7 kg). Vitals:   09/08/16 1344  BP: (!) 128/100  Pulse: 71  Resp: 16  Temp: 97.9 F (36.6 C)   General Appearance: Well nourished, in no apparent distress. Eyes: PERRLA, EOMs, conjunctiva no swelling  or erythema, normal fundi and vessels. Sinuses: No Frontal/maxillary tenderness ENT/Mouth: Ext aud canals clear, normal light reflex with TMs without erythema, bulging.  Good dentition. No erythema, swelling, or exudate on post pharynx. Tonsils not swollen or erythematous. Hearing normal.  Neck: Supple, thyroid normal. No bruits Respiratory: Respiratory effort normal, BS equal bilaterally without rales, rhonchi, wheezing or stridor. Cardio: RRR without murmurs, rubs or gallops. Brisk peripheral pulses without edema.  Chest: symmetric, with normal excursions and percussion. Breasts: defer OB/GYN Abdomen: Soft, +BS, nontender no guarding, rebound, hernias, masses, or organomegaly. .  Lymphatics: Non tender without lymphadenopathy.  Genitourinary: defer Musculoskeletal: Full ROM all peripheral extremities,5/5 strength, and normal gait. Skin: Warm, dry without rashes, lesions, ecchymosis.  Neuro: Cranial nerves intact, reflexes equal bilaterally. Normal muscle tone, no cerebellar symptoms. Sensation intact.  Psych: Awake and oriented X 3, normal affect, Insight and Judgment appropriate.   EKG: defer had prior to surgery    Vicie Mutters 1:55 PM

## 2016-09-09 LAB — URINALYSIS, ROUTINE W REFLEX MICROSCOPIC
BILIRUBIN URINE: NEGATIVE
Glucose, UA: NEGATIVE
Hgb urine dipstick: NEGATIVE
Ketones, ur: NEGATIVE
LEUKOCYTES UA: NEGATIVE
Nitrite: NEGATIVE
PROTEIN: NEGATIVE
Specific Gravity, Urine: 1.01 (ref 1.001–1.035)
pH: 6 (ref 5.0–8.0)

## 2016-09-09 LAB — MICROALBUMIN / CREATININE URINE RATIO
CREATININE, URINE: 69 mg/dL (ref 20–320)
MICROALB UR: 0.4 mg/dL
MICROALB/CREAT RATIO: 6 ug/mg{creat} (ref <30)

## 2016-09-22 ENCOUNTER — Ambulatory Visit
Admission: RE | Admit: 2016-09-22 | Discharge: 2016-09-22 | Disposition: A | Discharge status: Home / Self Care | Payer: 59 | Source: Ambulatory Visit | Attending: Physician Assistant | Admitting: Physician Assistant

## 2016-09-22 DIAGNOSIS — H9192 Unspecified hearing loss, left ear: Secondary | ICD-10-CM

## 2016-09-22 DIAGNOSIS — R2689 Other abnormalities of gait and mobility: Secondary | ICD-10-CM

## 2016-09-22 DIAGNOSIS — R202 Paresthesia of skin: Secondary | ICD-10-CM

## 2016-11-15 ENCOUNTER — Other Ambulatory Visit: Payer: Self-pay | Admitting: Physician Assistant

## 2016-11-15 DIAGNOSIS — D649 Anemia, unspecified: Secondary | ICD-10-CM

## 2016-11-15 DIAGNOSIS — Z0001 Encounter for general adult medical examination with abnormal findings: Secondary | ICD-10-CM

## 2016-12-06 ENCOUNTER — Other Ambulatory Visit: Payer: Self-pay | Admitting: Physician Assistant

## 2016-12-06 DIAGNOSIS — Z0001 Encounter for general adult medical examination with abnormal findings: Secondary | ICD-10-CM

## 2016-12-06 DIAGNOSIS — Z9109 Other allergy status, other than to drugs and biological substances: Secondary | ICD-10-CM

## 2016-12-12 NOTE — Progress Notes (Signed)
Assessment and Plan:   Hypertension -Continue medication, monitor blood pressure at home. Continue DASH diet.  Reminder to go to the ER if any CP, SOB, nausea, dizziness, severe HA, changes vision/speech, left arm numbness and tingling and jaw pain.  Cholesterol -Continue diet and exercise. Check cholesterol.   Prediabetes  -Continue diet and exercise. Check A1C   Vitamin D Def - check level and continue medications.    Continue diet and meds as discussed. Further disposition pending results of labs. Over 30 minutes of exam, counseling, chart review, and critical decision making was performed  Future Appointments Date Time Provider [place]  09/12/2017 2:00 PM Vicie Mutters, PA-C [facility] None     HPI 41 y.o. female  presents for 3 month follow up on hypertension, cholesterol, prediabetes, and vitamin D deficiency.   Her blood pressure has been controlled at home, she is not on HCTZ at this time, today their BP is BP: 136/90  She does not workout. She denies chest pain, shortness of breath, dizziness.  She is on cholesterol medication and denies myalgias. Her cholesterol is at goal. The cholesterol last visit was:   Lab Results  Component Value Date   CHOL 144 03/17/2016   HDL 36 (L) 03/17/2016   LDLCALC 79 03/17/2016   TRIG 144 03/17/2016   CHOLHDL 4.0 03/17/2016    She has been working on diet and exercise for prediabetes, and denies paresthesia of the feet, polydipsia, polyuria and visual disturbances. Last A1C in the office was:  Lab Results  Component Value Date   HGBA1C 5.2 03/17/2016   Patient is on Vitamin D supplement.   Lab Results  Component Value Date   VD25OH 79 03/17/2016     Patient has has paraesthias, migraine, normal MRI, states doing better BMI is Body mass index is 27.74 kg/m., she is working on diet and exercise. Wt Readings from Last 3 Encounters:  12/13/16 161 lb 9.6 oz (73.3 kg)  09/08/16 162 lb 6.4 oz (73.7 kg)  08/23/16 160 lb  6.4 oz (72.8 kg)    Current Medications:  Current Outpatient Prescriptions on File Prior to Visit  Medication Sig Dispense Refill  . cholecalciferol (VITAMIN D) 1000 UNITS tablet Take 2,000 Units by mouth daily.    . Cyanocobalamin (VITAMIN B 12 PO) Take by mouth daily.    . cyclobenzaprine (FLEXERIL) 10 MG tablet TAKE as needed at night for headache/neck 30 tablet 0  . famotidine (PEPCID) 40 MG tablet Take 40 mg by mouth daily.    . fluticasone (FLONASE) 50 MCG/ACT nasal spray Place 2 sprays into both nostrils at bedtime. 16 g 1  . levocetirizine (XYZAL) 5 MG tablet TAKE 1 TABLET BY MOUTH EVERY EVENING 90 tablet 3  . loratadine (CLARITIN) 10 MG tablet Take 10 mg by mouth daily.    . Magnesium 250 MG TABS Take 250 mg by mouth daily.     No current facility-administered medications on file prior to visit.    Medical History:  Past Medical History:  Diagnosis Date  . Allergy   . Endometriosis   . GERD (gastroesophageal reflux disease)   . Hyperlipidemia   . Hypertension   . Migraine   . Prediabetes   . Vitamin D deficiency    Allergies:  Allergies  Allergen Reactions  . Penicillins     rash     Review of Systems:  Review of Systems  Constitutional: Negative.   HENT: Negative for congestion (on claritin), ear discharge, ear pain, nosebleeds  and sore throat.        + TMJ, wears night guard  Respiratory: Negative.  Negative for stridor.   Cardiovascular: Negative.   Gastrointestinal: Negative.   Genitourinary: Negative.   Musculoskeletal: Positive for back pain and myalgias (calves, at night). Negative for falls, joint pain and neck pain.  Skin: Negative.   Neurological: Positive for headaches. Negative for dizziness, sensory change and seizures.  Psychiatric/Behavioral: Negative.     Family history- Review and unchanged Social history- Review and unchanged Physical Exam: BP 136/90   Pulse 81   Temp 97.9 F (36.6 C)   Resp 18   Ht 5' 4"  (1.626 m)   Wt 161 lb 9.6  oz (73.3 kg)   LMP 03/15/2015   SpO2 97%   BMI 27.74 kg/m  Wt Readings from Last 3 Encounters:  12/13/16 161 lb 9.6 oz (73.3 kg)  09/08/16 162 lb 6.4 oz (73.7 kg)  08/23/16 160 lb 6.4 oz (72.8 kg)   General Appearance: Well nourished, in no apparent distress. Eyes: PERRLA, EOMs, conjunctiva no swelling or erythema Sinuses: No Frontal/maxillary tenderness ENT/Mouth: Ext aud canals clear, TMs without erythema, bulging. No erythema, swelling, or exudate on post pharynx.  Tonsils not swollen or erythematous. Hearing normal.  Neck: Supple, thyroid normal.  Respiratory: Respiratory effort normal, BS equal bilaterally without rales, rhonchi, wheezing or stridor.  Cardio: RRR with no MRGs. Brisk peripheral pulses without edema.  Abdomen: Soft, + BS, nontender, no guarding, rebound, hernias, masses. Lymphatics: Non tender without lymphadenopathy.  Musculoskeletal: Full ROM, 5/5 strength, Normal gait Skin: Warm, dry without rashes, lesions, ecchymosis.  Neuro: Cranial nerves intact. Normal muscle tone, no cerebellar symptoms. Psych: Awake and oriented X 3, normal affect, Insight and Judgment appropriate.    Vicie Mutters, PA-C 3:44 PM [facility]

## 2016-12-13 ENCOUNTER — Other Ambulatory Visit: Payer: Self-pay

## 2016-12-13 ENCOUNTER — Ambulatory Visit (INDEPENDENT_AMBULATORY_CARE_PROVIDER_SITE_OTHER): Payer: 59 | Admitting: Physician Assistant

## 2016-12-13 ENCOUNTER — Encounter: Payer: Self-pay | Admitting: Physician Assistant

## 2016-12-13 VITALS — BP 136/90 | HR 81 | Temp 97.9°F | Resp 18 | Ht 64.0 in | Wt 161.6 lb

## 2016-12-13 DIAGNOSIS — F17200 Nicotine dependence, unspecified, uncomplicated: Secondary | ICD-10-CM

## 2016-12-13 DIAGNOSIS — Z79899 Other long term (current) drug therapy: Secondary | ICD-10-CM | POA: Diagnosis not present

## 2016-12-13 DIAGNOSIS — E785 Hyperlipidemia, unspecified: Secondary | ICD-10-CM | POA: Diagnosis not present

## 2016-12-13 DIAGNOSIS — E559 Vitamin D deficiency, unspecified: Secondary | ICD-10-CM

## 2016-12-13 DIAGNOSIS — I10 Essential (primary) hypertension: Secondary | ICD-10-CM | POA: Diagnosis not present

## 2016-12-13 DIAGNOSIS — Z0001 Encounter for general adult medical examination with abnormal findings: Secondary | ICD-10-CM

## 2016-12-13 LAB — CBC WITH DIFFERENTIAL/PLATELET
BASOS ABS: 0 {cells}/uL (ref 0–200)
Basophils Relative: 0 %
EOS ABS: 92 {cells}/uL (ref 15–500)
Eosinophils Relative: 1 %
HEMATOCRIT: 40.2 % (ref 35.0–45.0)
HEMOGLOBIN: 13.3 g/dL (ref 11.7–15.5)
LYMPHS ABS: 3404 {cells}/uL (ref 850–3900)
Lymphocytes Relative: 37 %
MCH: 31.7 pg (ref 27.0–33.0)
MCHC: 33.1 g/dL (ref 32.0–36.0)
MCV: 95.9 fL (ref 80.0–100.0)
MONO ABS: 644 {cells}/uL (ref 200–950)
MONOS PCT: 7 %
MPV: 13.4 fL — ABNORMAL HIGH (ref 7.5–12.5)
NEUTROS ABS: 5060 {cells}/uL (ref 1500–7800)
Neutrophils Relative %: 55 %
Platelets: 154 10*3/uL (ref 140–400)
RBC: 4.19 MIL/uL (ref 3.80–5.10)
RDW: 13.3 % (ref 11.0–15.0)
WBC: 9.2 10*3/uL (ref 3.8–10.8)

## 2016-12-13 LAB — TSH: TSH: 0.7 m[IU]/L

## 2016-12-13 MED ORDER — HYDROCHLOROTHIAZIDE 25 MG PO TABS: 25.0000 mg | ORAL_TABLET | Freq: Every day | ORAL | 3 refills | Status: DC

## 2016-12-13 MED ORDER — SIMVASTATIN 40 MG PO TABS: 40.0000 mg | ORAL_TABLET | Freq: Every day | ORAL | 1 refills | Status: DC

## 2016-12-13 MED ORDER — METOPROLOL TARTRATE 50 MG PO TABS: 50.0000 mg | ORAL_TABLET | Freq: Two times a day (BID) | ORAL | 1 refills | Status: DC

## 2016-12-14 LAB — LIPID PANEL
Cholesterol: 127 mg/dL (ref <200)
HDL: 30 mg/dL — ABNORMAL LOW (ref >50)
LDL CALC: 57 mg/dL (ref <100)
Total CHOL/HDL Ratio: 4.2 Ratio (ref <5.0)
Triglycerides: 199 mg/dL — ABNORMAL HIGH (ref <150)
VLDL: 40 mg/dL — ABNORMAL HIGH (ref <30)

## 2016-12-14 LAB — BASIC METABOLIC PANEL WITH GFR
BUN: 14 mg/dL (ref 7–25)
CO2: 21 mmol/L (ref 20–31)
CREATININE: 0.69 mg/dL (ref 0.50–1.10)
Calcium: 9.3 mg/dL (ref 8.6–10.2)
Chloride: 107 mmol/L (ref 98–110)
GFR, Est African American: >89 mL/min (ref >=60)
GFR, Est Non African American: >89 mL/min (ref >=60)
GLUCOSE: 96 mg/dL (ref 65–99)
Potassium: 4 mmol/L (ref 3.5–5.3)
Sodium: 142 mmol/L (ref 135–146)

## 2016-12-14 LAB — HEPATIC FUNCTION PANEL
ALBUMIN: 4.1 g/dL (ref 3.6–5.1)
ALT: 10 U/L (ref 6–29)
AST: 12 U/L (ref 10–30)
Alkaline Phosphatase: 57 U/L (ref 33–115)
Bilirubin, Direct: 0.1 mg/dL (ref <=0.2)
Indirect Bilirubin: 0.3 mg/dL (ref 0.2–1.2)
Total Bilirubin: 0.4 mg/dL (ref 0.2–1.2)
Total Protein: 6.4 g/dL (ref 6.1–8.1)

## 2016-12-14 LAB — MAGNESIUM: Magnesium: 2.1 mg/dL (ref 1.5–2.5)

## 2017-04-15 NOTE — Progress Notes (Signed)
Assessment and Plan:   Hypertension -Continue medication, monitor blood pressure at home. Continue DASH diet.  Reminder to go to the ER if any CP, SOB, nausea, dizziness, severe HA, changes vision/speech, left arm numbness and tingling and jaw pain.  Cholesterol -Continue diet and exercise. Check cholesterol.    Vitamin D Def - check level and continue medications.   Plantar's wart left foot - has failed OTC treatment, will schedule OV here for removal  Grief Declines meds at this time, has good support system.   Continue diet and meds as discussed. Further disposition pending results of labs. Over 30 minutes of exam, counseling, chart review, and critical decision making was performed  Future Appointments Date Time Provider [place]  09/12/2017 2:00 PM Vicie Mutters, PA-C [facility] None     HPI 41 y.o. female  presents for 6 month follow up on hypertension, cholesterol, prediabetes, and vitamin D deficiency.   Her blood pressure has been controlled at home, she is not on HCTZ at this time, today their BP is BP: 126/80  She does not workout. She denies chest pain, shortness of breath, dizziness. She has had intermittent pain x several month on the bottom lateral left foot, not getting better.  Husband passed suddenly, crush injury with patient finding him, in July. Has support from work and church.   She is on cholesterol medication and denies myalgias. Her cholesterol is at goal. The cholesterol last visit was:   Lab Results  Component Value Date   CHOL 127 12/13/2016   HDL 30 (L) 12/13/2016   LDLCALC 57 12/13/2016   TRIG 199 (H) 12/13/2016   CHOLHDL 4.2 12/13/2016    She has been working on diet and exercise for prediabetes, and denies paresthesia of the feet, polydipsia, polyuria and visual disturbances. Last A1C in the office was:  Lab Results  Component Value Date   HGBA1C 5.2 03/17/2016   Patient is on Vitamin D supplement.   Lab Results  Component Value  Date   VD25OH 79 03/17/2016     Patient has had paraesthias, migraine, normal MRI, states doing better BMI is Body mass index is 25.16 kg/m., she is working on diet and exercise. Wt Readings from Last 3 Encounters:  04/18/17 146 lb 9.6 oz (66.5 kg)  12/13/16 161 lb 9.6 oz (73.3 kg)  09/08/16 162 lb 6.4 oz (73.7 kg)    Current Medications:  Current Outpatient Prescriptions on File Prior to Visit  Medication Sig Dispense Refill  . cholecalciferol (VITAMIN D) 1000 UNITS tablet Take 2,000 Units by mouth daily.    . Cyanocobalamin (VITAMIN B 12 PO) Take by mouth daily.    . cyclobenzaprine (FLEXERIL) 10 MG tablet TAKE as needed at night for headache/neck 30 tablet 0  . famotidine (PEPCID) 40 MG tablet Take 40 mg by mouth daily.    . fluticasone (FLONASE) 50 MCG/ACT nasal spray Place 2 sprays into both nostrils at bedtime. 16 g 1  . hydrochlorothiazide (HYDRODIURIL) 25 MG tablet Take 1 tablet (25 mg total) by mouth daily. 90 tablet 3  . levocetirizine (XYZAL) 5 MG tablet TAKE 1 TABLET BY MOUTH EVERY EVENING 90 tablet 3  . loratadine (CLARITIN) 10 MG tablet Take 10 mg by mouth daily.    . Magnesium 250 MG TABS Take 250 mg by mouth daily.    . metoprolol tartrate (LOPRESSOR) 50 MG tablet Take 1 tablet (50 mg total) by mouth 2 (two) times daily. 180 tablet 1  . simvastatin (ZOCOR) 40 MG  tablet Take 1 tablet (40 mg total) by mouth daily. 90 tablet 1   No current facility-administered medications on file prior to visit.    Medical History:  Past Medical History:  Diagnosis Date  . Allergy   . Endometriosis   . GERD (gastroesophageal reflux disease)   . Hyperlipidemia   . Hypertension   . Migraine   . Prediabetes   . Vitamin D deficiency    Allergies:  Allergies  Allergen Reactions  . Penicillins     rash     Review of Systems:  Review of Systems  Constitutional: Negative.   HENT: Negative for congestion (on claritin), ear discharge, ear pain, nosebleeds and sore throat.         + TMJ, wears night guard  Respiratory: Negative.  Negative for stridor.   Cardiovascular: Negative.   Gastrointestinal: Negative.   Genitourinary: Negative.   Musculoskeletal: Positive for myalgias (left foot). Negative for back pain, falls, joint pain and neck pain.  Skin: Negative.   Neurological: Negative for dizziness, sensory change, seizures and headaches.  Psychiatric/Behavioral: Negative.     Family history- Review and unchanged Social history- Review and unchanged Physical Exam: BP 126/80   Pulse 87   Resp 16   Ht 5' 4"  (1.626 m)   Wt 146 lb 9.6 oz (66.5 kg)   LMP 03/15/2015   SpO2 99%   BMI 25.16 kg/m  Wt Readings from Last 3 Encounters:  04/18/17 146 lb 9.6 oz (66.5 kg)  12/13/16 161 lb 9.6 oz (73.3 kg)  09/08/16 162 lb 6.4 oz (73.7 kg)   General Appearance: Well nourished, in no apparent distress. Eyes: PERRLA, EOMs, conjunctiva no swelling or erythema Sinuses: No Frontal/maxillary tenderness ENT/Mouth: Ext aud canals clear, TMs without erythema, bulging. No erythema, swelling, or exudate on post pharynx.  Tonsils not swollen or erythematous. Hearing normal.  Neck: Supple, thyroid normal.  Respiratory: Respiratory effort normal, BS equal bilaterally without rales, rhonchi, wheezing or stridor.  Cardio: RRR with no MRGs. Brisk peripheral pulses without edema.  Abdomen: Soft, + BS, nontender, no guarding, rebound, hernias, masses. Lymphatics: Non tender without lymphadenopathy.  Musculoskeletal: Full ROM, 5/5 strength, Normal gait Skin: left lateral foot with Warm, dry without rashes, lesions, ecchymosis.  Neuro: Cranial nerves intact. Normal muscle tone, no cerebellar symptoms. Psych: Awake and oriented X 3, normal affect, Insight and Judgment appropriate.    Vicie Mutters, PA-C 3:50 PM [facility]

## 2017-04-18 ENCOUNTER — Encounter: Payer: Self-pay | Admitting: Physician Assistant

## 2017-04-18 ENCOUNTER — Ambulatory Visit (INDEPENDENT_AMBULATORY_CARE_PROVIDER_SITE_OTHER): Payer: 59 | Admitting: Physician Assistant

## 2017-04-18 VITALS — BP 126/80 | HR 87 | Resp 16 | Ht 64.0 in | Wt 146.6 lb

## 2017-04-18 DIAGNOSIS — E785 Hyperlipidemia, unspecified: Secondary | ICD-10-CM

## 2017-04-18 DIAGNOSIS — G43809 Other migraine, not intractable, without status migrainosus: Secondary | ICD-10-CM

## 2017-04-18 DIAGNOSIS — R7303 Prediabetes: Secondary | ICD-10-CM

## 2017-04-18 DIAGNOSIS — Z79899 Other long term (current) drug therapy: Secondary | ICD-10-CM

## 2017-04-18 DIAGNOSIS — F17200 Nicotine dependence, unspecified, uncomplicated: Secondary | ICD-10-CM

## 2017-04-18 DIAGNOSIS — I10 Essential (primary) hypertension: Secondary | ICD-10-CM

## 2017-04-18 DIAGNOSIS — R350 Frequency of micturition: Secondary | ICD-10-CM | POA: Diagnosis not present

## 2017-04-18 DIAGNOSIS — B070 Plantar wart: Secondary | ICD-10-CM | POA: Diagnosis not present

## 2017-04-19 LAB — URINALYSIS, ROUTINE W REFLEX MICROSCOPIC
BILIRUBIN URINE: NEGATIVE
GLUCOSE, UA: NEGATIVE
Hgb urine dipstick: NEGATIVE
Ketones, ur: NEGATIVE
LEUKOCYTES UA: NEGATIVE
Nitrite: NEGATIVE
PH: 6.5 (ref 5.0–8.0)
Protein, ur: NEGATIVE
SPECIFIC GRAVITY, URINE: 1.01 (ref 1.001–1.03)

## 2017-04-19 LAB — TSH: TSH: 0.8 mIU/L

## 2017-04-19 LAB — HEPATIC FUNCTION PANEL
AG Ratio: 2 (calc) (ref 1.0–2.5)
ALBUMIN MSPROF: 4.5 g/dL (ref 3.6–5.1)
ALKALINE PHOSPHATASE (APISO): 60 U/L (ref 33–115)
ALT: 10 U/L (ref 6–29)
AST: 12 U/L (ref 10–30)
Bilirubin, Direct: 0.1 mg/dL (ref 0.0–0.2)
Globulin: 2.3 g/dL (calc) (ref 1.9–3.7)
Indirect Bilirubin: 0.1 mg/dL (calc) — ABNORMAL LOW (ref 0.2–1.2)
Total Bilirubin: 0.2 mg/dL (ref 0.2–1.2)
Total Protein: 6.8 g/dL (ref 6.1–8.1)

## 2017-04-19 LAB — CBC WITH DIFFERENTIAL/PLATELET
BASOS PCT: 0.5 %
Basophils Absolute: 53 cells/uL (ref 0–200)
Eosinophils Absolute: 137 cells/uL (ref 15–500)
Eosinophils Relative: 1.3 %
HEMATOCRIT: 39.4 % (ref 35.0–45.0)
Hemoglobin: 13.6 g/dL (ref 11.7–15.5)
LYMPHS ABS: 3896 {cells}/uL (ref 850–3900)
MCH: 32 pg (ref 27.0–33.0)
MCHC: 34.5 g/dL (ref 32.0–36.0)
MCV: 92.7 fL (ref 80.0–100.0)
MPV: 14.3 fL — AB (ref 7.5–12.5)
Monocytes Relative: 7.2 %
NEUTROS PCT: 53.9 %
Neutro Abs: 5660 cells/uL (ref 1500–7800)
Platelets: 142 10*3/uL (ref 140–400)
RBC: 4.25 10*6/uL (ref 3.80–5.10)
RDW: 12.4 % (ref 11.0–15.0)
Total Lymphocyte: 37.1 %
WBC: 10.5 10*3/uL (ref 3.8–10.8)
WBCMIX: 756 {cells}/uL (ref 200–950)

## 2017-04-19 LAB — LIPID PANEL
CHOLESTEROL: 118 mg/dL (ref <200)
HDL: 32 mg/dL — AB (ref >50)
LDL CHOLESTEROL (CALC): 66 mg/dL
Non-HDL Cholesterol (Calc): 86 mg/dL (calc) (ref <130)
TRIGLYCERIDES: 113 mg/dL (ref <150)
Total CHOL/HDL Ratio: 3.7 (calc) (ref <5.0)

## 2017-04-19 LAB — BASIC METABOLIC PANEL WITH GFR
BUN: 10 mg/dL (ref 7–25)
CO2: 32 mmol/L (ref 20–32)
CREATININE: 0.62 mg/dL (ref 0.50–1.10)
Calcium: 9.6 mg/dL (ref 8.6–10.2)
Chloride: 103 mmol/L (ref 98–110)
GFR, EST AFRICAN AMERICAN: 128 mL/min/{1.73_m2} (ref >=60)
GFR, EST NON AFRICAN AMERICAN: 110 mL/min/{1.73_m2} (ref >=60)
Glucose, Bld: 82 mg/dL (ref 65–99)
POTASSIUM: 3.5 mmol/L (ref 3.5–5.3)
Sodium: 142 mmol/L (ref 135–146)

## 2017-04-19 LAB — MAGNESIUM: Magnesium: 2.2 mg/dL (ref 1.5–2.5)

## 2017-04-20 LAB — URINE CULTURE
MICRO NUMBER: 81118479
SPECIMEN QUALITY: ADEQUATE

## 2017-05-02 ENCOUNTER — Ambulatory Visit (INDEPENDENT_AMBULATORY_CARE_PROVIDER_SITE_OTHER): Payer: 59 | Admitting: Physician Assistant

## 2017-05-02 ENCOUNTER — Encounter: Payer: Self-pay | Admitting: Physician Assistant

## 2017-05-02 VITALS — BP 116/80 | HR 65 | Temp 97.5°F | Resp 16 | Ht 64.0 in | Wt 145.3 lb

## 2017-05-02 DIAGNOSIS — F17200 Nicotine dependence, unspecified, uncomplicated: Secondary | ICD-10-CM

## 2017-05-02 DIAGNOSIS — B070 Plantar wart: Secondary | ICD-10-CM | POA: Diagnosis not present

## 2017-05-02 NOTE — Progress Notes (Signed)
Subjective:    Andrea Owens is a 41 y.o. female who complains of warts. The warts are located on left foot. They have been present for 3 months. The patient denies cellulitic infection symptoms but does have pain at the site.   The following portions of the patient's history were reviewed and updated as appropriate: allergies, current medications, past family history, past medical history, past social history, past surgical history and problem list.  Review of Systems Pertinent items are noted in HPI.    Objective:    Skin: Plantar wart noted on left foot. Size range is 4-5 cm.    Assessment:   Plantar wart Tobacco use disorder   Plan:     The viral etiology and natural history has been discussed.   Various treatment methods, side effects and failure rates have been discussed.   Area was numbed with lidocaine, 10 blade used to cut down to the base, minimal bleeding, tolerated well Discussed care and infection precautions Will start patches for tobacco cessation  Future Appointments Date Time Provider [place]  09/12/2017 2:00 PM Vicie Mutters, PA-C [facility] None

## 2017-05-02 NOTE — Patient Instructions (Signed)
Plantar Warts Warts are small growths on the skin. They can occur on various areas of the body. When they occur on the underside (sole) of the foot, they are called plantar warts. Plantar warts often occur in groups, with several small warts around a larger growth. They tend to develop over areas of pressure, such as the heel or the ball of the foot. Most warts are not painful, and they usually do not cause problems. However, plantar warts may cause pain when you walk because pressure is applied to them. Warts often go away on their own in time. Various treatments may be done if needed. Sometimes, warts go away and then they come back again. What are the causes? Plantar warts are caused by a type of virus that is called human papillomavirus (HPV). HPV attacks a break in the skin of the foot. Walking barefoot can lead to exposure to the virus. These warts may spread to other areas of the sole. They spread to other areas of the body only through direct contact. What increases the risk? Plantar warts are more likely to develop in:  People who are 15-60 years of age.  People who use public showers or locker rooms.  People who have a weakened body defense system (immune system).  What are the signs or symptoms? Plantar warts may be flat or slightly raised. They may grow into the deeper layers of skin or rise above the surface of the skin. Most plantar warts have a rough surface. They may cause pain when you use your foot to support your body weight. How is this diagnosed? A plantar wart can usually be diagnosed from its appearance. In some cases, a tissue sample may be removed (biopsy) to be looked at under a microscope. How is this treated? In many cases, warts do not need treatment. Without treatment, they often go away over a period of many months to a couple years. If treatment is needed, options may include:  Applying medicated solutions, creams, or patches to the wart. These may be  over-the-counter or prescription medicines that make the skin soft so that layers will gradually shed away. In many cases, the medicine is applied one or two times per day and covered with a bandage.  Putting duct tape over the top of the wart (occlusion). You will leave the tape in place for as long as told by your health care provider, then you will replace it with a new strip of tape. This is done until the wart goes away.  Freezing the wart with liquid nitrogen (cryotherapy).  Burning the wart with: ? Laser treatment. ? An electrified probe (electrocautery).  Injection of a medicine (Candida antigen) into the wart to help the body's immune system to fight off the wart.  Surgery to remove the wart.  Follow these instructions at home:  Apply medicated creams or solutions only as told by your health care provider. This may involve: ? Soaking the affected area in warm water. ? Removing the top layer of softened skin before you apply the medicine. A pumice stone works well for removing the tissue. ? Applying a bandage over the affected area after you apply the medicine. ? Repeating the process daily or as told by your health care provider.  Do not scratch or pick at a wart.  Wash your hands after you touch a wart.  If a wart is painful, try applying a bandage with a hole in the middle over the wart. The helps to take  pressure off the wart.  Keep all follow-up visits as told by your health care provider. This is important. How is this prevented? Take these actions to help prevent warts:  Wear shoes and socks. Change your socks daily.  Keep your feet clean and dry.  Check your feet regularly.  Avoid direct contact with warts on other people.  Contact a health care provider if:  Your warts do not improve after treatment.  You have redness, swelling, or pain at the site of a wart.  You have bleeding from a wart that does not stop with light pressure.  You have diabetes and  you develop a wart. This information is not intended to replace advice given to you by your health care provider. Make sure you discuss any questions you have with your health care provider. Document Released: 09/18/2003 Document Revised: 12/04/2015 Document Reviewed: 09/23/2014 Elsevier Interactive Patient Education  [employer].  If you have a smart phone, please look up [employer], this will help you stay on track and give you information about money you have saved, life that you have gained back and a ton of more information.   We are giving you chantix for smoking cessation. You can do it! And we are here to help! You may have heard some scary side effects about chantix, the three most common I hear about are nausea, crazy dreams and depression.  However, I like for my patients to try to stay on 1/2 a tablet twice a day rather than one tablet twice a day as normally prescribed. This helps decrease the chances of side effects and helps save money by making a one month prescription last two months  Please start the prescription this way:  Start 1/2 tablet by mouth once daily after food with a full glass of water for 3 days Then do 1/2 tablet by mouth twice daily for 4 days. During this first week you can smoke, but try to stop after this week.  At this point we have several options: 1) continue on 1/2 tablet twice a day- which I encourage you to do. You can stay on this dose the rest of the time on the medication or if you still feel the need to smoke you can do one of the two options below. 2) do one tablet in the morning and 1/2 in the evening which helps decrease dreams. 3) do one tablet twice a day.   What if I miss a dose? If you miss a dose, take it as soon as you can. If it is almost time for your next dose, take only that dose. Do not take double or extra doses.  What should I watch for while using this medicine? Visit your doctor or health care professional for regular  check ups. Ask for ongoing advice and encouragement from your doctor or healthcare professional, friends, and family to help you quit. If you smoke while on this medication, quit again  Your mouth may get dry. Chewing sugarless gum or hard candy, and drinking plenty of water may help. Contact your doctor if the problem does not go away or is severe.  You may get drowsy or dizzy. Do not drive, use machinery, or do anything that needs mental alertness until you know how this medicine affects you. Do not stand or sit up quickly, especially if you are an older patient.   The use of this medicine may increase the chance of suicidal thoughts or actions. Pay special  attention to how you are responding while on this medicine. Any worsening of mood, or thoughts of suicide or dying should be reported to your health care professional right away.  ADVANTAGES OF QUITTING SMOKING  Within 20 minutes, blood pressure decreases. Your pulse is at normal level.  After 8 hours, carbon monoxide levels in the blood return to normal. Your oxygen level increases.  After 24 hours, the chance of having a heart attack starts to decrease. Your breath, hair, and body stop smelling like smoke.  After 48 hours, damaged nerve endings begin to recover. Your sense of taste and smell improve.  After 72 hours, the body is virtually free of nicotine. Your bronchial tubes relax and breathing becomes easier.  After 2 to 12 weeks, lungs can hold more air. Exercise becomes easier and circulation improves.  After 1 year, the risk of coronary heart disease is cut in half.  After 5 years, the risk of stroke falls to the same as a nonsmoker.  After 10 years, the risk of lung cancer is cut in half and the risk of other cancers decreases significantly.  After 15 years, the risk of coronary heart disease drops, usually to the level of a nonsmoker.  You will have extra money to spend on things other than cigarettes.

## 2017-06-06 ENCOUNTER — Encounter (HOSPITAL_COMMUNITY): Payer: Self-pay

## 2017-06-06 ENCOUNTER — Emergency Department (HOSPITAL_COMMUNITY)
Admission: EM | Admit: 2017-06-06 | Discharge: 2017-06-06 | Disposition: A | Discharge status: Home / Self Care | Payer: 59 | Attending: Emergency Medicine | Admitting: Emergency Medicine

## 2017-06-06 ENCOUNTER — Other Ambulatory Visit: Payer: Self-pay

## 2017-06-06 ENCOUNTER — Emergency Department (HOSPITAL_COMMUNITY): Payer: 59

## 2017-06-06 DIAGNOSIS — F17200 Nicotine dependence, unspecified, uncomplicated: Secondary | ICD-10-CM | POA: Insufficient documentation

## 2017-06-06 DIAGNOSIS — M25561 Pain in right knee: Secondary | ICD-10-CM | POA: Diagnosis not present

## 2017-06-06 DIAGNOSIS — I10 Essential (primary) hypertension: Secondary | ICD-10-CM | POA: Diagnosis not present

## 2017-06-06 DIAGNOSIS — M25571 Pain in right ankle and joints of right foot: Secondary | ICD-10-CM | POA: Insufficient documentation

## 2017-06-06 DIAGNOSIS — Z79899 Other long term (current) drug therapy: Secondary | ICD-10-CM | POA: Diagnosis not present

## 2017-06-06 MED ORDER — OXYCODONE-ACETAMINOPHEN 5-325 MG PO TABS
1.0000 | ORAL_TABLET | Freq: Once | ORAL | Status: AC
  Administered 2017-06-06: 1 via ORAL
  Filled 2017-06-06: qty 1

## 2017-06-06 MED ORDER — NAPROXEN 500 MG PO TABS: 500.0000 mg | ORAL_TABLET | Freq: Two times a day (BID) | ORAL | 0 refills | Status: DC

## 2017-06-06 NOTE — ED Triage Notes (Signed)
Pt states that her dog tripped her and she fell onto R knee, pain shoots all the way down her leg. No swelling noted

## 2017-06-06 NOTE — ED Provider Notes (Signed)
[facility] Provider Note   CSN: [id number] Arrival date & time: 06/06/17  4098     History   Chief Complaint Chief Complaint  Patient presents with  . Knee Pain    HPI Andrea Owens is a 41 y.o. female who presents to ED for evaluation of right knee and ankle pain that occurred after an injury prior to arrival.  She was standing still when her dog accidentally ran into her and caused her to twist her ankle and knee.  She reports pain shooting from her knee to her ankle.  She has tried Tylenol with no relief in her symptoms.  She reports pain worse with weightbearing.  She denies any previous fracture, dislocations or procedures in the area.  She denies any fevers, red hot or tender joints, numbness in legs, head injury or loss of consciousness.  HPI  Past Medical History:  Diagnosis Date  . Allergy   . Endometriosis   . GERD (gastroesophageal reflux disease)   . Hyperlipidemia   . Hypertension   . Migraine   . Prediabetes   . Vitamin D deficiency     Patient Active Problem List   Diagnosis Date Noted  . Tobacco use disorder 09/08/2015  . Lower back pain 09/04/2014  . Endometriosis   . Migraine   . Gallbladder polyp 09/11/2013  . Hypertension   . Hyperlipidemia   . GERD (gastroesophageal reflux disease)   . Vitamin D deficiency   . Prediabetes     Past Surgical History:  Procedure Laterality Date  . ABDOMINAL HYSTERECTOMY  03/2015   Dr. Corinna Capra  . REFRACTIVE SURGERY  2004    OB History    No data available       Home Medications    Prior to Admission medications   Medication Sig Start Date End Date Taking? Authorizing Provider  cholecalciferol (VITAMIN D) 1000 UNITS tablet Take 2,000 Units by mouth daily.    [provider]  Cyanocobalamin (VITAMIN B 12 PO) Take by mouth daily.    [provider]  cyclobenzaprine (FLEXERIL) 10 MG tablet TAKE as needed at night for headache/neck 03/17/16   Vicie Mutters, PA-C  famotidine (PEPCID) 40 MG tablet Take 40 mg by mouth daily.    [provider]  fluticasone (FLONASE) 50 MCG/ACT nasal spray Place 2 sprays into both nostrils at bedtime. 08/22/14   Vicie Mutters, PA-C  hydrochlorothiazide (HYDRODIURIL) 25 MG tablet Take 1 tablet (25 mg total) by mouth daily. 12/13/16   Vicie Mutters, PA-C  levocetirizine (XYZAL) 5 MG tablet TAKE 1 TABLET BY MOUTH EVERY EVENING 12/06/16   Unk Pinto, MD  loratadine (CLARITIN) 10 MG tablet Take 10 mg by mouth daily.    [provider]  Magnesium 250 MG TABS Take 250 mg by mouth daily.    [provider]  metoprolol tartrate (LOPRESSOR) 50 MG tablet Take 1 tablet (50 mg total) by mouth 2 (two) times daily. 12/13/16   Vicie Mutters, PA-C  naproxen (NAPROSYN) 500 MG tablet Take 1 tablet (500 mg total) by mouth 2 (two) times daily. 06/06/17   Jaquana Geiger, PA-C  simvastatin (ZOCOR) 40 MG tablet Take 1 tablet (40 mg total) by mouth daily. 12/13/16   Vicie Mutters, PA-C    Family History Family History  Problem Relation Age of Onset  . Hyperlipidemia Mother   . Irritable bowel syndrome Mother   . Heart disease Father   . Hyperlipidemia Father   . Hypertension Father   .  Breast cancer Maternal Aunt        [employer]  . Prostate cancer Maternal Grandfather   . Colon cancer Neg Hx   . Esophageal cancer Neg Hx   . Stomach cancer Neg Hx   . Rectal cancer Neg Hx     Social History Social History   Tobacco Use  . Smoking status: Current Every Day Smoker    Packs/day: 0.50    Years: 20.00    Pack years: 10.00  . Smokeless tobacco: Never Used  Substance Use Topics  . Alcohol use: No  . Drug use: No     Allergies   Penicillins   Review of Systems Review of Systems  Constitutional: Negative for chills and fever.  Gastrointestinal: Negative for nausea and vomiting.  Musculoskeletal: Positive for arthralgias, gait problem and joint swelling. Negative for back pain,  myalgias, neck pain and neck stiffness.  Skin: Negative for color change, rash and wound.  Neurological: Negative for weakness and numbness.     Physical Exam Updated Vital Signs BP (!) 141/84   Pulse 62   Temp 98.7 F (37.1 C) (Oral)   Resp 20   Ht 5' 4"  (1.626 m)   Wt 65.8 kg (145 lb)   LMP 03/15/2015   SpO2 100%   BMI 24.89 kg/m   Physical Exam  Constitutional: She appears well-developed and well-nourished. No distress.  HENT:  Head: Normocephalic and atraumatic.  Eyes: Conjunctivae and EOM are normal. No scleral icterus.  Neck: Normal range of motion.  Pulmonary/Chest: Effort normal. No respiratory distress.  Musculoskeletal: Normal range of motion. She exhibits edema and tenderness. She exhibits no deformity.  Tenderness to palpation around the entire right knee and the lateral aspect of the right ankle.  Full active and passive range of motion of knee and ankle with some edema noted around the right ankle.  No color or temperature change noted in knee or ankle.  Sensation intact to light touch with 2+ DP pulses noted bilaterally.  Able to perform straight leg raise.  Neurological: She is alert.  Skin: No rash noted. She is not diaphoretic.  Psychiatric: She has a normal mood and affect.  Nursing note and vitals reviewed.    ED Treatments / Results  Labs (all labs ordered are listed, but only abnormal results are displayed) Labs Reviewed - No data to display  EKG  EKG Interpretation None       Radiology Dg Knee Complete 4 Views Right  Result Date: 06/06/2017 CLINICAL DATA:  Initial evaluation for acute injury, twisted leg. Fall. EXAM: RIGHT KNEE - COMPLETE 4+ VIEW COMPARISON:  None. FINDINGS: No evidence of fracture, dislocation, or joint effusion. No evidence of arthropathy or other focal bone abnormality. Soft tissues are unremarkable. IMPRESSION: No acute osseous abnormality about the knee. Electronically Signed   By: Jeannine Boga M.D.   On:  06/06/2017 21:14    Procedures Procedures (including critical care time)  Medications Ordered in ED Medications  oxyCODONE-acetaminophen (PERCOCET/ROXICET) 5-325 MG per tablet 1 tablet (1 tablet Oral Given 06/06/17 2213)     Initial Impression / Assessment and Plan / ED Course  I have reviewed the triage vital signs and the nursing notes.  Pertinent labs & imaging results that were available during my care of the patient were reviewed by me and considered in my medical decision making (see chart for details).  Clinical Course as of Jun 07 2215  Mon Jun 06, 2017  2038 Patient in x-ray when I  arrived for my initial evaluation.  [HK]    Clinical Course User Index [HK] Delia Heady, PA-C    Patient presents to ED for evaluation of right knee and ankle pain after injury prior to arrival.  She was standing still when her dog accidentally hit her and caused her to twist her knee and ankle.  She has been ambulatory with pain since the incident.  She denies any previous fracture, dislocation or procedures in the area.  On physical exam there is mild edema noted to the lateral aspect of the right ankle but otherwise full active and passive range of motion of knee and ankle.  There is no color or temperature change of joint that would concern me for septic joint.  She is overall well-appearing.  X-ray of the knee return as negative for acute abnormality.  I did order an x-ray of the ankle but patient became very angry stating that I have been waiting for so long and nothing is being done."  I did encourage her to stay at least to get imaging done or to have supportive treatment with knee sleeve and ankle brace.  She declines at this time and states that she would like to leave before the x-ray or any braces.  Did give her follow-up information for orthopedist.  Patient appears stable for discharge at this time.  Strict return precautions given.  Final Clinical Impressions(s) / ED Diagnoses   Final  diagnoses:  Acute pain of right knee    ED Discharge Orders        Ordered    naproxen (NAPROSYN) 500 MG tablet  2 times daily     06/06/17 2212       Delia Heady, PA-C 06/06/17 2217    Little, Wenda Overland, MD 06/06/17 [phone]

## 2017-06-06 NOTE — Discharge Instructions (Signed)
Please read the attached information regarding your condition. Take naproxen as needed for pain.  Wear knee sleeve and ankle brace as directed. Follow-up at orthopedist listed below for further evaluation. Return to ED for worsening pain, additional injuries, numbness in legs, red hot or swollen joint with fevers.

## 2017-06-06 NOTE — ED Notes (Signed)
Patient and spouse upset due to long wait , nurse explained delay process and high census , she refused further x-ray and will not wait for ortho tech to apply knee sleeve /ASO splint .

## 2017-06-06 NOTE — Progress Notes (Signed)
Orthopedic Tech Progress Note Patient Details:  Andrea Owens Nov 29, 1975 [id number]  Ortho Devices Type of Ortho Device: ASO, Knee Sleeve Ortho Device/Splint Location: rle knee sleeve and aso Ortho Device/Splint Interventions: Ordered, Application, Adjustment   Andrea Owens 06/06/2017, 10:40 PM

## 2017-06-24 ENCOUNTER — Other Ambulatory Visit: Payer: Self-pay | Admitting: Obstetrics and Gynecology

## 2017-06-24 DIAGNOSIS — R928 Other abnormal and inconclusive findings on diagnostic imaging of breast: Secondary | ICD-10-CM

## 2017-07-01 ENCOUNTER — Ambulatory Visit
Admission: RE | Admit: 2017-07-01 | Discharge: 2017-07-01 | Disposition: A | Discharge status: Home / Self Care | Payer: 59 | Source: Ambulatory Visit | Attending: Obstetrics and Gynecology | Admitting: Obstetrics and Gynecology

## 2017-07-01 ENCOUNTER — Ambulatory Visit: Payer: 59

## 2017-07-01 DIAGNOSIS — R928 Other abnormal and inconclusive findings on diagnostic imaging of breast: Secondary | ICD-10-CM

## 2017-08-04 ENCOUNTER — Other Ambulatory Visit: Payer: Self-pay | Admitting: Physician Assistant

## 2017-08-04 DIAGNOSIS — Z0001 Encounter for general adult medical examination with abnormal findings: Secondary | ICD-10-CM

## 2017-08-04 DIAGNOSIS — E785 Hyperlipidemia, unspecified: Secondary | ICD-10-CM

## 2017-09-08 NOTE — Progress Notes (Signed)
Complete Physical  Assessment and Plan: Essential hypertension - continue medications, DASH diet, exercise and monitor at home. Call if greater than 130/80.  - Urinalysis, Routine w reflex microscopic (not at [facility]) - Microalbumin / creatinine urine ratio  Hyperlipidemia -continue medications, check lipids, decrease fatty foods, increase activity.  - simvastatin (ZOCOR) 40 MG tablet; Take 1 tablet (40 mg total) by mouth daily.  Dispense: 90 tablet; Refill: 1  Vitamin D deficiency - VITAMIN D 25 Hydroxy (Vit-D Deficiency, Fractures)  Gastroesophageal reflux disease with esophagitis Diet controlled  Gallbladder polyp Low fat diet, monitor   Other migraine without status migrainosus, not intractable Better s/p hysterectomy   Medication management - Magnesium   Encounter for general adult medical examination with abnormal findings   Tobacco use disorder Smoking cessation instruction/counseling given:  counseled patient on the dangers of tobacco use, advised patient to stop smoking, and reviewed strategies to maximize success  Discussed med's effects and SE's. Screening labs and tests as requested with regular follow-up as recommended.  HPI Patient presents for complete physical.   Patient's blood pressure has been controlled at home, she is on 19m metoprolol BID and HCTZ 242mBID, still elevated. She continues to smoke. Patient denies chest pain, shortness of breath, dizziness. BP: 112/70.  Lab Results  Component Value Date   CREATININE 0.62 04/18/2017   BUN 10 04/18/2017   NA 142 04/18/2017   K 3.5 04/18/2017   CL 103 04/18/2017   CO2 32 04/18/2017   She continue to have tingling bilateral hands and feet, worse left thumb. Denies changes, in vision, weakness. She had normal TSH 03/2016, normal iron, ferritin, urine, CBC, HIV, ANA, antiDNA, CPK. She has had normal MRI.  Patient's cholesterol is diet controlled. She is on zocor and denies myalgias. The patient's cholesterol  last visit was Lab Results  Component Value Date   CHOL 118 04/18/2017   HDL 32 (L) 04/18/2017   LDLCALC 57 12/13/2016   TRIG 113 04/18/2017   CHOLHDL 3.7 04/18/2017   The patient has been working on diet and exercise for prediabetes, denies changes in vision, polys, and paresthesias. Last A1C in office  Lab Results  Component Value Date   HGBA1C 5.2 03/17/2016   Thrombocytopenia history Lab Results  Component Value Date   WBC 10.5 04/18/2017   HGB 13.6 04/18/2017   HCT 39.4 04/18/2017   MCV 92.7 04/18/2017   PLT 142 04/18/2017   She had EGD and was treated for H pylori by Dr. PyHilarie Fredricksonn 2015.  She is on vitamin D Lab Results  Component Value Date   VD25OH 79 03/17/2016   BMI is Body mass index is 24.41 kg/m., she is working on diet and exercise. Wt Readings from Last 3 Encounters:  09/12/17 142 lb 3.2 oz (64.5 kg)  06/06/17 145 lb (65.8 kg)  05/02/17 145 lb 4.8 oz (65.9 kg)   Current Medications:  Current Outpatient Medications on File Prior to Visit  Medication Sig Dispense Refill  . cholecalciferol (VITAMIN D) 1000 UNITS tablet Take 2,000 Units by mouth daily.    . Cyanocobalamin (VITAMIN B 12 PO) Take by mouth daily.    . cyclobenzaprine (FLEXERIL) 10 MG tablet TAKE as needed at night for headache/neck 30 tablet 0  . famotidine (PEPCID) 40 MG tablet Take 40 mg by mouth daily.    . fluticasone (FLONASE) 50 MCG/ACT nasal spray Place 2 sprays into both nostrils at bedtime. 16 g 1  . hydrochlorothiazide (HYDRODIURIL) 25 MG tablet Take 1  tablet (25 mg total) by mouth daily. 90 tablet 3  . levocetirizine (XYZAL) 5 MG tablet TAKE 1 TABLET BY MOUTH EVERY EVENING 90 tablet 3  . loratadine (CLARITIN) 10 MG tablet Take 10 mg by mouth daily.    . Magnesium 250 MG TABS Take 250 mg by mouth daily.    . metoprolol tartrate (LOPRESSOR) 50 MG tablet TAKE 1 TABLET BY MOUTH TWO  TIMES DAILY 180 tablet 1  . naproxen (NAPROSYN) 500 MG tablet Take 1 tablet (500 mg total) by mouth 2  (two) times daily. 30 tablet 0  . simvastatin (ZOCOR) 40 MG tablet TAKE 1 TABLET BY MOUTH  DAILY 90 tablet 1   No current facility-administered medications on file prior to visit.    Health Maintenance: Immunization History  Administered Date(s) Administered  . Tdap 07/17/2012   Tetanus: 07/2012 Pneumovax: N/A Prevnar 13 N/A Flu vaccine: declines  Zostavax: N/A Patient's last menstrual period was 03/15/2015. s/p hysterectomy Pap: 2016 Dr. Corinna Capra s/p hysterectomy in 2016 3D MGM:  06/2017 category C DEXA: N/A  Colonoscopy:N/A  EGD: 09/2013 Lumbar Xray 2013 CXR 2009 MRI brain 09/2016 normal  Patient Care Team: Unk Pinto, MD as PCP - General (Internal Medicine) Pyrtle, Lajuan Lines, MD as Consulting Physician (Gastroenterology) Louretta Shorten, MD as Consulting Physician (Obstetrics and Gynecology)  Medical History:  Past Medical History:  Diagnosis Date  . Allergy   . Endometriosis   . GERD (gastroesophageal reflux disease)   . Hyperlipidemia   . Hypertension   . Migraine   . Prediabetes   . Vitamin D deficiency    Allergies Allergies  Allergen Reactions  . Penicillins     rash    SURGICAL HISTORY She  has a past surgical history that includes Refractive surgery (2004) and Abdominal hysterectomy (03/2015). FAMILY HISTORY Her family history includes Breast cancer in her maternal aunt; Heart disease in her father; Hyperlipidemia in her father and mother; Hypertension in her father; Irritable bowel syndrome in her mother; Prostate cancer in her maternal grandfather. SOCIAL HISTORY She  reports that she has been smoking.  She has a 10.00 pack-year smoking history. she has never used smokeless tobacco. She reports that she does not drink alcohol or use drugs.   ROS Review of Systems  Constitutional: Negative for chills, diaphoresis, fever, malaise/fatigue and weight loss.  HENT: Negative for congestion, ear discharge, ear pain, hearing loss, nosebleeds, sinus pain, sore  throat and tinnitus.        + TMJ, wears night guard  Eyes: Negative.  Negative for blurred vision and double vision.  Respiratory: Negative.  Negative for cough, hemoptysis, sputum production, shortness of breath, wheezing and stridor.   Cardiovascular: Negative for chest pain, palpitations, orthopnea, claudication, leg swelling and PND.  Gastrointestinal: Negative.   Genitourinary: Negative.  Negative for frequency.  Musculoskeletal: Positive for back pain. Negative for falls, joint pain, myalgias and neck pain.  Skin: Negative.  Negative for itching and rash.  Neurological: Positive for tingling (bilateral hands and feet, actual numbness left thumb). Negative for dizziness, tremors, sensory change, speech change, focal weakness, seizures, loss of consciousness, weakness and headaches.  Endo/Heme/Allergies: Negative.   Psychiatric/Behavioral: Negative for depression, hallucinations, memory loss, substance abuse and suicidal ideas. The patient is nervous/anxious. The patient does not have insomnia.    Physical Exam: Estimated body mass index is 24.41 kg/m as calculated from the following:   Height as of this encounter: 5' 4"  (1.626 m).   Weight as of this encounter: 142 lb  3.2 oz (64.5 kg). Vitals:   09/12/17 1407  BP: 112/70  Pulse: 77  Temp: 97.9 F (36.6 C)  SpO2: 96%   General Appearance: Well nourished, in no apparent distress. Eyes: PERRLA, EOMs, conjunctiva no swelling or erythema, normal fundi and vessels. Sinuses: No Frontal/maxillary tenderness ENT/Mouth: Ext aud canals clear, normal light reflex with TMs without erythema, bulging.  Good dentition. No erythema, swelling, or exudate on post pharynx. Tonsils not swollen or erythematous. Hearing normal.  Neck: Supple, thyroid normal. No bruits Respiratory: Respiratory effort normal, BS equal bilaterally without rales, rhonchi, wheezing or stridor. Cardio: RRR without murmurs, rubs or gallops. Brisk peripheral pulses without  edema.  Chest: symmetric, with normal excursions and percussion. Breasts: defer OB/GYN Abdomen: Soft, +BS, nontender no guarding, rebound, hernias, masses, or organomegaly. .  Lymphatics: Non tender without lymphadenopathy.  Genitourinary: defer Musculoskeletal: Full ROM all peripheral extremities,5/5 strength, and normal gait. Skin: Warm, dry without rashes, lesions, ecchymosis.  Neuro: Cranial nerves intact, reflexes equal bilaterally. Normal muscle tone, no cerebellar symptoms. Sensation intact.  Psych: Awake and oriented X 3, normal affect, Insight and Judgment appropriate.   EKG: defer had prior to surgery    Vicie Mutters 2:21 PM

## 2017-09-12 ENCOUNTER — Ambulatory Visit (INDEPENDENT_AMBULATORY_CARE_PROVIDER_SITE_OTHER): Payer: BLUE CROSS/BLUE SHIELD | Admitting: Physician Assistant

## 2017-09-12 ENCOUNTER — Encounter: Payer: Self-pay | Admitting: Physician Assistant

## 2017-09-12 VITALS — BP 112/70 | HR 77 | Temp 97.9°F | Ht 64.0 in | Wt 142.2 lb

## 2017-09-12 DIAGNOSIS — I10 Essential (primary) hypertension: Secondary | ICD-10-CM

## 2017-09-12 DIAGNOSIS — F17200 Nicotine dependence, unspecified, uncomplicated: Secondary | ICD-10-CM

## 2017-09-12 DIAGNOSIS — G43809 Other migraine, not intractable, without status migrainosus: Secondary | ICD-10-CM

## 2017-09-12 DIAGNOSIS — E559 Vitamin D deficiency, unspecified: Secondary | ICD-10-CM

## 2017-09-12 DIAGNOSIS — K2100 Gastro-esophageal reflux disease with esophagitis, without bleeding: Secondary | ICD-10-CM

## 2017-09-12 DIAGNOSIS — E785 Hyperlipidemia, unspecified: Secondary | ICD-10-CM

## 2017-09-12 DIAGNOSIS — Z0001 Encounter for general adult medical examination with abnormal findings: Secondary | ICD-10-CM

## 2017-09-12 DIAGNOSIS — Z0000 Encounter for general adult medical examination without abnormal findings: Secondary | ICD-10-CM

## 2017-09-12 DIAGNOSIS — K824 Cholesterolosis of gallbladder: Secondary | ICD-10-CM

## 2017-09-12 NOTE — Patient Instructions (Addendum)
Try carpal tunnel splint at night for 6-8 weeks.  If not better will refer to ortho  Get OTC plantar wart pad for your feet, if it is not better we will refer you to podiatry.    Carpal Tunnel Syndrome Carpal tunnel syndrome is a condition that causes pain in your hand and arm. The carpal tunnel is a narrow area located on the palm side of your wrist. Repeated wrist motion or certain diseases may cause swelling within the tunnel. This swelling pinches the main nerve in the wrist (median nerve). What are the causes? This condition may be caused by:  Repeated wrist motions.  Wrist injuries.  Arthritis.  A cyst or tumor in the carpal tunnel.  Fluid buildup during pregnancy.  Sometimes the cause of this condition is not known. What increases the risk? This condition is more likely to develop in:  People who have jobs that cause them to repeatedly move their wrists in the same motion, such as Art gallery manager.  Women.  People with certain conditions, such as: ? Diabetes. ? Obesity. ? An underactive thyroid (hypothyroidism). ? Kidney failure.  What are the signs or symptoms? Symptoms of this condition include:  A tingling feeling in your fingers, especially in your thumb, index, and middle fingers.  Tingling or numbness in your hand.  An aching feeling in your entire arm, especially when your wrist and elbow are bent for long periods of time.  Wrist pain that goes up your arm to your shoulder.  Pain that goes down into your palm or fingers.  A weak feeling in your hands. You may have trouble grabbing and holding items.  Your symptoms may feel worse during the night. How is this diagnosed? This condition is diagnosed with a medical history and physical exam. You may also have tests, including:  An electromyogram (EMG). This test measures electrical signals sent by your nerves into the muscles.  X-rays.  How is this treated? Treatment for this condition  includes:  Lifestyle changes. It is important to stop doing or modify the activity that caused your condition.  Physical or occupational therapy.  Medicines for pain and inflammation. This may include medicine that is injected into your wrist.  A wrist splint.  Surgery.  Follow these instructions at home: If you have a splint:  Wear it as told by your health care provider. Remove it only as told by your health care provider.  Loosen the splint if your fingers become numb and tingle, or if they turn cold and blue.  Keep the splint clean and dry. General instructions  Take over-the-counter and prescription medicines only as told by your health care provider.  Rest your wrist from any activity that may be causing your pain. If your condition is work related, talk to your employer about changes that can be made, such as getting a wrist pad to use while typing.  If directed, apply ice to the painful area: ? Put ice in a plastic bag. ? Place a towel between your skin and the bag. ? Leave the ice on for 20 minutes, 2-3 times per day.  Keep all follow-up visits as told by your health care provider. This is important.  Do any exercises as told by your health care provider, physical therapist, or occupational therapist. Contact a health care provider if:  You have new symptoms.  Your pain is not controlled with medicines.  Your symptoms get worse. This information is not intended to replace advice given  to you by your health care provider. Make sure you discuss any questions you have with your health care provider. Document Released: 06/25/2000 Document Revised: 11/06/2015 Document Reviewed: 11/13/2014 Elsevier Interactive Patient Education  [employer].

## 2017-09-13 LAB — BASIC METABOLIC PANEL WITH GFR
BUN: 14 mg/dL (ref 7–25)
CALCIUM: 9.9 mg/dL (ref 8.6–10.2)
CHLORIDE: 105 mmol/L (ref 98–110)
CO2: 34 mmol/L — AB (ref 20–32)
CREATININE: 0.73 mg/dL (ref 0.50–1.10)
GFR, Est African American: 117 mL/min/{1.73_m2} (ref >=60)
GFR, Est Non African American: 101 mL/min/{1.73_m2} (ref >=60)
GLUCOSE: 84 mg/dL (ref 65–99)
Potassium: 4.3 mmol/L (ref 3.5–5.3)
Sodium: 146 mmol/L (ref 135–146)

## 2017-09-13 LAB — MICROALBUMIN / CREATININE URINE RATIO
Creatinine, Urine: 114 mg/dL (ref 20–275)
MICROALB UR: 0.7 mg/dL
Microalb Creat Ratio: 6 mcg/mg creat (ref <30)

## 2017-09-13 LAB — CBC WITH DIFFERENTIAL/PLATELET
Basophils Absolute: 38 cells/uL (ref 0–200)
Basophils Relative: 0.4 %
EOS ABS: 105 {cells}/uL (ref 15–500)
Eosinophils Relative: 1.1 %
HEMATOCRIT: 38.1 % (ref 35.0–45.0)
Hemoglobin: 13.4 g/dL (ref 11.7–15.5)
LYMPHS ABS: 3240 {cells}/uL (ref 850–3900)
MCH: 33.3 pg — AB (ref 27.0–33.0)
MCHC: 35.2 g/dL (ref 32.0–36.0)
MCV: 94.5 fL (ref 80.0–100.0)
MPV: 14.1 fL — ABNORMAL HIGH (ref 7.5–12.5)
Monocytes Relative: 6.9 %
NEUTROS PCT: 57.5 %
Neutro Abs: 5463 cells/uL (ref 1500–7800)
Platelets: 150 10*3/uL (ref 140–400)
RBC: 4.03 10*6/uL (ref 3.80–5.10)
RDW: 11.7 % (ref 11.0–15.0)
TOTAL LYMPHOCYTE: 34.1 %
WBC: 9.5 10*3/uL (ref 3.8–10.8)
WBCMIX: 656 {cells}/uL (ref 200–950)

## 2017-09-13 LAB — URINALYSIS, ROUTINE W REFLEX MICROSCOPIC
Bilirubin Urine: NEGATIVE
GLUCOSE, UA: NEGATIVE
Hgb urine dipstick: NEGATIVE
KETONES UR: NEGATIVE
LEUKOCYTES UA: NEGATIVE
NITRITE: NEGATIVE
PH: 5.5 (ref 5.0–8.0)
Protein, ur: NEGATIVE
SPECIFIC GRAVITY, URINE: 1.022 (ref 1.001–1.03)

## 2017-09-13 LAB — LIPID PANEL
CHOLESTEROL: 123 mg/dL (ref <200)
HDL: 33 mg/dL — AB (ref >50)
LDL CHOLESTEROL (CALC): 66 mg/dL
Non-HDL Cholesterol (Calc): 90 mg/dL (calc) (ref <130)
Total CHOL/HDL Ratio: 3.7 (calc) (ref <5.0)
Triglycerides: 165 mg/dL — ABNORMAL HIGH (ref <150)

## 2017-09-13 LAB — HEPATIC FUNCTION PANEL
AG RATIO: 2 (calc) (ref 1.0–2.5)
ALT: 10 U/L (ref 6–29)
AST: 12 U/L (ref 10–30)
Albumin: 4.4 g/dL (ref 3.6–5.1)
Alkaline phosphatase (APISO): 53 U/L (ref 33–115)
BILIRUBIN INDIRECT: 0.4 mg/dL (ref 0.2–1.2)
Bilirubin, Direct: 0.1 mg/dL (ref 0.0–0.2)
GLOBULIN: 2.2 g/dL (ref 1.9–3.7)
TOTAL PROTEIN: 6.6 g/dL (ref 6.1–8.1)
Total Bilirubin: 0.5 mg/dL (ref 0.2–1.2)

## 2017-09-13 LAB — TSH: TSH: 0.51 mIU/L

## 2017-11-01 ENCOUNTER — Other Ambulatory Visit: Payer: Self-pay | Admitting: Physician Assistant

## 2017-11-01 DIAGNOSIS — Z0001 Encounter for general adult medical examination with abnormal findings: Secondary | ICD-10-CM

## 2017-11-01 DIAGNOSIS — I10 Essential (primary) hypertension: Secondary | ICD-10-CM

## 2017-11-01 DIAGNOSIS — Z9109 Other allergy status, other than to drugs and biological substances: Secondary | ICD-10-CM

## 2018-01-08 ENCOUNTER — Other Ambulatory Visit: Payer: Self-pay | Admitting: Internal Medicine

## 2018-01-08 DIAGNOSIS — E785 Hyperlipidemia, unspecified: Secondary | ICD-10-CM

## 2018-01-08 DIAGNOSIS — Z0001 Encounter for general adult medical examination with abnormal findings: Secondary | ICD-10-CM

## 2018-03-16 NOTE — Progress Notes (Signed)
Assessment and Plan:   Hypertension -Continue medication, monitor blood pressure at home. Continue DASH diet.  Reminder to go to the ER if any CP, SOB, nausea, dizziness, severe HA, changes vision/speech, left arm numbness and tingling and jaw pain.  Cholesterol -Continue diet and exercise. Check cholesterol.    Vitamin D Def - check level and continue medications.   Tobacco use Will send in patches  Continue diet and meds as discussed. Further disposition pending results of labs. Over 30 minutes of exam, counseling, chart review, and critical decision making was performed  Future Appointments  Date Time Provider [place]  09/13/2018  2:00 PM Vicie Mutters, PA-C [id number] None     HPI 42 y.o. female  presents for 6 month follow up on hypertension, cholesterol, prediabetes, and vitamin D deficiency.   Her blood pressure has been controlled at home, she is on HCTZ and metorprolol, today their BP is BP: 110/70  She does workout, she is walking. She denies chest pain, shortness of breath, dizziness. She continues to smoke, about a 1/2 a pack a day. She has tried chantix and did not like the feeling.    She is on cholesterol medication and denies myalgias. Her cholesterol is at goal. The cholesterol last visit was:   Lab Results  Component Value Date   CHOL 123 09/12/2017   HDL 33 (L) 09/12/2017   LDLCALC 66 09/12/2017   TRIG 165 (H) 09/12/2017   CHOLHDL 3.7 09/12/2017    She has been working on diet and exercise for prediabetes, and denies paresthesia of the feet, polydipsia, polyuria and visual disturbances. Last A1C in the office was:  Lab Results  Component Value Date   HGBA1C 5.2 03/17/2016   Patient is on Vitamin D supplement.   Lab Results  Component Value Date   VD25OH 79 03/17/2016     BMI is Body mass index is 23.69 kg/m., she is working on diet and exercise. Wt Readings from Last 3 Encounters:  03/20/18 138 lb (62.6 kg)  09/12/17 142 lb 3.2 oz (64.5  kg)  06/06/17 145 lb (65.8 kg)    Current Medications:  Current Outpatient Medications on File Prior to Visit  Medication Sig Dispense Refill  . cholecalciferol (VITAMIN D) 1000 UNITS tablet Take 2,000 Units by mouth daily.    . Cyanocobalamin (VITAMIN B 12 PO) Take by mouth daily.    . cyclobenzaprine (FLEXERIL) 10 MG tablet TAKE as needed at night for headache/neck 30 tablet 0  . famotidine (PEPCID) 40 MG tablet Take 40 mg by mouth daily.    . fluticasone (FLONASE) 50 MCG/ACT nasal spray Place 2 sprays into both nostrils at bedtime. 16 g 1  . hydrochlorothiazide (HYDRODIURIL) 25 MG tablet TAKE 1 TABLET BY MOUTH  DAILY 90 tablet 3  . levocetirizine (XYZAL) 5 MG tablet TAKE 1 TABLET BY MOUTH EVERY EVENING 90 tablet 3  . loratadine (CLARITIN) 10 MG tablet Take 10 mg by mouth daily.    . Magnesium 250 MG TABS Take 250 mg by mouth daily.    . metoprolol tartrate (LOPRESSOR) 50 MG tablet TAKE 1 TABLET BY MOUTH TWO  TIMES DAILY 180 tablet 1  . naproxen (NAPROSYN) 500 MG tablet Take 1 tablet (500 mg total) by mouth 2 (two) times daily. 30 tablet 0  . simvastatin (ZOCOR) 40 MG tablet TAKE 1 TABLET BY MOUTH  DAILY 90 tablet 1   No current facility-administered medications on file prior to visit.    Medical History:  Past  Medical History:  Diagnosis Date  . Allergy   . Endometriosis   . GERD (gastroesophageal reflux disease)   . Hyperlipidemia   . Hypertension   . Migraine   . Prediabetes   . Vitamin D deficiency    Allergies:  Allergies  Allergen Reactions  . Penicillins     rash     Review of Systems:  Review of Systems  Constitutional: Negative.   HENT: Negative for congestion (on claritin), ear discharge, ear pain, nosebleeds and sore throat.        + TMJ, wears night guard  Respiratory: Negative.  Negative for stridor.   Cardiovascular: Negative.   Gastrointestinal: Negative.   Genitourinary: Negative.   Musculoskeletal: Positive for myalgias (left foot). Negative for  back pain, falls, joint pain and neck pain.  Skin: Negative.   Neurological: Negative for dizziness, sensory change, seizures and headaches.  Psychiatric/Behavioral: Negative.     Family history- Review and unchanged Social history- Review and unchanged Physical Exam: BP 110/70   Pulse 70   Temp 99.4 F (37.4 C)   Resp 12   Ht 5' 4"  (1.626 m)   Wt 138 lb (62.6 kg)   LMP 03/15/2015   SpO2 98%   BMI 23.69 kg/m  Wt Readings from Last 3 Encounters:  03/20/18 138 lb (62.6 kg)  09/12/17 142 lb 3.2 oz (64.5 kg)  06/06/17 145 lb (65.8 kg)   General Appearance: Well nourished, in no apparent distress. Eyes: PERRLA, EOMs, conjunctiva no swelling or erythema Sinuses: No Frontal/maxillary tenderness ENT/Mouth: Ext aud canals clear, TMs without erythema, bulging. No erythema, swelling, or exudate on post pharynx.  Tonsils not swollen or erythematous. Hearing normal.  Neck: Supple, thyroid with small nodules on the left.  Respiratory: Respiratory effort normal, BS equal bilaterally without rales, rhonchi, wheezing or stridor.  Cardio: RRR with no MRGs. Brisk peripheral pulses without edema.  Abdomen: Soft, + BS, nontender, no guarding, rebound, hernias, masses. Lymphatics: Non tender without lymphadenopathy.  Musculoskeletal: Full ROM, 5/5 strength, Normal gait Skin: left lateral foot with Warm, dry without rashes, lesions, ecchymosis.  Neuro: Cranial nerves intact. Normal muscle tone, no cerebellar symptoms. Psych: Awake and oriented X 3, normal affect, Insight and Judgment appropriate.    Vicie Mutters, PA-C 2:37 PM [facility]

## 2018-03-20 ENCOUNTER — Encounter: Payer: Self-pay | Admitting: Physician Assistant

## 2018-03-20 ENCOUNTER — Ambulatory Visit: Payer: BLUE CROSS/BLUE SHIELD | Admitting: Physician Assistant

## 2018-03-20 VITALS — BP 110/70 | HR 70 | Temp 99.4°F | Resp 12 | Ht 64.0 in | Wt 138.0 lb

## 2018-03-20 DIAGNOSIS — F17200 Nicotine dependence, unspecified, uncomplicated: Secondary | ICD-10-CM

## 2018-03-20 DIAGNOSIS — I10 Essential (primary) hypertension: Secondary | ICD-10-CM | POA: Diagnosis not present

## 2018-03-20 DIAGNOSIS — E785 Hyperlipidemia, unspecified: Secondary | ICD-10-CM | POA: Diagnosis not present

## 2018-03-20 DIAGNOSIS — E559 Vitamin D deficiency, unspecified: Secondary | ICD-10-CM

## 2018-03-20 MED ORDER — NICOTINE 14 MG/24HR TD PT24: 14.0000 mg | MEDICATED_PATCH | TRANSDERMAL | 0 refills | Status: DC

## 2018-03-20 MED ORDER — NICOTINE 7 MG/24HR TD PT24: 7.0000 mg | MEDICATED_PATCH | TRANSDERMAL | 0 refills | Status: DC

## 2018-03-20 NOTE — Patient Instructions (Signed)
Multinodular thyroid goiter or Multinodular thyroid gland.  Goiter means thyroid enlargement so if your thyroid is enlarged it is multinodular thyroid goiter or otherwise it is called multinodular thyroid gland.  It is very common, in more than 10 % of the population. That is about 30,000 people just here in [place]. The great majority are completely benign. We do not recommend biopsy until the nodules reach 67m. Otherwise we monitor your thyroid with an ultrasound and repeated blood work monitoring its function. If they nodules are not increasing in size, we often stop monitoring with ultrasound.   [place] cancer [employer]  15073065140for more information or for a free program for smoking cessation help.   You can call QUIT SMART 1-800-QUIT-NOW for free nicotine patches or replacement therapy- if they are out- keep calling  [facility] Can call for smoking cessation classes, [phone] If you have a smart phone, please look up Smoke Free app, this will help you stay on track and give you information about money you have saved, life that you have gained back and a ton of more information.   ADVANTAGES OF QUITTING SMOKING  Within 20 minutes, blood pressure decreases. Your pulse is at normal level.  After 8 hours, carbon monoxide levels in the blood return to normal. Your oxygen level increases.  After 24 hours, the chance of having a heart attack starts to decrease. Your breath, hair, and body stop smelling like smoke.  After 48 hours, damaged nerve endings begin to recover. Your sense of taste and smell improve.  After 72 hours, the body is virtually free of nicotine. Your bronchial tubes relax and breathing becomes easier.  After 2 to 12 weeks, lungs can hold more air. Exercise becomes easier and circulation improves.  After 1 year, the risk of coronary heart disease is cut in half.  After 5 years, the risk of stroke falls to the  same as a nonsmoker.  After 10 years, the risk of lung cancer is cut in half and the risk of other cancers decreases significantly.  After 15 years, the risk of coronary heart disease drops, usually to the level of a nonsmoker.  You will have extra money to spend on things other than cigarettes.

## 2018-03-21 LAB — CBC WITH DIFFERENTIAL/PLATELET
Basophils Absolute: 60 cells/uL (ref 0–200)
Basophils Relative: 0.7 %
Eosinophils Absolute: 146 cells/uL (ref 15–500)
Eosinophils Relative: 1.7 %
HEMATOCRIT: 38.6 % (ref 35.0–45.0)
Hemoglobin: 13 g/dL (ref 11.7–15.5)
LYMPHS ABS: 3173 {cells}/uL (ref 850–3900)
MCH: 31.8 pg (ref 27.0–33.0)
MCHC: 33.7 g/dL (ref 32.0–36.0)
MCV: 94.4 fL (ref 80.0–100.0)
MPV: 14.4 fL — ABNORMAL HIGH (ref 7.5–12.5)
Monocytes Relative: 7.9 %
NEUTROS PCT: 52.8 %
Neutro Abs: 4541 cells/uL (ref 1500–7800)
Platelets: 127 10*3/uL — ABNORMAL LOW (ref 140–400)
RBC: 4.09 10*6/uL (ref 3.80–5.10)
RDW: 11.8 % (ref 11.0–15.0)
Total Lymphocyte: 36.9 %
WBC: 8.6 10*3/uL (ref 3.8–10.8)
WBCMIX: 679 {cells}/uL (ref 200–950)

## 2018-03-21 LAB — COMPLETE METABOLIC PANEL WITH GFR
AG Ratio: 2 (calc) (ref 1.0–2.5)
ALT: 12 U/L (ref 6–29)
AST: 14 U/L (ref 10–30)
Albumin: 4.4 g/dL (ref 3.6–5.1)
Alkaline phosphatase (APISO): 55 U/L (ref 33–115)
BUN: 14 mg/dL (ref 7–25)
CO2: 33 mmol/L — ABNORMAL HIGH (ref 20–32)
Calcium: 10 mg/dL (ref 8.6–10.2)
Chloride: 103 mmol/L (ref 98–110)
Creat: 0.75 mg/dL (ref 0.50–1.10)
GFR, Est African American: 112 mL/min/{1.73_m2} (ref >=60)
GFR, Est Non African American: 97 mL/min/{1.73_m2} (ref >=60)
Globulin: 2.2 g/dL (calc) (ref 1.9–3.7)
Glucose, Bld: 79 mg/dL (ref 65–99)
Potassium: 4.5 mmol/L (ref 3.5–5.3)
Sodium: 143 mmol/L (ref 135–146)
Total Bilirubin: 0.4 mg/dL (ref 0.2–1.2)
Total Protein: 6.6 g/dL (ref 6.1–8.1)

## 2018-03-21 LAB — LIPID PANEL
Cholesterol: 115 mg/dL (ref <200)
HDL: 32 mg/dL — ABNORMAL LOW (ref >50)
LDL Cholesterol (Calc): 66 mg/dL (calc)
NON-HDL CHOLESTEROL (CALC): 83 mg/dL (ref <130)
Total CHOL/HDL Ratio: 3.6 (calc) (ref <5.0)
Triglycerides: 91 mg/dL (ref <150)

## 2018-03-21 LAB — TSH: TSH: 0.51 mIU/L

## 2018-05-28 ENCOUNTER — Other Ambulatory Visit: Payer: Self-pay | Admitting: Internal Medicine

## 2018-05-28 DIAGNOSIS — E785 Hyperlipidemia, unspecified: Secondary | ICD-10-CM

## 2018-05-28 DIAGNOSIS — Z0001 Encounter for general adult medical examination with abnormal findings: Secondary | ICD-10-CM

## 2018-07-03 DIAGNOSIS — S29019A Strain of muscle and tendon of unspecified wall of thorax, initial encounter: Secondary | ICD-10-CM | POA: Diagnosis not present

## 2018-08-16 ENCOUNTER — Other Ambulatory Visit: Payer: Self-pay | Admitting: Internal Medicine

## 2018-08-16 DIAGNOSIS — I10 Essential (primary) hypertension: Secondary | ICD-10-CM

## 2018-09-11 NOTE — Progress Notes (Signed)
Complete Physical  Assessment and Plan: Essential hypertension - continue medications, DASH diet, exercise and monitor at home. Call if greater than 130/80.  - Urinalysis, Routine w reflex microscopic (not at [facility]) - Microalbumin / creatinine urine ratio  Hyperlipidemia -continue medications, check lipids, decrease fatty foods, increase activity.   Vitamin D deficiency - VITAMIN D 25 Hydroxy (Vit-D Deficiency, Fractures)  Gastroesophageal reflux disease with esophagitis Diet controlled  Gallbladder polyp Low fat diet, monitor   Other migraine without status migrainosus, not intractable Better s/p hysterectomy   Medication management - Magnesium   Encounter for general adult medical examination with abnormal findings   Tobacco use disorder Smoking cessation instruction/counseling given:  counseled patient on the dangers of tobacco use, advised patient to stop smoking, and reviewed strategies to maximize success  -     EKG 12-Lead  Paresthesias normal iron, ferritin, urine, CBC, HIV, ANA, antiDNA, CPK.  She has had normal MRI brain Normal neuro on exam Declines referral to neuro at this time ? From migraines   Grief -     buPROPion (WELLBUTRIN XL) 150 MG 24 hr tablet; Take 1 tablet (150 mg total) by mouth every morning. -     traZODone (DESYREL) 50 MG tablet; 1/2-1 tablet for sleep - given hospice number, seeing counselor at this time  Screening for diabetes mellitus -     Hemoglobin A1c  Medication management -     Magnesium   Discussed med's effects and SE's. Screening labs and tests as requested with regular follow-up as recommended.  HPI Patient presents for complete physical.    Husband passed 01/2018, she was doing well but states she feels she is doing worse, she has contacted a Social worker at work for her. She does not sleep well, she states her brain does not stop and she feels the need to keep going. She is trying to walk with her dog.   Patient's blood  pressure has been controlled at home, she is on 61m metoprolol BID and HCTZ 217mBID, still elevated. She continues to smoke. Patient denies chest pain, shortness of breath, dizziness. BP: 120/80.  Lab Results  Component Value Date   CREATININE 0.75 03/20/2018   BUN 14 03/20/2018   NA 143 03/20/2018   K 4.5 03/20/2018   CL 103 03/20/2018   CO2 33 (H) 03/20/2018   She continue to have tingling bilateral hands and feet, dizziness and occ bilateral hand tremor. Had some right foot drop/difficulty walking the other day but states this resolved, will come and come. Denies changes, in vision, weakness. She had normal TSH 03/2016, normal iron, ferritin, urine, CBC, HIV, ANA, antiDNA, CPK. She has had normal MRI.   Patient's cholesterol is diet controlled. She is on zocor and denies myalgias. The patient's cholesterol last visit was Lab Results  Component Value Date   CHOL 115 03/20/2018   HDL 32 (L) 03/20/2018   LDLCALC 66 03/20/2018   TRIG 91 03/20/2018   CHOLHDL 3.6 03/20/2018   The patient has been working on diet and exercise for prediabetes, denies changes in vision, polys, and paresthesias. Last A1C in office  Lab Results  Component Value Date   HGBA1C 5.2 03/17/2016   Thrombocytopenia history Lab Results  Component Value Date   WBC 8.6 03/20/2018   HGB 13.0 03/20/2018   HCT 38.6 03/20/2018   MCV 94.4 03/20/2018   PLT 127 (L) 03/20/2018   She had EGD and was treated for H pylori by Dr. PyHilarie Fredricksonn 2015.  She is on vitamin D Lab Results  Component Value Date   VD25OH 79 03/17/2016   BMI is Body mass index is 22.94 kg/m., she is working on diet and exercise. Wt Readings from Last 3 Encounters:  09/13/18 140 lb (63.5 kg)  03/20/18 138 lb (62.6 kg)  09/12/17 142 lb 3.2 oz (64.5 kg)   Current Medications:  Current Outpatient Medications on File Prior to Visit  Medication Sig Dispense Refill  . cholecalciferol (VITAMIN D) 1000 UNITS tablet Take 2,000 Units by mouth daily.     . Cyanocobalamin (VITAMIN B 12 PO) Take by mouth daily.    . cyclobenzaprine (FLEXERIL) 10 MG tablet TAKE as needed at night for headache/neck 30 tablet 0  . famotidine (PEPCID) 40 MG tablet Take 40 mg by mouth daily.    . fluticasone (FLONASE) 50 MCG/ACT nasal spray Place 2 sprays into both nostrils at bedtime. 16 g 1  . hydrochlorothiazide (HYDRODIURIL) 25 MG tablet TAKE 1 TABLET BY MOUTH  DAILY 90 tablet 3  . levocetirizine (XYZAL) 5 MG tablet TAKE 1 TABLET BY MOUTH EVERY EVENING 90 tablet 3  . loratadine (CLARITIN) 10 MG tablet Take 10 mg by mouth daily.    . Magnesium 250 MG TABS Take 250 mg by mouth daily.    . metoprolol tartrate (LOPRESSOR) 50 MG tablet TAKE 1 TABLET BY MOUTH TWO  TIMES DAILY 180 tablet 1  . naproxen (NAPROSYN) 500 MG tablet Take 1 tablet (500 mg total) by mouth 2 (two) times daily. 30 tablet 0  . simvastatin (ZOCOR) 40 MG tablet TAKE 1 TABLET BY MOUTH  DAILY 90 tablet 1   No current facility-administered medications on file prior to visit.    Health Maintenance: Immunization History  Administered Date(s) Administered  . Tdap 07/17/2012   Tetanus: 07/2012 Pneumovax: N/A Prevnar 13 N/A Flu vaccine: declines  Zostavax: N/A  Patient's last menstrual period was 03/15/2015. s/p hysterectomy Pap: 2016 Dr. Corinna Capra s/p hysterectomy in 2016 3D MGM:  06/2017 category C DEXA: N/A  Colonoscopy:N/A  EGD: 09/2013 Lumbar Xray 2013 CXR 2009 MRI brain 09/2016 normal  Patient Care Team: Unk Pinto, MD as PCP - General (Internal Medicine) Pyrtle, Lajuan Lines, MD as Consulting Physician (Gastroenterology) Louretta Shorten, MD as Consulting Physician (Obstetrics and Gynecology)  Medical History:  Past Medical History:  Diagnosis Date  . Allergy   . Endometriosis   . GERD (gastroesophageal reflux disease)   . Hyperlipidemia   . Hypertension   . Migraine   . Prediabetes   . Vitamin D deficiency    Allergies Allergies  Allergen Reactions  . Penicillins     rash     SURGICAL HISTORY She  has a past surgical history that includes Refractive surgery (2004) and Abdominal hysterectomy (03/2015). FAMILY HISTORY Her family history includes Breast cancer in her maternal aunt; Heart disease in her father; Hyperlipidemia in her father and mother; Hypertension in her father; Irritable bowel syndrome in her mother; Prostate cancer in her maternal grandfather. SOCIAL HISTORY She  reports that she has been smoking. She has a 10.00 pack-year smoking history. She has never used smokeless tobacco. She reports that she does not drink alcohol or use drugs.   ROS Review of Systems  Constitutional: Negative for chills, diaphoresis, fever, malaise/fatigue and weight loss.  HENT: Negative for congestion, ear discharge, ear pain, hearing loss, nosebleeds, sinus pain, sore throat and tinnitus.        + TMJ, wears night guard  Eyes: Negative.  Negative for  blurred vision and double vision.  Respiratory: Negative.  Negative for cough, hemoptysis, sputum production, shortness of breath, wheezing and stridor.   Cardiovascular: Negative for chest pain, palpitations, orthopnea, claudication, leg swelling and PND.  Gastrointestinal: Negative.  Negative for constipation, diarrhea and heartburn.  Genitourinary: Negative.  Negative for frequency.       Has white nodule on left labia  Musculoskeletal: Negative for back pain, falls, joint pain, myalgias and neck pain.  Skin: Negative.  Negative for itching and rash.  Neurological: Positive for dizziness (intermittent, last 30 seconds. with tremor occ bilateral) and tingling (bilateral hands and feet, worse bilateral hands, states had trouble lifting right foot the other day). Negative for tremors, sensory change, speech change, focal weakness, seizures, loss of consciousness, weakness and headaches.  Endo/Heme/Allergies: Negative.   Psychiatric/Behavioral: Negative for depression, hallucinations, memory loss, substance abuse and  suicidal ideas. The patient is nervous/anxious and has insomnia.    Physical Exam: Estimated body mass index is 22.94 kg/m as calculated from the following:   Height as of this encounter: 5' 5.5" (1.664 m).   Weight as of this encounter: 140 lb (63.5 kg). Vitals:   09/13/18 1344  BP: 120/80  Pulse: 68  Temp: 97.9 F (36.6 C)  SpO2: 98%   General Appearance: Well nourished, in no apparent distress. Eyes: PERRLA, EOMs, conjunctiva no swelling or erythema, normal fundi and vessels. Sinuses: No Frontal/maxillary tenderness ENT/Mouth: Ext aud canals clear, normal light reflex with TMs without erythema, bulging.  Good dentition. No erythema, swelling, or exudate on post pharynx. Tonsils not swollen or erythematous. Hearing normal.  Neck: Supple, thyroid normal. No bruits Respiratory: Respiratory effort normal, BS equal bilaterally without rales, rhonchi, wheezing or stridor. Cardio: RRR without murmurs, rubs or gallops. Brisk peripheral pulses without edema.  Chest: symmetric, with normal excursions and percussion. Breasts: defer OB/GYN Abdomen: Soft, +BS, nontender no guarding, rebound, hernias, masses, or organomegaly. .  Lymphatics: Non tender without lymphadenopathy.  Genitourinary: defer Musculoskeletal: Full ROM all peripheral extremities,5/5 strength, and normal gait. Skin: Warm, dry without rashes, lesions, ecchymosis.  Neuro: Cranial nerves intact, reflexes equal bilaterally. Normal muscle tone, no cerebellar symptoms. Sensation intact.  Psych: Awake and oriented X 3, normal affect, Insight and Judgment appropriate.   EKG: WNL, no ST changes    Vicie Mutters 1:53 PM

## 2018-09-13 ENCOUNTER — Ambulatory Visit (INDEPENDENT_AMBULATORY_CARE_PROVIDER_SITE_OTHER): Payer: BLUE CROSS/BLUE SHIELD | Admitting: Physician Assistant

## 2018-09-13 ENCOUNTER — Encounter: Payer: Self-pay | Admitting: Physician Assistant

## 2018-09-13 VITALS — BP 120/80 | HR 68 | Temp 97.9°F | Ht 65.5 in | Wt 140.0 lb

## 2018-09-13 DIAGNOSIS — G43809 Other migraine, not intractable, without status migrainosus: Secondary | ICD-10-CM

## 2018-09-13 DIAGNOSIS — Z131 Encounter for screening for diabetes mellitus: Secondary | ICD-10-CM | POA: Diagnosis not present

## 2018-09-13 DIAGNOSIS — Z1389 Encounter for screening for other disorder: Secondary | ICD-10-CM

## 2018-09-13 DIAGNOSIS — F4321 Adjustment disorder with depressed mood: Secondary | ICD-10-CM

## 2018-09-13 DIAGNOSIS — Z136 Encounter for screening for cardiovascular disorders: Secondary | ICD-10-CM | POA: Diagnosis not present

## 2018-09-13 DIAGNOSIS — K2100 Gastro-esophageal reflux disease with esophagitis, without bleeding: Secondary | ICD-10-CM

## 2018-09-13 DIAGNOSIS — Z0000 Encounter for general adult medical examination without abnormal findings: Secondary | ICD-10-CM | POA: Diagnosis not present

## 2018-09-13 DIAGNOSIS — I10 Essential (primary) hypertension: Secondary | ICD-10-CM | POA: Diagnosis not present

## 2018-09-13 DIAGNOSIS — Z1329 Encounter for screening for other suspected endocrine disorder: Secondary | ICD-10-CM

## 2018-09-13 DIAGNOSIS — R202 Paresthesia of skin: Secondary | ICD-10-CM

## 2018-09-13 DIAGNOSIS — E559 Vitamin D deficiency, unspecified: Secondary | ICD-10-CM | POA: Diagnosis not present

## 2018-09-13 DIAGNOSIS — F17200 Nicotine dependence, unspecified, uncomplicated: Secondary | ICD-10-CM

## 2018-09-13 DIAGNOSIS — E785 Hyperlipidemia, unspecified: Secondary | ICD-10-CM

## 2018-09-13 DIAGNOSIS — Z79899 Other long term (current) drug therapy: Secondary | ICD-10-CM

## 2018-09-13 DIAGNOSIS — Z13220 Encounter for screening for lipoid disorders: Secondary | ICD-10-CM

## 2018-09-13 DIAGNOSIS — K824 Cholesterolosis of gallbladder: Secondary | ICD-10-CM

## 2018-09-13 DIAGNOSIS — M5441 Lumbago with sciatica, right side: Secondary | ICD-10-CM

## 2018-09-13 MED ORDER — TRAZODONE HCL 50 MG PO TABS: ORAL_TABLET | ORAL | 2 refills | Status: DC

## 2018-09-13 MED ORDER — BUPROPION HCL ER (XL) 150 MG PO TB24: 150.0000 mg | ORAL_TABLET | ORAL | 2 refills | Status: DC

## 2018-09-13 NOTE — Patient Instructions (Addendum)
[facility] Address: [address] [place], [place] [address]  Phone: [phone] This is a free service, can find a group counseling as well  [facility]  7 a.m.-6:30 p.m., Monday 7 a.m.-5 p.m., Tuesday-Friday Schedule an appointment by calling [phone].   Will start you on wellbutrin to try to help with mood/weight loss If you get nausea and HA after the first week please stop If you get anxious or snappy with people than stop the medication But this medication can help with energy, weight loss and mood It kicks in about 1-2 weeks And can be stopped quickly  Check into meditation at night- can search apps  TRAZODONE  Try the trazodone This is not habit forming, you will not build a tolerance to it Please start out at 66m or 1/2 pill, can increase to 55mat night, and the max of this medication is 15077mt night.   Side effects can be odd dreams, dry mouth, and drowsiness.    11 Tips to Follow:  1. No caffeine after 3pm: Avoid beverages with caffeine (soda, tea, energy drinks, etc.) especially after 3pm. 2. Don't go to bed hungry: Have your evening meal at least 3 hrs. before going to sleep. It's fine to have a small bedtime snack such as a glass of milk and a few crackers but don't have a big meal. 3. Have a nightly routine before bed: Plan on "winding down" before you go to sleep. Begin relaxing about 1 hour before you go to bed. Try doing a quiet activity such as listening to calming music, reading a book or meditating. 4. Turn off the TV and ALL electronics including video games, tablets, laptops, etc. 1 hour before sleep, and keep them out of the bedroom. 5. Turn off your cell phone and all notifications (new email and text alerts) or even better, leave your phone outside your room while you sleep. Studies have shown that a part of your brain continues to respond to certain lights and sounds even  while you're still asleep. 6. Make your bedroom quiet, dark and cool. If you can't control the noise, try wearing earplugs or using a fan to block out other sounds. 7. Practice relaxation techniques. Try reading a book or meditating or drain your brain by writing a list of what you need to do the next day. 8. Don't nap unless you feel sick: you'll have a better night's sleep. 9. Don't smoke, or quit if you do. Nicotine, alcohol, and marijuana can all keep you awake. Talk to your health care provider if you need help with substance use. 10. Most importantly, wake up at the same time every day (or within 1 hour of your usual wake up time) EVEN on the weekends. A regular wake up time promotes sleep hygiene and prevents sleep problems. 11. Reduce exposure to bright light in the last three hours of the day before going to sleep. Maintaining good sleep hygiene and having good sleep habits lower your risk of developing sleep problems. Getting better sleep can also improve your concentration and alertness. Try the simple steps in this guide. If you still have trouble getting enough rest, make an appointment with your health care provider.    Paresthesia Paresthesia is an abnormal burning or prickling sensation. It is usually felt in the hands, arms, legs, or feet. However, it may occur in any part of the body. Usually, paresthesia is not painful.  It may feel like:  Tingling or numbness.  Pins and needles.  Skin crawling.  Buzzing.  Arms or legs falling asleep.  Itching. Paresthesia may occur without any clear cause, or it may be caused by:  Breathing too quickly (hyperventilation).  Pressure on a nerve.  An underlying medical condition.  Side effects of a medication.  Nutritional deficiencies.  Exposure to toxic chemicals. Most people experience temporary (transient) paresthesia at some time in their lives. For some people, it may be long-lasting (chronic) because of an underlying  medical condition. If you have paresthesia that lasts a long time, you may need to be evaluated by your health care provider. Follow these instructions at home: Alcohol use   Do not drink alcohol if: ? Your health care provider tells you not to drink. ? You are pregnant, may be pregnant, or are planning to become pregnant.  If you drink alcohol, limit how much you have: ? 0-1 drink a day for women. ? 0-2 drinks a day for men.  Be aware of how much alcohol is in your drink. In the [place] one drink equals one typical bottle of beer (12 oz), one-half glass of wine (5 oz), or one shot of hard liquor (1 oz). Nutrition  Eat a healthy diet. This includes: ? Eating foods that are high in fiber, such as fresh fruits and vegetables, whole grains, and beans. ? Limiting foods that are high in fat and processed sugars, such as fried or sweet foods. General instructions  Take over-the-counter and prescription medicines only as told by your health care provider.  Do not use any products that contain nicotine or tobacco, such as cigarettes and e-cigarettes. These can keep blood from reaching damaged nerves. If you need help quitting, ask your health care provider.  If you have diabetes, work closely with your health care provider to keep your blood sugar under control.  If you have numbness in your feet: ? Check every day for signs of injury or infection. Watch for redness, warmth, and swelling. ? Wear padded socks and comfortable shoes. These help protect your feet.  Keep all follow-up visits as told by your health care provider. This is important. Contact a health care provider if you:  Have paresthesia that gets worse or does not go away.  Have a burning or prickling feeling that gets worse when you walk.  Have pain, cramps, or dizziness.  Develop a rash. Get help right away if you:  Feel weak.  Have trouble walking or moving.  Have problems with speech, understanding, or  vision.  Feel confused.  Cannot control your bladder or bowel movements.  Have numbness after an injury.  Develop new weakness in an arm or leg.  Faint. Summary  Paresthesia is an abnormal burning or prickling sensation that is usually felt in the hands, arms, legs, or feet. It may also occur in other parts of the body.  Paresthesia may occur without any clear cause, or it may be caused by breathing too quickly (hyperventilation), pressure on a nerve, an underlying medical condition, side effects of a medication, nutritional deficiencies, or exposure to toxic chemicals.  If you have paresthesia that lasts a long time, you may need to be evaluated by your health care provider. This information is not intended to replace advice given to you by your health care provider. Make sure you discuss any questions you have with your health care provider. Document Released: 06/18/2002 Document Revised: 07/07/2017 Document Reviewed: 07/07/2017 Elsevier Interactive Patient  Education  2019 [employer] Inc[employer]    1.  Limit use of pain relievers to no more than 2 days out of week to prevent risk of rebound or medication-overuse headache. 2.  Keep headache diary 3.  Exercise, hydration, caffeine cessation, sleep hygiene, monitor for and avoid triggers 4.  Consider:  magnesium citrate 430m daily, riboflavin 4029mdaily, and coenzyme Q10 1006mhree times daily   We may also treat TMJ if we think you have it If you are having frequent migraines we may put you on a once a day medication with fast acting medication to take. Also there is such a thing called rebound headache from over use of acute medications.  Please do not use rescue or acute medications more than 10 days a month or more than 3 days per week, this can cause a withdrawal and a rebound headache.  Here is more information below  Please remember, common headache triggers are: sleep deprivation, dehydration, overheating, stress, hypoglycemia  or skipping meals and blood sugar fluctuations, excessive pain medications or excessive alcohol use or caffeine withdrawal. Some people have food triggers such as aged cheese, orange juice or chocolate, especially dark chocolate, or MSG (monosodium glutamate). Try to avoid these headache triggers as much possible.   It may be helpful to keep a headache diary to figure out what makes your headaches worse or brings them on and what alleviates them. Some people report headache onset after exercise but studies have shown that regular exercise may actually prevent headaches from coming. If you have exercise-induced headaches, please make sure that you drink plenty of fluid before and after exercising and that you do not over do it and do not overheat.   Please go to the ER if there is weakness, thunderclap headache, visual changes, or any concerning factors    Migraine Headache A migraine headache is an intense, throbbing pain on one or both sides of your head. Recurrent migraines keep coming back. A migraine can last for 30 minutes to several hours. CAUSES  The exact cause of a migraine headache is not always known. However, a migraine may be caused when nerves in the brain become irritated and release chemicals that cause inflammation. This causes pain. Certain things may also trigger migraines, such as:   Alcohol.  Smoking.  Stress.  Menstruation.  Aged cheeses.  Foods or drinks that contain nitrates, glutamate, aspartame, or tyramine.  Lack of sleep.  Chocolate.  Caffeine.  Hunger.  Physical exertion.  Fatigue.  Medicines used to treat chest pain (nitroglycerine), birth control pills, estrogen, and some blood pressure medicines. SYMPTOMS   Pain on one or both sides of your head.  Pulsating or throbbing pain.  Severe pain that prevents daily activities.  Pain that is aggravated by any physical activity.  Nausea, vomiting, or both.  Dizziness.  Pain with exposure to  bright lights, loud noises, or activity.  General sensitivity to bright lights, loud noises, or smells. Before you get a migraine, you may get warning signs that a migraine is coming (aura). An aura may include:  Seeing flashing lights.  Seeing bright spots, halos, or zigzag lines.  Having tunnel vision or blurred vision.  Having feelings of numbness or tingling.  Having trouble talking.  Having muscle weakness. DIAGNOSIS  A recurrent migraine headache is often diagnosed based on:  Symptoms.  Physical examination.  A CT scan or MRI of your head. These imaging tests cannot diagnose migraines but can help rule out other causes of  headaches.   TREATMENT  Medicines may be given for pain and nausea. Medicines can also be given to help prevent recurrent migraines. HOME CARE INSTRUCTIONS  Only take over-the-counter or prescription medicines for pain or discomfort as directed by your health care provider. The use of long-term narcotics is not recommended.  Lie down in a dark, quiet room when you have a migraine.  Keep a journal to find out what may trigger your migraine headaches. For example, write down:  What you eat and drink.  How much sleep you get.  Any change to your diet or medicines.  Limit alcohol consumption.  Quit smoking if you smoke.  Get 7-9 hours of sleep, or as recommended by your health care provider.  Limit stress.  Keep lights dim if bright lights bother you and make your migraines worse. SEEK MEDICAL CARE IF:   You do not get relief from the medicines given to you.  You have a recurrence of pain.  You have a fever. SEEK IMMEDIATE MEDICAL CARE IF:  Your migraine becomes severe.  You have a stiff neck.  You have loss of vision.  You have muscular weakness or loss of muscle control.  You start losing your balance or have trouble walking.  You feel faint or pass out. . You have severe symptoms that are different from your first  symptoms. MAKE SURE YOU:   Understand these instructions.  Will watch your condition.  Will get help right away if you are not doing well or get worse.   This information is not intended to replace advice given to you by your health care provider. Make sure you discuss any questions you have with your health care provider.   Document Released: 03/23/2001 Document Revised: 07/19/2014 Document Reviewed: 03/05/2013 Elsevier Interactive Patient Education 2016 [employer].  Common Migraine Triggers   Foods Aged cheese, alcohol, nuts, chocolate, yogurt, onions, figs, liver, caffeinated foods and beverages, monosodium glutamate (MSG), smoked or pickled fish/meat, nitrate/nitrate preserved foods (hotdogs, pepperoni, salami) tyramine  Medications Antibiotics (tetracycline, griseofulvin), antihypertensives (nifedipine, captopril), hormones (oral contraceptives, estrogens), histamine-2 blockers (cimetidine, raniidine, vasodilators (nitroglycerine, isosorbide dinitrate)  Sensory Stimuli Flickering/bright/fluorescent lights, bright sunlight, odors (perfume, chemicals, cigarette smoke)  Lifestyle Changes Time zones, sleep patterns, eating habits, caffeine withdrawal stress  Other Menstrual cycle, weather/season/air pressure changes, high altitude  Adapted from [place] and [place], [place]. [facility] [place]; [place] and [place]. Conquering Headache, 1998  Hormonal variations also are believed to play a part.  Fluctuations of the female hormone estrogen (such as just before menstruation) affect a chemical called serotonin-when serotonin levels in the brain fall, the dilation (expansion) of blood vessels in the brain that is characteristic of migraine often follows.  Many factors or "triggers" can start a migraine.  In people who get migraines, most experts think certain activities or foods may trigger temporary changes in the blood vessels around the brain.  Swelling of these blood vessels may cause pain  in the nearby nerves.  Allergy Headaches:  Hotdogs Milk  Onions  Thyme Bacon  Chocolate Garlic  Nutmeg Ham  Dark Cola Pork  Cinnamon Salami  Nuts  Egg  Ginger Sausage Red wine Cloves  Cheddar Cheese Caffeine

## 2018-09-14 ENCOUNTER — Encounter: Payer: Self-pay | Admitting: Physician Assistant

## 2018-09-14 LAB — MAGNESIUM: MAGNESIUM: 2.1 mg/dL (ref 1.5–2.5)

## 2018-09-14 LAB — CBC WITH DIFFERENTIAL/PLATELET
ABSOLUTE MONOCYTES: 728 {cells}/uL (ref 200–950)
BASOS PCT: 0.5 %
Basophils Absolute: 56 cells/uL (ref 0–200)
Eosinophils Absolute: 123 cells/uL (ref 15–500)
Eosinophils Relative: 1.1 %
HEMATOCRIT: 41.1 % (ref 35.0–45.0)
HEMOGLOBIN: 14.2 g/dL (ref 11.7–15.5)
LYMPHS ABS: 3438 {cells}/uL (ref 850–3900)
MCH: 33 pg (ref 27.0–33.0)
MCHC: 34.5 g/dL (ref 32.0–36.0)
MCV: 95.6 fL (ref 80.0–100.0)
MPV: 13.3 fL — AB (ref 7.5–12.5)
Monocytes Relative: 6.5 %
NEUTROS ABS: 6854 {cells}/uL (ref 1500–7800)
Neutrophils Relative %: 61.2 %
PLATELETS: 166 10*3/uL (ref 140–400)
RBC: 4.3 10*6/uL (ref 3.80–5.10)
RDW: 11.8 % (ref 11.0–15.0)
TOTAL LYMPHOCYTE: 30.7 %
WBC: 11.2 10*3/uL — ABNORMAL HIGH (ref 3.8–10.8)

## 2018-09-14 LAB — TSH: TSH: 0.92 m[IU]/L

## 2018-09-14 LAB — LIPID PANEL
Cholesterol: 134 mg/dL (ref <200)
HDL: 38 mg/dL — ABNORMAL LOW (ref >=50)
LDL CHOLESTEROL (CALC): 76 mg/dL
Non-HDL Cholesterol (Calc): 96 mg/dL (calc) (ref <130)
TRIGLYCERIDES: 113 mg/dL (ref <150)
Total CHOL/HDL Ratio: 3.5 (calc) (ref <5.0)

## 2018-09-14 LAB — COMPLETE METABOLIC PANEL WITH GFR
AG RATIO: 2.1 (calc) (ref 1.0–2.5)
ALBUMIN MSPROF: 4.6 g/dL (ref 3.6–5.1)
ALT: 15 U/L (ref 6–29)
AST: 17 U/L (ref 10–30)
Alkaline phosphatase (APISO): 55 U/L (ref 31–125)
BILIRUBIN TOTAL: 0.4 mg/dL (ref 0.2–1.2)
BUN: 13 mg/dL (ref 7–25)
CALCIUM: 9.8 mg/dL (ref 8.6–10.2)
CO2: 34 mmol/L — ABNORMAL HIGH (ref 20–32)
Chloride: 102 mmol/L (ref 98–110)
Creat: 0.69 mg/dL (ref 0.50–1.10)
GFR, EST AFRICAN AMERICAN: 123 mL/min/{1.73_m2} (ref >=60)
GFR, Est Non African American: 106 mL/min/{1.73_m2} (ref >=60)
Globulin: 2.2 g/dL (calc) (ref 1.9–3.7)
Glucose, Bld: 81 mg/dL (ref 65–99)
POTASSIUM: 3.7 mmol/L (ref 3.5–5.3)
Sodium: 142 mmol/L (ref 135–146)
TOTAL PROTEIN: 6.8 g/dL (ref 6.1–8.1)

## 2018-09-14 LAB — URINALYSIS, ROUTINE W REFLEX MICROSCOPIC
BILIRUBIN URINE: NEGATIVE
GLUCOSE, UA: NEGATIVE
HGB URINE DIPSTICK: NEGATIVE
KETONES UR: NEGATIVE
Leukocytes,Ua: NEGATIVE
Nitrite: NEGATIVE
PROTEIN: NEGATIVE
Specific Gravity, Urine: 1.008 (ref 1.001–1.03)
pH: 7 (ref 5.0–8.0)

## 2018-09-14 LAB — MICROALBUMIN / CREATININE URINE RATIO
CREATININE, URINE: 29 mg/dL (ref 20–275)
Microalb, Ur: <0.2 mg/dL

## 2018-09-14 LAB — HEMOGLOBIN A1C
EAG (MMOL/L): 5.7 (calc)
HEMOGLOBIN A1C: 5.2 %{Hb} (ref <5.7)
MEAN PLASMA GLUCOSE: 103 (calc)

## 2018-09-14 LAB — VITAMIN D 25 HYDROXY (VIT D DEFICIENCY, FRACTURES): VIT D 25 HYDROXY: 86 ng/mL (ref 30–100)

## 2018-11-14 ENCOUNTER — Other Ambulatory Visit: Payer: Self-pay | Admitting: Adult Health

## 2018-11-14 DIAGNOSIS — E785 Hyperlipidemia, unspecified: Secondary | ICD-10-CM

## 2018-11-14 DIAGNOSIS — Z0001 Encounter for general adult medical examination with abnormal findings: Secondary | ICD-10-CM

## 2018-12-02 ENCOUNTER — Other Ambulatory Visit: Payer: Self-pay | Admitting: Internal Medicine

## 2018-12-02 DIAGNOSIS — Z9109 Other allergy status, other than to drugs and biological substances: Secondary | ICD-10-CM

## 2018-12-02 DIAGNOSIS — Z0001 Encounter for general adult medical examination with abnormal findings: Secondary | ICD-10-CM

## 2018-12-20 ENCOUNTER — Ambulatory Visit: Payer: BC Managed Care – PPO | Admitting: Physician Assistant

## 2018-12-20 ENCOUNTER — Encounter: Payer: Self-pay | Admitting: Physician Assistant

## 2018-12-20 ENCOUNTER — Other Ambulatory Visit: Payer: Self-pay

## 2018-12-20 VITALS — BP 120/66 | HR 71 | Temp 97.7°F | Ht 65.5 in | Wt 139.2 lb

## 2018-12-20 DIAGNOSIS — I10 Essential (primary) hypertension: Secondary | ICD-10-CM | POA: Diagnosis not present

## 2018-12-20 DIAGNOSIS — Z79899 Other long term (current) drug therapy: Secondary | ICD-10-CM

## 2018-12-20 DIAGNOSIS — F17200 Nicotine dependence, unspecified, uncomplicated: Secondary | ICD-10-CM

## 2018-12-20 DIAGNOSIS — L723 Sebaceous cyst: Secondary | ICD-10-CM | POA: Diagnosis not present

## 2018-12-20 DIAGNOSIS — E559 Vitamin D deficiency, unspecified: Secondary | ICD-10-CM | POA: Diagnosis not present

## 2018-12-20 DIAGNOSIS — E785 Hyperlipidemia, unspecified: Secondary | ICD-10-CM

## 2018-12-20 NOTE — Progress Notes (Signed)
Assessment and Plan:   Essential hypertension - continue medications, DASH diet, exercise and monitor at home. Call if greater than 130/80.  -     CBC with Differential/Platelet -     COMPLETE METABOLIC PANEL WITH GFR -     TSH  Sebaceous cyst ? On left trap near tick bite Likely seb cyst inflammed Will do heating pad, monitor, if not better will do doxy/get [place] No fever, chills, HA, joint pain,e tc.   Hyperlipidemia, unspecified hyperlipidemia type -     Lipid panel check lipids decrease fatty foods increase activity.   Tobacco use disorder Smoking cessation-  instruction/counseling given, counseled patient on the dangers of tobacco use, advised patient to stop smoking, and reviewed strategies to maximize success, patient not ready to quit at this time.   Vitamin D deficiency -     VITAMIN D 25 Hydroxy (Vit-D Deficiency, Fractures)  Medication management -     Magnesium   Continue diet and meds as discussed. Further disposition pending results of labs. Over 30 minutes of exam, counseling, chart review, and critical decision making was performed  Future Appointments  Date Time Provider Lindsay  04/03/2019  8:45 AM Vicie Mutters, PA-C [facility] None  09/18/2019  2:00 PM Vicie Mutters, PA-C [facility] None     HPI 43 y.o. female  presents for 6 month follow up on hypertension, cholesterol, prediabetes, and vitamin D deficiency.   She is working from home now, going into the office once a week, she started to see a therapist through her work.    Her blood pressure has been controlled at home, she is on HCTZ and metorprolol, today their BP is BP: 120/66  She does workout, she is walking. She denies chest pain, shortness of breath, dizziness. She continues to smoke, about a 1/2 a pack a day. She has tried chantix and did not like the feeling.    She is on cholesterol medication and denies myalgias. Her cholesterol is at goal. The cholesterol last visit was:    Lab Results  Component Value Date   CHOL 134 09/13/2018   HDL 38 (L) 09/13/2018   LDLCALC 76 09/13/2018   TRIG 113 09/13/2018   CHOLHDL 3.5 09/13/2018    She has been working on diet and exercise for prediabetes, and denies paresthesia of the feet, polydipsia, polyuria and visual disturbances. Last A1C in the office was:  Lab Results  Component Value Date   HGBA1C 5.2 09/13/2018   Patient is on Vitamin D supplement.   Lab Results  Component Value Date   VD25OH 86 09/13/2018     BMI is Body mass index is 22.81 kg/m., she is working on diet and exercise. Wt Readings from Last 3 Encounters:  12/20/18 139 lb 3.2 oz (63.1 kg)  09/13/18 140 lb (63.5 kg)  03/20/18 138 lb (62.6 kg)    Current Medications:  Current Outpatient Medications on File Prior to Visit  Medication Sig Dispense Refill  . buPROPion (WELLBUTRIN XL) 150 MG 24 hr tablet Take 1 tablet (150 mg total) by mouth every morning. 90 tablet 2  . cholecalciferol (VITAMIN D) 1000 UNITS tablet Take 2,000 Units by mouth daily.    . Cyanocobalamin (VITAMIN B 12 PO) Take by mouth daily.    . cyclobenzaprine (FLEXERIL) 10 MG tablet TAKE as needed at night for headache/neck 30 tablet 0  . famotidine (PEPCID) 40 MG tablet Take 40 mg by mouth daily.    . fluticasone (FLONASE) 50 MCG/ACT nasal  spray Place 2 sprays into both nostrils at bedtime. 16 g 1  . hydrochlorothiazide (HYDRODIURIL) 25 MG tablet TAKE 1 TABLET BY MOUTH  DAILY 90 tablet 3  . levocetirizine (XYZAL) 5 MG tablet Take 1 tablet daily for Allergies 90 tablet 3  . loratadine (CLARITIN) 10 MG tablet Take 10 mg by mouth daily.    . Magnesium 250 MG TABS Take 250 mg by mouth daily.    . metoprolol tartrate (LOPRESSOR) 50 MG tablet TAKE 1 TABLET BY MOUTH TWO  TIMES DAILY 180 tablet 1  . naproxen (NAPROSYN) 500 MG tablet Take 1 tablet (500 mg total) by mouth 2 (two) times daily. 30 tablet 0  . simvastatin (ZOCOR) 40 MG tablet TAKE 1 TABLET BY MOUTH  DAILY 90 tablet 1  .  traZODone (DESYREL) 50 MG tablet 1/2-1 tablet for sleep 90 tablet 2   No current facility-administered medications on file prior to visit.    Medical History:  Past Medical History:  Diagnosis Date  . Allergy   . Endometriosis   . GERD (gastroesophageal reflux disease)   . Hyperlipidemia   . Hypertension   . Migraine   . Prediabetes   . Vitamin D deficiency    Allergies:  Allergies  Allergen Reactions  . Penicillins     rash     Review of Systems:  Review of Systems  Constitutional: Negative.   HENT: Negative for congestion (on claritin), ear discharge, ear pain, nosebleeds and sore throat.        + TMJ, wears night guard  Respiratory: Negative.  Negative for stridor.   Cardiovascular: Negative.   Gastrointestinal: Negative.   Genitourinary: Negative.   Musculoskeletal: Positive for myalgias (left foot). Negative for back pain, falls, joint pain and neck pain.  Skin: Negative.   Neurological: Negative for dizziness, sensory change, seizures and headaches.  Psychiatric/Behavioral: Negative.     Family history- Review and unchanged Social history- Review and unchanged Physical Exam: BP 120/66   Pulse 71   Temp 97.7 F (36.5 C)   Ht 5' 5.5" (1.664 m)   Wt 139 lb 3.2 oz (63.1 kg)   LMP 03/15/2015   SpO2 98%   BMI 22.81 kg/m  Wt Readings from Last 3 Encounters:  12/20/18 139 lb 3.2 oz (63.1 kg)  09/13/18 140 lb (63.5 kg)  03/20/18 138 lb (62.6 kg)   General Appearance: Well nourished, in no apparent distress. Eyes: PERRLA, EOMs, conjunctiva no swelling or erythema Sinuses: No Frontal/maxillary tenderness ENT/Mouth: Ext aud canals clear, TMs without erythema, bulging. No erythema, swelling, or exudate on post pharynx.  Tonsils not swollen or erythematous. Hearing normal.  Neck: Supple, thyroid with small nodules on the left.  Respiratory: Respiratory effort normal, BS equal bilaterally without rales, rhonchi, wheezing or stridor.  Cardio: RRR with no MRGs.  Brisk peripheral pulses without edema.  Abdomen: Soft, + BS, nontender, no guarding, rebound, hernias, masses. Lymphatics: Non tender without lymphadenopathy.  Musculoskeletal: left middle trap with round, non tender, mobile nodule. No warmth, erythema. Full ROM, 5/5 strength, Normal gait Skin: left lateral foot with Warm, dry without rashes, lesions, ecchymosis.  Neuro: Cranial nerves intact. Normal muscle tone, no cerebellar symptoms. Psych: Awake and oriented X 3, normal affect, Insight and Judgment appropriate.    Vicie Mutters, PA-C 4:37 PM [facility]

## 2018-12-20 NOTE — Patient Instructions (Addendum)
In 2 weeks if the bump is still on your left shoulder will send in short course of Doxy. Message and let me know   Epidermal Cyst  An epidermal cyst is a sac made of skin tissue. The sac contains a substance called keratin. Keratin is a protein that is normally secreted through the hair follicles. When keratin becomes trapped in the top layer of skin (epidermis), it can form an epidermal cyst. Epidermal cysts can be found anywhere on your body. These cysts are usually harmless (benign), and they may not cause symptoms unless they become infected. What are the causes? This condition may be caused by:  A blocked hair follicle.  A hair that curls and re-enters the skin instead of growing straight out of the skin (ingrown hair).  A blocked pore.  Irritated skin.  An injury to the skin.  Certain conditions that are passed along from parent to child (inherited).  Human papillomavirus (HPV).  Long-term (chronic) sun damage to the skin. What increases the risk? The following factors may make you more likely to develop an epidermal cyst:  Having acne.  Being overweight.  Being 53-64 years old. What are the signs or symptoms? The only symptom of this condition may be a small, painless lump underneath the skin. When an epidermal cyst ruptures, it may become infected. Symptoms may include:  Redness.  Inflammation.  Tenderness.  Warmth.  Fever.  Keratin draining from the cyst. Keratin is grayish-white, bad-smelling substance.  Pus draining from the cyst. How is this diagnosed? This condition is diagnosed with a physical exam.  In some cases, you may have a sample of tissue (biopsy) taken from your cyst to be examined under a microscope or tested for bacteria.  You may be referred to a health care provider who specializes in skin care (dermatologist). How is this treated? In many cases, epidermal cysts go away on their own without treatment. If a cyst becomes infected,  treatment may include:  Opening and draining the cyst, done by a health care provider. After draining, minor surgery to remove the rest of the cyst may be done.  Antibiotic medicine.  Injections of medicines (steroids) that help to reduce inflammation.  Surgery to remove the cyst. Surgery may be done if the cyst: ? Becomes large. ? Bothers you. ? Has a chance of turning into cancer.  Do not try to open a cyst yourself. Follow these instructions at home:  Take over-the-counter and prescription medicines only as told by your health care provider.  If you were prescribed an antibiotic medicine, take it it as told by your health care provider. Do not stop using the antibiotic even if you start to feel better.  Keep the area around your cyst clean and dry.  Wear loose, dry clothing.  Avoid touching your cyst.  Check your cyst every day for signs of infection. Check for: ? Redness, swelling, or pain. ? Fluid or blood. ? Warmth. ? Pus or a bad smell.  Keep all follow-up visits as told by your health care provider. This is important. How is this prevented?  Wear clean, dry, clothing.  Avoid wearing tight clothing.  Keep your skin clean and dry. Take showers or baths every day. Contact a health care provider if:  Your cyst develops symptoms of infection.  Your condition is not improving or is getting worse.  You develop a cyst that looks different from other cysts you have had.  You have a fever. Get help right away  if:  Redness spreads from the cyst into the surrounding area. Summary  An epidermal cyst is a sac made of skin tissue. These cysts are usually harmless (benign), and they may not cause symptoms unless they become infected.  If a cyst becomes infected, treatment may include surgery to open and drain the cyst, or to remove it. Treatment may also include medicines by mouth or through an injection.  Take over-the-counter and prescription medicines only as told  by your health care provider. If you were prescribed an antibiotic medicine, take it as told by your health care provider. Do not stop using the antibiotic even if you start to feel better.  Contact a health care provider if your condition is not improving or is getting worse.  Keep all follow-up visits as told by your health care provider. This is important. This information is not intended to replace advice given to you by your health care provider. Make sure you discuss any questions you have with your health care provider. Document Released: 05/29/2004 Document Revised: 01/09/2018 Document Reviewed: 01/09/2018 Elsevier Interactive Patient Education  2019 [place] lower leg venous system is not the most reliable, the heart does NOT pump fluid up, there is a valve system.  The muscles of the leg squeeze and the blood moves up and a valve opens and close, then they squeeze, blood moves up and valves open and closes keeping the blood moving towards the heart.  Lots can go wrong with this valve system.  If someone is sitting or standing without movement, everyone will get swelling.  THINGS TO DO:  Do not stand or sit in one position for long periods of time. Do not sit with your legs crossed. Rest with your legs raised during the day.  Your legs have to be higher than your heart so that gravity will force the valves to open, so please really elevate your legs.   Wear elastic stockings or support hose. Do not wear other tight, encircling garments around the legs, pelvis, or waist.  [employer]  has a wide variety of well priced compression stockings. [place], [place] [address] [id number] [place] has a good cheap selection, I like the socks, they are not as hard to get on  Walk as much as possible to increase blood flow.  Raise the foot of your bed at night with 2-inch blocks.  SEEK MEDICAL CARE IF:   The skin around your ankle  starts to break down.  You have pain, redness, tenderness, or hard swelling developing in your leg over a vein.  You are uncomfortable due to leg pain.  If you ever have shortness of breath with exertion or chest pain go to the ER.

## 2018-12-21 LAB — CBC WITH DIFFERENTIAL/PLATELET
Absolute Monocytes: 573 cells/uL (ref 200–950)
Basophils Absolute: 50 cells/uL (ref 0–200)
Basophils Relative: 0.6 %
Eosinophils Absolute: 158 cells/uL (ref 15–500)
Eosinophils Relative: 1.9 %
HCT: 39 % (ref 35.0–45.0)
Hemoglobin: 13.4 g/dL (ref 11.7–15.5)
Lymphs Abs: 2897 cells/uL (ref 850–3900)
MCH: 32.7 pg (ref 27.0–33.0)
MCHC: 34.4 g/dL (ref 32.0–36.0)
MCV: 95.1 fL (ref 80.0–100.0)
MPV: 14.4 fL — ABNORMAL HIGH (ref 7.5–12.5)
Monocytes Relative: 6.9 %
Neutro Abs: 4623 cells/uL (ref 1500–7800)
Neutrophils Relative %: 55.7 %
Platelets: 132 10*3/uL — ABNORMAL LOW (ref 140–400)
RBC: 4.1 10*6/uL (ref 3.80–5.10)
RDW: 12 % (ref 11.0–15.0)
Total Lymphocyte: 34.9 %
WBC: 8.3 10*3/uL (ref 3.8–10.8)

## 2018-12-21 LAB — COMPLETE METABOLIC PANEL WITH GFR
AG Ratio: 2.2 (calc) (ref 1.0–2.5)
ALT: 13 U/L (ref 6–29)
AST: 12 U/L (ref 10–35)
Albumin: 4.4 g/dL (ref 3.6–5.1)
Alkaline phosphatase (APISO): 57 U/L (ref 31–125)
BUN: 13 mg/dL (ref 7–25)
CO2: 34 mmol/L — ABNORMAL HIGH (ref 20–32)
Calcium: 9.8 mg/dL (ref 8.6–10.2)
Chloride: 104 mmol/L (ref 98–110)
Creat: 0.63 mg/dL (ref 0.50–1.10)
GFR, Est African American: 126 mL/min/{1.73_m2} (ref >=60)
GFR, Est Non African American: 108 mL/min/{1.73_m2} (ref >=60)
Globulin: 2 g/dL (calc) (ref 1.9–3.7)
Glucose, Bld: 92 mg/dL (ref 65–99)
Potassium: 3.9 mmol/L (ref 3.5–5.3)
Sodium: 143 mmol/L (ref 135–146)
Total Bilirubin: 0.4 mg/dL (ref 0.2–1.2)
Total Protein: 6.4 g/dL (ref 6.1–8.1)

## 2018-12-21 LAB — LIPID PANEL
Cholesterol: 129 mg/dL (ref <200)
HDL: 31 mg/dL — ABNORMAL LOW (ref >=50)
LDL Cholesterol (Calc): 76 mg/dL (calc)
Non-HDL Cholesterol (Calc): 98 mg/dL (calc) (ref <130)
Total CHOL/HDL Ratio: 4.2 (calc) (ref <5.0)
Triglycerides: 132 mg/dL (ref <150)

## 2018-12-21 LAB — MAGNESIUM: Magnesium: 2 mg/dL (ref 1.5–2.5)

## 2018-12-21 LAB — VITAMIN D 25 HYDROXY (VIT D DEFICIENCY, FRACTURES): Vit D, 25-Hydroxy: 91 ng/mL (ref 30–100)

## 2018-12-21 LAB — TSH: TSH: 0.77 mIU/L

## 2019-04-02 NOTE — Progress Notes (Signed)
Assessment and Plan:   Essential hypertension - continue medications, DASH diet, exercise and monitor at home. Call if greater than 130/80.  -     CBC with Differential/Platelet -     COMPLETE METABOLIC PANEL WITH GFR -     TSH  Hyperlipidemia, unspecified hyperlipidemia type -     Lipid panel check lipids decrease fatty foods increase activity.   Tobacco use disorder Smoking cessation-  instruction/counseling given, counseled patient on the dangers of tobacco use, advised patient to stop smoking, and reviewed strategies to maximize success, patient not ready to quit at this time.   Vitamin D deficiency -     VITAMIN D 25 Hydroxy (Vit-D Deficiency, Fractures)  Medication management -     Magnesium  Paraesthesia Has had negative autoimmune Check Zinc/copper Evaluate neck if not improved  Continue diet and meds as discussed. Further disposition pending results of labs. Over 30 minutes of exam, counseling, chart review, and critical decision making was performed  Future Appointments  Date Time Provider Virgie  09/18/2019  2:00 PM Vicie Mutters, PA-C [facility] None     HPI 43 y.o. female  presents for 6 month follow up on hypertension, cholesterol, prediabetes, and vitamin D deficiency.   She is working from home now, going into the office once a week, she started to see a therapist through her work.    Her blood pressure has been controlled at home, she is on HCTZ and metorprolol, today their BP is BP: 118/76  BMI is Body mass index is 22.94 kg/m., she is working on diet and exercise. Wt Readings from Last 3 Encounters:  04/05/19 140 lb (63.5 kg)  12/20/18 139 lb 3.2 oz (63.1 kg)  09/13/18 140 lb (63.5 kg)     She does workout, she is walking. She denies chest pain, shortness of breath, dizziness.  She continues to smoke, about a 1/2 a pack a day. She has tried chantix and did not like the feeling.  She is still on wellbutrin, working at the office and is  trying to cut back on smoking.    She is on cholesterol medication and denies myalgias. Her cholesterol is at goal. The cholesterol last visit was:   Lab Results  Component Value Date   CHOL 129 12/20/2018   HDL 31 (L) 12/20/2018   LDLCALC 76 12/20/2018   TRIG 132 12/20/2018   CHOLHDL 4.2 12/20/2018    She has been working on diet and exercise for prediabetes, and denies paresthesia of the feet, polydipsia, polyuria and visual disturbances. Last A1C in the office was:  Lab Results  Component Value Date   HGBA1C 5.2 09/13/2018   Patient is on Vitamin D supplement.   Lab Results  Component Value Date   VD25OH 91 12/20/2018      Current Medications:  Current Outpatient Medications on File Prior to Visit  Medication Sig Dispense Refill  . buPROPion (WELLBUTRIN XL) 150 MG 24 hr tablet Take 1 tablet (150 mg total) by mouth every morning. 90 tablet 2  . cholecalciferol (VITAMIN D) 1000 UNITS tablet Take 2,000 Units by mouth daily.    . Cyanocobalamin (VITAMIN B 12 PO) Take by mouth daily.    . cyclobenzaprine (FLEXERIL) 10 MG tablet TAKE as needed at night for headache/neck 30 tablet 0  . famotidine (PEPCID) 40 MG tablet Take 40 mg by mouth daily.    . fluticasone (FLONASE) 50 MCG/ACT nasal spray Place 2 sprays into both nostrils at bedtime. 16 g  1  . hydrochlorothiazide (HYDRODIURIL) 25 MG tablet TAKE 1 TABLET BY MOUTH  DAILY 90 tablet 3  . levocetirizine (XYZAL) 5 MG tablet Take 1 tablet daily for Allergies 90 tablet 3  . loratadine (CLARITIN) 10 MG tablet Take 10 mg by mouth daily.    . Magnesium 250 MG TABS Take 250 mg by mouth daily.    . metoprolol tartrate (LOPRESSOR) 50 MG tablet TAKE 1 TABLET BY MOUTH TWO  TIMES DAILY 180 tablet 1  . naproxen (NAPROSYN) 500 MG tablet Take 1 tablet (500 mg total) by mouth 2 (two) times daily. 30 tablet 0  . simvastatin (ZOCOR) 40 MG tablet TAKE 1 TABLET BY MOUTH  DAILY 90 tablet 1  . traZODone (DESYREL) 50 MG tablet 1/2-1 tablet for sleep 90  tablet 2   No current facility-administered medications on file prior to visit.    Medical History:  Past Medical History:  Diagnosis Date  . Allergy   . Endometriosis   . GERD (gastroesophageal reflux disease)   . Hyperlipidemia   . Hypertension   . Migraine   . Prediabetes   . Vitamin D deficiency    Allergies:  Allergies  Allergen Reactions  . Penicillins     rash     Review of Systems:  Review of Systems  Constitutional: Negative.   HENT: Negative for congestion (on claritin), ear discharge, ear pain, nosebleeds and sore throat.        + TMJ, wears night guard  Respiratory: Negative.  Negative for stridor.   Cardiovascular: Negative.   Gastrointestinal: Negative.   Genitourinary: Negative.   Musculoskeletal: Positive for myalgias (left foot). Negative for back pain, falls, joint pain and neck pain.  Skin: Negative.   Neurological: Negative for dizziness, sensory change, seizures and headaches.  Psychiatric/Behavioral: Negative.     Family history- Review and unchanged Social history- Review and unchanged Physical Exam: BP 118/76   Pulse 75   Temp (!) 97.3 F (36.3 C)   Wt 140 lb (63.5 kg)   LMP 03/15/2015   SpO2 99%   BMI 22.94 kg/m  Wt Readings from Last 3 Encounters:  04/05/19 140 lb (63.5 kg)  12/20/18 139 lb 3.2 oz (63.1 kg)  09/13/18 140 lb (63.5 kg)   General Appearance: Well nourished, in no apparent distress. Eyes: PERRLA, EOMs, conjunctiva no swelling or erythema Sinuses: No Frontal/maxillary tenderness ENT/Mouth: Ext aud canals clear, TMs without erythema, bulging. No erythema, swelling, or exudate on post pharynx.  Tonsils not swollen or erythematous. Hearing normal.  Neck: Supple, thyroid with small nodules on the left.  Respiratory: Respiratory effort normal, BS equal bilaterally without rales, rhonchi, wheezing or stridor.  Cardio: RRR with no MRGs. Brisk peripheral pulses without edema.  Abdomen: Soft, + BS, nontender, no guarding,  rebound, hernias, masses. Lymphatics: Non tender without lymphadenopathy.  Musculoskeletal: left middle trap with round, non tender, mobile nodule. No warmth, erythema. Full ROM, 5/5 strength, Normal gait Skin: left lateral foot with Warm, dry without rashes, lesions, ecchymosis.  Neuro: Cranial nerves intact. Normal muscle tone, no cerebellar symptoms. Psych: Awake and oriented X 3, normal affect, Insight and Judgment appropriate.    Vicie Mutters, PA-C 8:50 AM [facility]

## 2019-04-03 ENCOUNTER — Ambulatory Visit: Payer: BC Managed Care – PPO | Admitting: Physician Assistant

## 2019-04-05 ENCOUNTER — Other Ambulatory Visit: Payer: Self-pay

## 2019-04-05 ENCOUNTER — Encounter: Payer: Self-pay | Admitting: Physician Assistant

## 2019-04-05 ENCOUNTER — Ambulatory Visit: Payer: BC Managed Care – PPO | Admitting: Physician Assistant

## 2019-04-05 VITALS — BP 118/76 | HR 75 | Temp 97.3°F | Wt 140.0 lb

## 2019-04-05 DIAGNOSIS — Z79899 Other long term (current) drug therapy: Secondary | ICD-10-CM | POA: Diagnosis not present

## 2019-04-05 DIAGNOSIS — R202 Paresthesia of skin: Secondary | ICD-10-CM

## 2019-04-05 DIAGNOSIS — E785 Hyperlipidemia, unspecified: Secondary | ICD-10-CM | POA: Diagnosis not present

## 2019-04-05 DIAGNOSIS — I10 Essential (primary) hypertension: Secondary | ICD-10-CM | POA: Diagnosis not present

## 2019-04-05 DIAGNOSIS — F17200 Nicotine dependence, unspecified, uncomplicated: Secondary | ICD-10-CM

## 2019-04-05 DIAGNOSIS — E559 Vitamin D deficiency, unspecified: Secondary | ICD-10-CM | POA: Diagnosis not present

## 2019-04-05 NOTE — Patient Instructions (Signed)
Costochondritis  Costochondritis is swelling and irritation (inflammation) of the tissue (cartilage) that connects your ribs to your breastbone (sternum). This causes pain in the front of your chest. The pain usually starts gradually and involves more than one rib. What are the causes? The exact cause of this condition is not always known. It results from stress on the cartilage where your ribs attach to your sternum. The cause of this stress could be:  Chest injury (trauma).  Exercise or activity, such as lifting.  Severe coughing. What increases the risk? You may be at higher risk for this condition if you:  Are female.  Are 45?45 years old.  Recently started a new exercise or work activity.  Have low levels of vitamin D.  Have a condition that makes you cough frequently. What are the signs or symptoms? The main symptom of this condition is chest pain. The pain:  Usually starts gradually and can be sharp or dull.  Gets worse with deep breathing, coughing, or exercise.  Gets better with rest.  May be worse when you press on the sternum-rib connection (tenderness). How is this diagnosed? This condition is diagnosed based on your symptoms, medical history, and a physical exam. Your health care provider will check for tenderness when pressing on your sternum. This is the most important finding. You may also have tests to rule out other causes of chest pain. These may include:  A chest X-ray to check for lung problems.  An electrocardiogram (ECG) to see if you have a heart problem that could be causing the pain.  An imaging scan to rule out a chest or rib fracture. How is this treated? This condition usually goes away on its own over time. Your health care provider may prescribe an NSAID to reduce pain and inflammation. Your health care provider may also suggest that you:  Rest and avoid activities that make pain worse.  Apply heat or cold to the area to reduce pain and  inflammation.  Do exercises to stretch your chest muscles. If these treatments do not help, your health care provider may inject a numbing medicine at the sternum-rib connection to help relieve the pain. Follow these instructions at home:  Avoid activities that make pain worse. This includes any activities that use chest, abdominal, and side muscles.  If directed, put ice on the painful area: ? Put ice in a plastic bag. ? Place a towel between your skin and the bag. ? Leave the ice on for 20 minutes, 2-3 times a day.  If directed, apply heat to the affected area as often as told by your health care provider. Use the heat source that your health care provider recommends, such as a moist heat pack or a heating pad. ? Place a towel between your skin and the heat source. ? Leave the heat on for 20-30 minutes. ? Remove the heat if your skin turns bright red. This is especially important if you are unable to feel pain, heat, or cold. You may have a greater risk of getting burned.  Take over-the-counter and prescription medicines only as told by your health care provider.  Return to your normal activities as told by your health care provider. Ask your health care provider what activities are safe for you.  Keep all follow-up visits as told by your health care provider. This is important. Contact a health care provider if:  You have chills or a fever.  Your pain does not go away or it gets  worse.  You have a cough that does not go away (is persistent). Get help right away if:  You have shortness of breath. This information is not intended to replace advice given to you by your health care provider. Make sure you discuss any questions you have with your health care provider. Document Released: 04/07/2005 Document Revised: 07/13/2017 Document Reviewed: 10/22/2015 Elsevier Patient Education  2020 [place]  [phone] for more  information or for a free program for smoking cessation help.   You can call QUIT SMART 1-800-QUIT-NOW for free nicotine patches or replacement therapy- if they are out- keep calling  [facility] Can call for smoking cessation classes, [phone]  If you have a smart phone, please look up Smoke Free app, this will help you stay on track and give you information about money you have saved, life that you have gained back and a ton of more information.     ADVANTAGES OF QUITTING SMOKING  Within 20 minutes, blood pressure decreases. Your pulse is at normal level.  After 8 hours, carbon monoxide levels in the blood return to normal. Your oxygen level increases.  After 24 hours, the chance of having a heart attack starts to decrease. Your breath, hair, and body stop smelling like smoke.  After 48 hours, damaged nerve endings begin to recover. Your sense of taste and smell improve.  After 72 hours, the body is virtually free of nicotine. Your bronchial tubes relax and breathing becomes easier.  After 2 to 12 weeks, lungs can hold more air. Exercise becomes easier and circulation improves.  After 1 year, the risk of coronary heart disease is cut in half.  After 5 years, the risk of stroke falls to the same as a nonsmoker.  After 10 years, the risk of lung cancer is cut in half and the risk of other cancers decreases significantly.  After 15 years, the risk of coronary heart disease drops, usually to the level of a nonsmoker.  You will have extra money to spend on things other than cigarettes.  Here is some information to help you keep your heart healthy: Move it! - Aim for 30 mins of activity every day. Take it slowly at first. Talk to [place] before starting any new exercise program.   Lose it.  -Body Mass Index (BMI) can indicate if you need to lose weight. A healthy range is 18.5-24.9. For a BMI calculator, go to [employer]  Waist Management -Excess abdominal fat  is a risk factor for heart disease, diabetes, asthma, stroke and more. Ideal waist circumference is less than 35" for women and less than 40" for men.   Eat Right -focus on fruits, vegetables, whole grains, and meals you make yourself. Avoid foods with trans fat and high sugar/sodium content.   Snooze or Snore? - Loud snoring can be a sign of sleep apnea, a significant risk factor for high blood pressure, heart attach, stroke, and heart arrhythmias.  Kick the habit -Quit Smoking! Avoid second hand smoke. A single cigarette raises your blood pressure for 20 mins and increases the risk of heart attack and stroke for the next 24 hours.   Are Aspirin and Supplements right for you? -Add ENTERIC COATED low dose 81 mg Aspirin daily OR can do every other day if you have easy bruising to protect your heart and head. As well as to reduce risk of Colon Cancer by 20 %, Skin Cancer by 26 % ,  Melanoma by 46% and Pancreatic cancer by 60%  Say "No to Stress -There may be little you can do about problems that cause stress. However, techniques such as long walks, meditation, and exercise can help you manage it.   Start Now! - Make changes one at a time and set reasonable goals to increase your likelihood of success.

## 2019-04-06 LAB — COMPLETE METABOLIC PANEL WITH GFR
AG Ratio: 2.3 (calc) (ref 1.0–2.5)
ALT: 14 U/L (ref 6–29)
AST: 14 U/L (ref 10–35)
Albumin: 4.3 g/dL (ref 3.6–5.1)
Alkaline phosphatase (APISO): 51 U/L (ref 31–125)
BUN: 15 mg/dL (ref 7–25)
CO2: 29 mmol/L (ref 20–32)
Calcium: 9.5 mg/dL (ref 8.6–10.2)
Chloride: 107 mmol/L (ref 98–110)
Creat: 0.66 mg/dL (ref 0.50–1.10)
GFR, Est African American: 124 mL/min/{1.73_m2} (ref >=60)
GFR, Est Non African American: 107 mL/min/{1.73_m2} (ref >=60)
Globulin: 1.9 g/dL (calc) (ref 1.9–3.7)
Glucose, Bld: 68 mg/dL (ref 65–99)
Potassium: 3.9 mmol/L (ref 3.5–5.3)
Sodium: 142 mmol/L (ref 135–146)
Total Bilirubin: 0.4 mg/dL (ref 0.2–1.2)
Total Protein: 6.2 g/dL (ref 6.1–8.1)

## 2019-04-06 LAB — VITAMIN D 25 HYDROXY (VIT D DEFICIENCY, FRACTURES): Vit D, 25-Hydroxy: 80 ng/mL (ref 30–100)

## 2019-04-06 LAB — CBC WITH DIFFERENTIAL/PLATELET
Absolute Monocytes: 816 cells/uL (ref 200–950)
Basophils Absolute: 41 cells/uL (ref 0–200)
Basophils Relative: 0.4 %
Eosinophils Absolute: 133 cells/uL (ref 15–500)
Eosinophils Relative: 1.3 %
HCT: 40.1 % (ref 35.0–45.0)
Hemoglobin: 13.5 g/dL (ref 11.7–15.5)
Lymphs Abs: 2938 cells/uL (ref 850–3900)
MCH: 32.9 pg (ref 27.0–33.0)
MCHC: 33.7 g/dL (ref 32.0–36.0)
MCV: 97.8 fL (ref 80.0–100.0)
MPV: 14 fL — ABNORMAL HIGH (ref 7.5–12.5)
Monocytes Relative: 8 %
Neutro Abs: 6273 cells/uL (ref 1500–7800)
Neutrophils Relative %: 61.5 %
Platelets: 140 10*3/uL (ref 140–400)
RBC: 4.1 10*6/uL (ref 3.80–5.10)
RDW: 12.1 % (ref 11.0–15.0)
Total Lymphocyte: 28.8 %
WBC: 10.2 10*3/uL (ref 3.8–10.8)

## 2019-04-06 LAB — LIPID PANEL
Cholesterol: 128 mg/dL (ref <200)
HDL: 31 mg/dL — ABNORMAL LOW (ref >=50)
LDL Cholesterol (Calc): 74 mg/dL (calc)
Non-HDL Cholesterol (Calc): 97 mg/dL (calc) (ref <130)
Total CHOL/HDL Ratio: 4.1 (calc) (ref <5.0)
Triglycerides: 144 mg/dL (ref <150)

## 2019-04-06 LAB — TSH: TSH: 0.53 mIU/L

## 2019-04-06 LAB — MAGNESIUM: Magnesium: 2.1 mg/dL (ref 1.5–2.5)

## 2019-04-07 LAB — COPPER, SERUM: Copper: 105 ug/dL (ref 70–175)

## 2019-04-07 LAB — ZINC: Zinc: 79 ug/dL (ref 60–130)

## 2019-04-29 ENCOUNTER — Other Ambulatory Visit: Payer: Self-pay | Admitting: Physician Assistant

## 2019-04-29 DIAGNOSIS — E785 Hyperlipidemia, unspecified: Secondary | ICD-10-CM

## 2019-04-29 DIAGNOSIS — Z0001 Encounter for general adult medical examination with abnormal findings: Secondary | ICD-10-CM

## 2019-07-03 ENCOUNTER — Ambulatory Visit: Payer: BC Managed Care – PPO | Admitting: Physician Assistant

## 2019-07-03 ENCOUNTER — Other Ambulatory Visit: Payer: Self-pay

## 2019-07-03 ENCOUNTER — Encounter: Payer: Self-pay | Admitting: Physician Assistant

## 2019-07-03 ENCOUNTER — Ambulatory Visit (HOSPITAL_COMMUNITY)
Admission: RE | Admit: 2019-07-03 | Discharge: 2019-07-03 | Disposition: A | Discharge status: Home / Self Care | Payer: BC Managed Care – PPO | Source: Ambulatory Visit | Attending: Cardiology | Admitting: Cardiology

## 2019-07-03 ENCOUNTER — Encounter (HOSPITAL_COMMUNITY): Payer: Self-pay | Admitting: Cardiology

## 2019-07-03 VITALS — BP 124/78 | HR 65 | Temp 97.5°F | Wt 141.2 lb

## 2019-07-03 DIAGNOSIS — R062 Wheezing: Secondary | ICD-10-CM | POA: Diagnosis not present

## 2019-07-03 DIAGNOSIS — M7989 Other specified soft tissue disorders: Secondary | ICD-10-CM | POA: Insufficient documentation

## 2019-07-03 DIAGNOSIS — M79605 Pain in left leg: Secondary | ICD-10-CM | POA: Diagnosis not present

## 2019-07-03 MED ORDER — GABAPENTIN 300 MG PO CAPS: 300.0000 mg | ORAL_CAPSULE | Freq: Three times a day (TID) | ORAL | 2 refills | Status: DC

## 2019-07-03 NOTE — Patient Instructions (Signed)
Will get [place] of your leg  Will give you an inhaler to do once a day, wash your mouth afterwards, can cause yeast  Likely your leg pain is coming from nerves up your leg, we will refer you to ortho and give you gabapentin to pick up.   Can take the gabapentin 37m at night.   It can make you sleepy so we suggest trying it at night first and please plan to not drive or do anything strenuous.  Also please do not take this medication with alcohol.   Start out 1 pill at night before bed, can increase to 2 pills at night before bed. Please call the office if you have any side effects.   Can take 3 pills a day total however you would like  Some examples: - 1 breakfast, lunch, bedtime. - 1 at breakfast, 2 at bed time  Common side effects are sleepiness, concentration problems, dizziness, swelling.  Do not stop abruptly unless you have a reaction to it.    Go to the ER if any chest pain, shortness of breath, nausea, dizziness, severe HA, changes vision/speech    is now requiring appointments for testing, you should be able to request an appointment by going to [email or web address] by texting "COVID" to [phone].  You can also call [phone] more information about community testing.   There are also several urgent cares that are testing, the PCR send out is a better test than the rapid test.  The pt's will remain in the car and wear a mask when going for testing.The staff will come to the car to perform testing. The pt's will need to bring an ID. People are getting results in 24-72 hours.  During this time please quarantine you and your family.   [place]  [facility]    To stop home isolation IF you test positive, you will need all three of the statements below.  1) You need to be fever free for 3 days WITHOUT tylenol.  2) Your  symptoms such as cough, shortness of breath, diarrhea, etc need to improve.  3) It needs to be at least 10 days since your symptoms first appeared    After you stop home isolation, please continue to wear a mask in public and continue hand washing and contact precautions.   Things to do if you test positive: Please do vitamin C [id number] a day- stop if you have any worsening heart burn or diarrhea.   You can take tylenol for fevers above 101 if you feel uncomfortable. Remember a fever itself is not bad, the tylenol is more for your comfort. See the max of tylenol below.  Please do breathing exercises. Make sure that you are taking deep breaths and trying to walk around the room as much as possible.  Please lay "prone" or on your belly for up to 4 hours a day, you can split this up to 30 minute or 1 hour increments but this helps get oxygen to certain places of your lungs.  Please get on 81 mg of aspirin unless you are unable to tolerate this or have a contraindication.  Please pump your feet like your are pedaling a bike to prevent clots in your legs.   Push fluids with Gatorade or electrolyte replacement in addition to water, aim for 100-120 oz a day if no contraindications.  If  you have worsening shortness of breath, unable to complete full sentences, are panting, please contact the office immediately for call 911. Let them know you are being monitored for coronavirus.

## 2019-07-03 NOTE — Progress Notes (Signed)
Subjective:    Patient ID: Andrea Owens, female    DOB: 1975-12-18, 43 y.o.   MRN: [id number]  HPI 43 y.o. smoking WF presents with left leg pain started Thursday.  She was getting out of bed and had left foot pain across the ball of her foot, she had to walk on her heel that day, could not bear weight on ball of her feet. Now it is worse, going up her leg, started into her ankle, then going along the lateral left leg, burning/throbbing pain, today it is going up her left thigh. She is on ibuprofen, resting, elevating and there is no relief.   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   She has a history of bilateral knee pain. She has some bilateral lower back pain.  She has had chills with some throat soreness, right AB pain that has resolved.   Patient denies fever, hematuria, incontinence, weakness and saddle anesthesia   Blood pressure 124/78, pulse 65, temperature (!) 97.5 F (36.4 C), weight 141 lb 3.2 oz (64 kg), last menstrual period 03/15/2015, SpO2 97 %.  Medications   Current Outpatient Medications (Cardiovascular):  .  hydrochlorothiazide (HYDRODIURIL) 25 MG tablet, TAKE 1 TABLET BY MOUTH  DAILY .  metoprolol tartrate (LOPRESSOR) 50 MG tablet, TAKE 1 TABLET BY MOUTH  TWICE DAILY .  simvastatin (ZOCOR) 40 MG tablet, TAKE 1 TABLET BY MOUTH  DAILY  Current Outpatient Medications (Respiratory):  .  fluticasone (FLONASE) 50 MCG/ACT nasal spray, Place 2 sprays into both nostrils at bedtime. Marland Kitchen  levocetirizine (XYZAL) 5 MG tablet, Take 1 tablet daily for Allergies .  loratadine (CLARITIN) 10 MG tablet, Take 10 mg by mouth daily.  Current Outpatient Medications (Analgesics):  .  naproxen (NAPROSYN) 500 MG tablet, Take 1 tablet (500 mg total) by mouth 2 (two) times daily.  Current Outpatient Medications (Hematological):  Marland Kitchen  Cyanocobalamin (VITAMIN B 12 PO), Take by mouth daily.  Current  Outpatient Medications (Other):  Marland Kitchen  buPROPion (WELLBUTRIN XL) 150 MG 24 hr tablet, Take 1 tablet (150 mg total) by mouth every morning. .  cholecalciferol (VITAMIN D) 1000 UNITS tablet, Take 2,000 Units by mouth daily. .  cyclobenzaprine (FLEXERIL) 10 MG tablet, TAKE as needed at night for headache/neck .  famotidine (PEPCID) 40 MG tablet, Take 40 mg by mouth daily. .  Magnesium 250 MG TABS, Take 250 mg by mouth daily. .  traZODone (DESYREL) 50 MG tablet, 1/2-1 tablet for sleep  Problem list She has Hypertension; Hyperlipidemia; GERD (gastroesophageal reflux disease); Vitamin D deficiency; Gallbladder polyp; Migraine; Lower back pain; and Tobacco use disorder on their problem list.   Review of Systems  Constitutional: Positive for chills and fatigue. Negative for fever.  HENT: Negative.   Respiratory: Positive for wheezing. Negative for apnea, cough, choking, chest tightness, shortness of breath and stridor.   Cardiovascular: Positive for leg swelling. Negative for chest pain and palpitations.  Genitourinary: Negative.   Musculoskeletal: Positive for arthralgias, back pain, gait problem and myalgias. Negative for joint swelling, neck pain and neck stiffness.  Skin: Negative.  Negative for rash and wound.       Objective:   Physical Exam Constitutional:      General: She is not in acute distress.    Appearance: Normal appearance. She is not ill-appearing or diaphoretic.  HENT:     Head: Normocephalic and atraumatic.     Nose:  Nose normal.  Eyes:     Pupils: Pupils are equal, round, and reactive to light.  Cardiovascular:     Rate and Rhythm: Normal rate.     Pulses: Normal pulses.     Heart sounds: Normal heart sounds. No murmur.  Pulmonary:     Effort: Pulmonary effort is normal.     Breath sounds: Wheezing (right middle lobe) present.  Abdominal:     General: Abdomen is flat.     Palpations: Abdomen is soft.     Tenderness: There is no abdominal tenderness. There is no  right CVA tenderness or left CVA tenderness.  Musculoskeletal:        General: Tenderness present. No deformity or signs of injury.     Right lower leg: No edema.     Left lower leg: Edema present.     Comments: Slight swelling left leg compared to right with some calf tenderness and + homen, no erythema, warmth, rash. decreased sensation lateral left leg, negative straight leg raise, good pulse, good reflexes, slight decreased strength on left lower side possibly due to pain.   Skin:    General: Skin is warm and dry.     Capillary Refill: Capillary refill takes less than 2 seconds.     Findings: No bruising, erythema or rash.  Neurological:     General: No focal deficit present.     Mental Status: She is alert and oriented to person, place, and time.     Cranial Nerves: No cranial nerve deficit.     Sensory: Sensory deficit present.     Deep Tendon Reflexes: Reflexes normal.       Assessment & Plan:   Pain of left lower extremity + abnormal sensation left lateral leg, normal relfexes, some decreased strength, will refer to ortho for evaluation, give Neurontin.  -     Ambulatory referral to Orthopedic Surgery -     Discontinue: gabapentin (NEURONTIN) 300 MG capsule; Take 1 capsule (300 mg total) by mouth 3 (three) times daily. -     gabapentin (NEURONTIN) 300 MG capsule; Take 1 capsule (300 mg total) by mouth 3 (three) times daily.  Left leg swelling -     LE VENOUS; Future - clearly more swelling on the left, with possible family history, smoking, and wheezing will get [place]  Wheezing -     DG Chest 2 View; Future Suggest stop smoking, inhaler given, get CXR and COVID tested

## 2019-07-03 NOTE — Progress Notes (Signed)
Venous duplex negative for DVT.

## 2019-07-04 ENCOUNTER — Ambulatory Visit: Payer: BC Managed Care – PPO | Attending: Internal Medicine

## 2019-07-04 DIAGNOSIS — Z20822 Contact with and (suspected) exposure to covid-19: Secondary | ICD-10-CM

## 2019-07-04 DIAGNOSIS — Z20828 Contact with and (suspected) exposure to other viral communicable diseases: Secondary | ICD-10-CM | POA: Diagnosis not present

## 2019-07-04 NOTE — Progress Notes (Signed)
Assessment and Plan:   Essential hypertension -     CBC with Diff -     COMPLETE METABOLIC PANEL WITH GFR -     TSH - continue medications, DASH diet, exercise and monitor at home. Call if greater than 130/80.   Hyperlipidemia, unspecified hyperlipidemia type -     Lipid Profile check lipids decrease fatty foods increase activity.   Tobacco use disorder Smoking cessation-  instruction/counseling given, counseled patient on the dangers of tobacco use, advised patient to stop smoking, and reviewed strategies to maximize success, patient not ready to quit at this time.   Acute right-sided low back pain with right-sided sciatica No loss of bladder/bowel control, no saddle anesthesia, negative straight leg, has OV with ortho tomorrow     Continue diet and meds as discussed. Further disposition pending results of labs. Over 30 minutes of exam, counseling, chart review, and critical decision making was performed  Future Appointments  Date Time Provider [facility]  07/10/2019  8:20 AM Hilts, Legrand Como, MD [facility] None  10/11/2019  9:00 AM Vicie Mutters, PA-C [facility] None     HPI 43 y.o. female  presents for 6 month follow up on hypertension, cholesterol, prediabetes, and vitamin D deficiency.   She was seen recently for left leg pain, normal [place], given gabapentin but has not been able to pick up due to insurance, suppose to get it today and referred to ortho- has appointment in the AM. States pain has improved with not moving this past weekend, tylenol helps some.  Pain is worse left lateral leg, tight at her calf, and has burning sensation. Worse with movement/weight bearing.  COVID negative- no SOB, no cough.    Her blood pressure has been controlled at home, she is on HCTZ and metorprolol, today their BP is BP: 120/76  BMI is Body mass index is 23.5 kg/m., she is working on diet and exercise. Wt Readings from Last 3 Encounters:  07/09/19 143 lb 6.4 oz (65 kg)  07/03/19  141 lb 3.2 oz (64 kg)  04/05/19 140 lb (63.5 kg)     She does workout, she is walking. She denies chest pain, shortness of breath, dizziness.  She continues to smoke, about a 1/2 a pack a day. She has tried chantix and did not like the feeling.  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    She is on cholesterol medication and denies myalgias. Her cholesterol is at goal. The cholesterol last visit was:   Lab Results  Component Value Date   CHOL 128 04/05/2019   HDL 31 (L) 04/05/2019   LDLCALC 74 04/05/2019   TRIG 144 04/05/2019   CHOLHDL 4.1 04/05/2019    She has been working on diet and exercise for prediabetes, and denies paresthesia of the feet, polydipsia, polyuria and visual disturbances. Last A1C in the office was:  Lab Results  Component Value Date   HGBA1C 5.2 09/13/2018   Patient is on Vitamin D supplement.   Lab Results  Component Value Date   VD25OH 80 04/05/2019      Current Medications:     Current Outpatient Medications (Cardiovascular):  .  hydrochlorothiazide (HYDRODIURIL) 25 MG tablet, TAKE 1 TABLET BY MOUTH  DAILY .  metoprolol tartrate (LOPRESSOR) 50 MG tablet, TAKE 1 TABLET BY MOUTH  TWICE DAILY .  simvastatin (ZOCOR) 40 MG tablet, TAKE 1 TABLET BY MOUTH  DAILY  Current Outpatient Medications (Respiratory):  .  fluticasone (FLONASE) 50 MCG/ACT nasal spray, Place 2 sprays into both nostrils at bedtime. Marland Kitchen  levocetirizine (XYZAL) 5 MG tablet, Take 1 tablet daily for Allergies .  loratadine (CLARITIN) 10 MG tablet, Take 10 mg by mouth daily.  Current Outpatient Medications (Analgesics):  .  naproxen (NAPROSYN) 500 MG tablet, Take 1 tablet (500 mg total) by mouth 2 (two) times daily.  Current Outpatient Medications (Hematological):  Marland Kitchen  Cyanocobalamin (VITAMIN B 12 PO), Take by mouth daily.  Current Outpatient Medications (Other):  Marland Kitchen  buPROPion (WELLBUTRIN XL) 150 MG 24 hr tablet, Take 1 tablet (150 mg total)  by mouth every morning. .  cholecalciferol (VITAMIN D) 1000 UNITS tablet, Take 2,000 Units by mouth daily. .  cyclobenzaprine (FLEXERIL) 10 MG tablet, TAKE as needed at night for headache/neck .  famotidine (PEPCID) 40 MG tablet, Take 40 mg by mouth daily. Marland Kitchen  gabapentin (NEURONTIN) 300 MG capsule, Take 1 capsule (300 mg total) by mouth 3 (three) times daily. .  Magnesium 250 MG TABS, Take 250 mg by mouth daily. .  traZODone (DESYREL) 50 MG tablet, 1/2-1 tablet for sleep  Medical History:  Past Medical History:  Diagnosis Date  . Allergy   . Endometriosis   . GERD (gastroesophageal reflux disease)   . Hyperlipidemia   . Hypertension   . Migraine   . Prediabetes   . Vitamin D deficiency    Allergies:  Allergies  Allergen Reactions  . Penicillins     rash     Review of Systems:  Review of Systems  Constitutional: Negative.   HENT: Negative for congestion (on claritin), ear discharge, ear pain, nosebleeds and sore throat.        + TMJ, wears night guard  Respiratory: Negative.  Negative for stridor.   Cardiovascular: Negative.   Gastrointestinal: Negative.   Genitourinary: Negative.   Musculoskeletal: Positive for myalgias (left foot). Negative for back pain, falls, joint pain and neck pain.  Skin: Negative.   Neurological: Negative for dizziness, sensory change, seizures and headaches.  Psychiatric/Behavioral: Negative.     Family history- Review and unchanged Social history- Review and unchanged Physical Exam: BP 120/76   Pulse 78   Temp (!) 97.3 F (36.3 C)   Wt 143 lb 6.4 oz (65 kg)   LMP 03/15/2015   SpO2 98%   BMI 23.50 kg/m  Wt Readings from Last 3 Encounters:  07/09/19 143 lb 6.4 oz (65 kg)  07/03/19 141 lb 3.2 oz (64 kg)  04/05/19 140 lb (63.5 kg)   General Appearance: Well nourished, in no apparent distress. Eyes: PERRLA, EOMs, conjunctiva no swelling or erythema Sinuses: No Frontal/maxillary tenderness ENT/Mouth: Ext aud canals clear, TMs without  erythema, bulging. No erythema, swelling, or exudate on post pharynx.  Tonsils not swollen or erythematous. Hearing normal.  Neck: Supple, thyroid with small nodules on the left.  Respiratory: Respiratory effort normal, BS equal bilaterally without rales, rhonchi, wheezing or stridor.  Cardio: RRR with no MRGs. Brisk peripheral pulses without edema.  Abdomen: Soft, + BS, nontender, no guarding, rebound, hernias, masses. Lymphatics: Non tender without lymphadenopathy.  Musculoskeletal:  No warmth, erythema. decreased sensation lateral left leg, negative straight leg raise, good pulse, good reflexes, slight decreased strength on left lower side possibly due to pain, + antalgic gait.  Skin: dry without rashes, lesions, ecchymosis.  Neuro: Cranial nerves intact. Normal muscle tone, no cerebellar symptoms. Psych: Awake and oriented X 3, normal affect, Insight and Judgment appropriate.    Vicie Mutters, PA-C  8:52 AM [facility]

## 2019-07-06 LAB — NOVEL CORONAVIRUS, NAA: SARS-CoV-2, NAA: NOT DETECTED

## 2019-07-09 ENCOUNTER — Other Ambulatory Visit: Payer: Self-pay

## 2019-07-09 ENCOUNTER — Encounter: Payer: Self-pay | Admitting: Physician Assistant

## 2019-07-09 ENCOUNTER — Ambulatory Visit: Payer: BC Managed Care – PPO | Admitting: Physician Assistant

## 2019-07-09 VITALS — BP 120/76 | HR 78 | Temp 97.3°F | Wt 143.4 lb

## 2019-07-09 DIAGNOSIS — M5441 Lumbago with sciatica, right side: Secondary | ICD-10-CM | POA: Diagnosis not present

## 2019-07-09 DIAGNOSIS — F17200 Nicotine dependence, unspecified, uncomplicated: Secondary | ICD-10-CM

## 2019-07-09 DIAGNOSIS — E785 Hyperlipidemia, unspecified: Secondary | ICD-10-CM | POA: Diagnosis not present

## 2019-07-09 DIAGNOSIS — I10 Essential (primary) hypertension: Secondary | ICD-10-CM

## 2019-07-09 NOTE — Patient Instructions (Signed)
BACK PAIN  Go to the ER if you have any new weakness in your legs, have trouble controlling your urine or bowels, or have worsening pain.   MAXIMUM AMOUNT OF TYLENOL IN A DAY  You can take tylenol (592m) or tylenol arthritis (6521m with the meloxicam/antiinflammatories. The max you can take of tylenol a day is 300057maily, this is a max of 6 pills a day of the regular tyelnol (500m46mr a max of 4 a day of the tylenol arthritis (650mg63m long as no other medications you are taking contain tylenol.   Can take the gabapentin 300mg 52might.   It can make you sleepy so we suggest trying it at night first and please plan to not drive or do anything strenuous.  Also please do not take this medication with alcohol.   Start out 1 pill at night before bed, can increase to 2 pills at night before bed. Please call the office if you have any side effects.   Can take 3 pills a day total however you would like  Some examples: - 1 breakfast, lunch, bedtime. - 1 at breakfast, 2 at bed time  Common side effects are sleepiness, concentration problems, dizziness, swelling.  Do not stop abruptly unless you have a reaction to it.     Back pain Rehab Ask your health care provider which exercises are safe for you. Do exercises exactly as told by your health care provider and adjust them as directed. It is normal to feel mild stretching, pulling, tightness, or discomfort as you do these exercises, but you should stop right away if you feel sudden pain or your pain gets worse. Do not begin these exercises until told by your health care provider. Stretching and range of motion exercises These exercises warm up your muscles and joints and improve the movement and flexibility of your hips and your back. These exercises also help to relieve pain, numbness, and tingling. Exercise A: Sciatic nerve glide 1. Sit in a chair with your head facing down toward your chest. Place your hands behind your back. Let your  shoulders slump forward. 2. Slowly straighten one of your knees while you tilt your head back as if you are looking toward the ceiling. Only straighten your leg as far as you can without making your symptoms worse. 3. Hold for __________ seconds. 4. Slowly return to the starting position. 5. Repeat with your other leg. Repeat __________ times. Complete this exercise __________ times a day. Exercise B: Knee to chest with hip adduction and internal rotation  1. Lie on your back on a firm surface with both legs straight. 2. Bend one of your knees and move it up toward your chest until you feel a gentle stretch in your lower back and buttock. Then, move your knee toward the shoulder that is on the opposite side from your leg. ? Hold your leg in this position by holding onto the front of your knee. 3. Hold for __________ seconds. 4. Slowly return to the starting position. 5. Repeat with your other leg. Repeat __________ times. Complete this exercise __________ times a day. Exercise C: Prone extension on elbows  1. Lie on your abdomen on a firm surface. A bed may be too soft for this exercise. 2. Prop yourself up on your elbows. 3. Use your arms to help lift your chest up until you feel a gentle stretch in your abdomen and your lower back. ? This will place some of your body  weight on your elbows. If this is uncomfortable, try stacking pillows under your chest. ? Your hips should stay down, against the surface that you are lying on. Keep your hip and back muscles relaxed. 4. Hold for __________ seconds. 5. Slowly relax your upper body and return to the starting position. Repeat __________ times. Complete this exercise __________ times a day. Strengthening exercises These exercises build strength and endurance in your back. Endurance is the ability to use your muscles for a long time, even after they get tired. Exercise D: Pelvic tilt 1. Lie on your back on a firm surface. Bend your knees and  keep your feet flat. 2. Tense your abdominal muscles. Tip your pelvis up toward the ceiling and flatten your lower back into the floor. ? To help with this exercise, you may place a small towel under your lower back and try to push your back into the towel. 3. Hold for __________ seconds. 4. Let your muscles relax completely before you repeat this exercise. Repeat __________ times. Complete this exercise __________ times a day. Exercise E: Alternating arm and leg raises  1. Get on your hands and knees on a firm surface. If you are on a hard floor, you may want to use padding to cushion your knees, such as an exercise mat. 2. Line up your arms and legs. Your hands should be below your shoulders, and your knees should be below your hips. 3. Lift your left leg behind you. At the same time, raise your right arm and straighten it in front of you. ? Do not lift your leg higher than your hip. ? Do not lift your arm higher than your shoulder. ? Keep your abdominal and back muscles tight. ? Keep your hips facing the ground. ? Do not arch your back. ? Keep your balance carefully, and do not hold your breath. 4. Hold for __________ seconds. 5. Slowly return to the starting position and repeat with your right leg and your left arm. Repeat __________ times. Complete this exercise __________ times a day. Posture and body mechanics  Body mechanics refers to the movements and positions of your body while you do your daily activities. Posture is part of body mechanics. Good posture and healthy body mechanics can help to relieve stress in your body's tissues and joints. Good posture means that your spine is in its natural S-curve position (your spine is neutral), your shoulders are pulled back slightly, and your head is not tipped forward. The following are general guidelines for applying improved posture and body mechanics to your everyday activities. Standing   When standing, keep your spine neutral and  your feet about hip-width apart. Keep a slight bend in your knees. Your ears, shoulders, and hips should line up.  When you do a task in which you stand in one place for a long time, place one foot up on a stable object that is 2-4 inches (5-10 cm) high, such as a footstool. This helps keep your spine neutral. Sitting   When sitting, keep your spine neutral and keep your feet flat on the floor. Use a footrest, if necessary, and keep your thighs parallel to the floor. Avoid rounding your shoulders, and avoid tilting your head forward.  When working at a desk or a computer, keep your desk at a height where your hands are slightly lower than your elbows. Slide your chair under your desk so you are close enough to maintain good posture.  When working at a computer,  place your monitor at a height where you are looking straight ahead and you do not have to tilt your head forward or downward to look at the screen. Resting   When lying down and resting, avoid positions that are most painful for you.  If you have pain with activities such as sitting, bending, stooping, or squatting (flexion-based activities), lie in a position in which your body does not bend very much. For example, avoid curling up on your side with your arms and knees near your chest (fetal position).  If you have pain with activities such as standing for a long time or reaching with your arms (extension-based activities), lie with your spine in a neutral position and bend your knees slightly. Try the following positions: ? Lying on your side with a pillow between your knees. ? Lying on your back with a pillow under your knees. Lifting   When lifting objects, keep your feet at least shoulder-width apart and tighten your abdominal muscles.  Bend your knees and hips and keep your spine neutral. It is important to lift using the strength of your legs, not your back. Do not lock your knees straight out.  Always ask for help to lift  heavy or awkward objects. This information is not intended to replace advice given to you by your health care provider. Make sure you discuss any questions you have with your health care provider. Document Released: 06/28/2005 Document Revised: 03/04/2016 Document Reviewed: 03/14/2015 Elsevier Interactive Patient Education  [employer].

## 2019-07-10 ENCOUNTER — Encounter: Payer: Self-pay | Admitting: Family Medicine

## 2019-07-10 ENCOUNTER — Ambulatory Visit: Payer: BC Managed Care – PPO | Admitting: Family Medicine

## 2019-07-10 DIAGNOSIS — M79605 Pain in left leg: Secondary | ICD-10-CM

## 2019-07-10 LAB — COMPLETE METABOLIC PANEL WITH GFR
AG Ratio: 2.1 (calc) (ref 1.0–2.5)
ALT: 26 U/L (ref 6–29)
AST: 22 U/L (ref 10–35)
Albumin: 4.7 g/dL (ref 3.6–5.1)
Alkaline phosphatase (APISO): 56 U/L (ref 31–125)
BUN: 11 mg/dL (ref 7–25)
CO2: 32 mmol/L (ref 20–32)
Calcium: 9.8 mg/dL (ref 8.6–10.2)
Chloride: 106 mmol/L (ref 98–110)
Creat: 0.64 mg/dL (ref 0.50–1.10)
GFR, Est African American: 125 mL/min/{1.73_m2} (ref >=60)
GFR, Est Non African American: 108 mL/min/{1.73_m2} (ref >=60)
Globulin: 2.2 g/dL (calc) (ref 1.9–3.7)
Glucose, Bld: 85 mg/dL (ref 65–99)
Potassium: 4 mmol/L (ref 3.5–5.3)
Sodium: 145 mmol/L (ref 135–146)
Total Bilirubin: 0.4 mg/dL (ref 0.2–1.2)
Total Protein: 6.9 g/dL (ref 6.1–8.1)

## 2019-07-10 LAB — CBC WITH DIFFERENTIAL/PLATELET
Absolute Monocytes: 676 cells/uL (ref 200–950)
Basophils Absolute: 59 cells/uL (ref 0–200)
Basophils Relative: 0.6 %
Eosinophils Absolute: 98 cells/uL (ref 15–500)
Eosinophils Relative: 1 %
HCT: 40.1 % (ref 35.0–45.0)
Hemoglobin: 13.5 g/dL (ref 11.7–15.5)
Lymphs Abs: 3205 cells/uL (ref 850–3900)
MCH: 32.4 pg (ref 27.0–33.0)
MCHC: 33.7 g/dL (ref 32.0–36.0)
MCV: 96.2 fL (ref 80.0–100.0)
MPV: 14 fL — ABNORMAL HIGH (ref 7.5–12.5)
Monocytes Relative: 6.9 %
Neutro Abs: 5762 cells/uL (ref 1500–7800)
Neutrophils Relative %: 58.8 %
Platelets: 139 10*3/uL — ABNORMAL LOW (ref 140–400)
RBC: 4.17 10*6/uL (ref 3.80–5.10)
RDW: 11.8 % (ref 11.0–15.0)
Total Lymphocyte: 32.7 %
WBC: 9.8 10*3/uL (ref 3.8–10.8)

## 2019-07-10 LAB — LIPID PANEL
Cholesterol: 132 mg/dL (ref <200)
HDL: 34 mg/dL — ABNORMAL LOW (ref >=50)
LDL Cholesterol (Calc): 81 mg/dL (calc)
Non-HDL Cholesterol (Calc): 98 mg/dL (calc) (ref <130)
Total CHOL/HDL Ratio: 3.9 (calc) (ref <5.0)
Triglycerides: 86 mg/dL (ref <150)

## 2019-07-10 LAB — TSH: TSH: 1.05 mIU/L

## 2019-07-10 MED ORDER — GABAPENTIN 100 MG PO CAPS: ORAL_CAPSULE | ORAL | 3 refills | Status: DC

## 2019-07-10 MED ORDER — METHYLPREDNISOLONE 4 MG PO TBPK: ORAL_TABLET | ORAL | 0 refills | Status: DC

## 2019-07-10 NOTE — Progress Notes (Signed)
Office Visit Note   Patient: Andrea Owens           Date of Birth: 1976-03-16           MRN: [id number] Visit Date: 07/10/2019 Requested by: Vicie Mutters, PA-C [address] [place],  [place] [address] PCP: Unk Pinto, MD  Subjective: Chief Complaint  Patient presents with  . Left Lower Leg - Pain    Pain from foot up to knee. Woke up approximately 2 weeks ago with pain in the foot (could not bear weight) - has slowly moved up the leg. Tingling & burning in foot and calf. Had neg venous doppler last week.    HPI: She is here with left lower leg pain.  About 2 weeks ago she woke up and as soon as she stepped down, felt immediate pain across the bottom of her toes.  She has been walking with a limp since then.  The pain is a burning pain and is now radiating up the lateral aspect of her leg toward her knee.  She went to her PCP who ordered Doppler studies which were negative.  She has not had any period of immobilization, and has not done any unusual activities to account for her pain.  She has chronic intermittent pain in her midline low back, but it is no worse now than usual.  She is not having sciatica symptoms.  Tylenol is not helping with her pain.  It is now starting to keep her awake at night.  Denies any bowel or bladder dysfunction, fevers or chills.  No personal history of gout.  No change in her chronic medications for hypertension and hyperlipidemia.              ROS:   All other systems were reviewed and are negative.  Objective: Vital Signs: LMP 03/15/2015   Physical Exam:  General:  Alert and oriented, in no acute distress. Pulm:  Breathing unlabored. Psy:  Normal mood, congruent affect. Skin: No rash or erythema. Left leg: Her low back is slightly tender in the midline lumbosacral area.  Negative straight leg raise, 5/5 lower extremity strength and 2+ DTRs.  Slight tenderness near the common peroneal nerve at the fibular head.  Moderately tender on  the plantar aspect of the first MTP.  Light touch sensation is slightly diminished on the lateral foot.  Imaging: None today  Assessment & Plan: 1.  Left leg pain, etiology uncertain.  Suspect nerve irritation. -Medrol Dosepak, gabapentin at night.  If not improving in a week or 2, she will contact me and I will order nerve conduction studies of her left leg.     Procedures: No procedures performed  No notes on file     PMFS History: Patient Active Problem List   Diagnosis Date Noted  . Tobacco use disorder 09/08/2015  . Lower back pain 09/04/2014  . Migraine   . Gallbladder polyp 09/11/2013  . Hypertension   . Hyperlipidemia   . GERD (gastroesophageal reflux disease)   . Vitamin D deficiency    Past Medical History:  Diagnosis Date  . Allergy   . Endometriosis   . GERD (gastroesophageal reflux disease)   . Hyperlipidemia   . Hypertension   . Migraine   . Prediabetes   . Vitamin D deficiency     Family History  Problem Relation Age of Onset  . Hyperlipidemia Mother   . Irritable bowel syndrome Mother   . Heart disease Father   .  Hyperlipidemia Father   . Hypertension Father   . Breast cancer Maternal Aunt        [employer]  . Prostate cancer Maternal Grandfather   . Colon cancer Neg Hx   . Esophageal cancer Neg Hx   . Stomach cancer Neg Hx   . Rectal cancer Neg Hx     Past Surgical History:  Procedure Laterality Date  . ABDOMINAL HYSTERECTOMY  03/2015   Dr. Corinna Capra  . REFRACTIVE SURGERY  2004   Social History   Occupational History  . Occupation: Freight forwarder in [employer]: [employer]  Tobacco Use  . Smoking status: Current Every Day Smoker    Packs/day: 0.50    Years: 20.00    Pack years: 10.00  . Smokeless tobacco: Never Used  Substance and Sexual Activity  . Alcohol use: No  . Drug use: No  . Sexual activity: Not on file    Comment: on period

## 2019-07-10 NOTE — Patient Instructions (Signed)
   Knee:  Chondromalacia patella  Glucosamine Sulfate 1,000 mg twice daily  Turmeric 500 mg twice daily (anti-inflammatory)  Avoid sugar  Straight leg raises to strengthen quads.

## 2019-07-11 ENCOUNTER — Other Ambulatory Visit: Payer: Self-pay | Admitting: Physician Assistant

## 2019-07-11 DIAGNOSIS — F4321 Adjustment disorder with depressed mood: Secondary | ICD-10-CM

## 2019-07-19 ENCOUNTER — Other Ambulatory Visit: Payer: Self-pay | Admitting: Internal Medicine

## 2019-07-19 DIAGNOSIS — I10 Essential (primary) hypertension: Secondary | ICD-10-CM

## 2019-07-26 ENCOUNTER — Other Ambulatory Visit: Payer: Self-pay | Admitting: Physician Assistant

## 2019-07-26 DIAGNOSIS — F4321 Adjustment disorder with depressed mood: Secondary | ICD-10-CM

## 2019-09-18 ENCOUNTER — Encounter: Payer: Self-pay | Admitting: Physician Assistant

## 2019-09-30 ENCOUNTER — Other Ambulatory Visit: Payer: Self-pay | Admitting: Internal Medicine

## 2019-09-30 DIAGNOSIS — Z9109 Other allergy status, other than to drugs and biological substances: Secondary | ICD-10-CM

## 2019-09-30 DIAGNOSIS — Z0001 Encounter for general adult medical examination with abnormal findings: Secondary | ICD-10-CM

## 2019-09-30 MED ORDER — LEVOCETIRIZINE DIHYDROCHLORIDE 5 MG PO TABS: ORAL_TABLET | ORAL | 3 refills | Status: DC

## 2019-10-09 DIAGNOSIS — Z79899 Other long term (current) drug therapy: Secondary | ICD-10-CM | POA: Insufficient documentation

## 2019-10-09 NOTE — Progress Notes (Signed)
Complete Physical  Assessment and Plan: Essential hypertension - continue medications, DASH diet, exercise and monitor at home. Call if greater than 130/80.  - Urinalysis, Routine w reflex microscopic (not at [facility]) - Microalbumin / creatinine urine ratio  Hyperlipidemia -continue medications, check lipids, decrease fatty foods, increase activity.  May want to switch meds and make goal 70 with smoking history and father with MI at 61.  May get cardiac calcium score at 30 Long discussion - will get lipoprotein a  Vitamin D deficiency - VITAMIN D 25 Hydroxy (Vit-D Deficiency, Fractures)  Gastroesophageal reflux disease with esophagitis Diet controlled  Gallbladder polyp Low fat diet, monitor   Other migraine without status migrainosus, not intractable Better s/p hysterectomy   Medication management - Magnesium   Encounter for general adult medical examination with abnormal findings   Tobacco use disorder Smoking cessation instruction/counseling given:  counseled patient on the dangers of tobacco use, advised patient to stop smoking, and reviewed strategies to maximize success  Get CXR  Screening for diabetes mellitus -     Hemoglobin A1c  Medication management -     Magnesium   Discussed med's effects and SE's. Screening labs and tests as requested with regular follow-up as recommended.  HPI Patient presents for complete physical.    Husband passed 01/2018 suddenly, she goes to church has good support.   She has been having lower back pain, saw Dr. Junius Roads, she is on gabapentin and took medrol dose pack, pain has improved.   Patient's blood pressure has been controlled at home, she is on 35m metoprolol BID and HCTZ 252mBID. BP: 120/76  She continues to smoke, started 1998, 1/2 pack a day or less. Current smoker Smoked 1/2 packs per day for x 1998 does not qualify for low dose CT screening Last imaging: 2012 Has tried chantix: No we have talked about it  COPD:  No Smoker's cough: No Family history of heart disease: Yes- father had MI, silent MI, at 5669 Dentist: yes  BMI is Body mass index is 23.56 kg/m., she is working on diet and exercise. Wt Readings from Last 3 Encounters:  10/11/19 141 lb 9.6 oz (64.2 kg)  07/09/19 143 lb 6.4 oz (65 kg)  07/03/19 141 lb 3.2 oz (64 kg)    Patient denies chest pain, shortness of breath, dizziness.  Lab Results  Component Value Date   CREATININE 0.64 07/09/2019   BUN 11 07/09/2019   NA 145 07/09/2019   K 4.0 07/09/2019   CL 106 07/09/2019   CO2 32 07/09/2019   She continue to have tingling bilateral hands and feet. She had normal TSH 03/2016, normal iron, ferritin, urine, CBC, HIV, ANA, antiDNA, CPK. She has had normal MRI.   Patient's cholesterol is controlled on zocor and denies myalgias. The patient's cholesterol last visit was Lab Results  Component Value Date   CHOL 132 07/09/2019   HDL 34 (L) 07/09/2019   LDLCALC 81 07/09/2019   TRIG 86 07/09/2019   CHOLHDL 3.9 07/09/2019   The patient has been working on diet and exercise for prediabetes, denies changes in vision, polys, and paresthesias. Last A1C in office  Lab Results  Component Value Date   HGBA1C 5.2 09/13/2018   Thrombocytopenia history Lab Results  Component Value Date   WBC 9.8 07/09/2019   HGB 13.5 07/09/2019   HCT 40.1 07/09/2019   MCV 96.2 07/09/2019   PLT 139 (L) 07/09/2019   She had EGD and was treated for H pylori  by Dr. Hilarie Fredrickson in 2015.  She is on vitamin D Lab Results  Component Value Date   VD25OH 13 04/05/2019   Current Medications:  Current Outpatient Medications on File Prior to Visit  Medication Sig Dispense Refill  . acetaminophen (TYLENOL) 500 MG tablet Take 1,000 mg by mouth every 6 (six) hours as needed.    Marland Kitchen buPROPion (WELLBUTRIN XL) 150 MG 24 hr tablet TAKE 1 TABLET BY MOUTH  EVERY MORNING 90 tablet 3  . cholecalciferol (VITAMIN D) 1000 UNITS tablet Take 2,000 Units by mouth daily.    .  Cyanocobalamin (VITAMIN B 12 PO) Take by mouth daily.    . cyclobenzaprine (FLEXERIL) 10 MG tablet TAKE as needed at night for headache/neck 30 tablet 0  . famotidine (PEPCID) 40 MG tablet Take 40 mg by mouth daily.    . fluticasone (FLONASE) 50 MCG/ACT nasal spray Place 2 sprays into both nostrils at bedtime. 16 g 1  . gabapentin (NEURONTIN) 100 MG capsule 1 PO q HS, may increase to 3 PO q HS if needed 90 capsule 3  . hydrochlorothiazide (HYDRODIURIL) 25 MG tablet TAKE 1 TABLET BY MOUTH  DAILY 90 tablet 3  . levocetirizine (XYZAL) 5 MG tablet Take 1 tablet daily for Allergies 90 tablet 3  . loratadine (CLARITIN) 10 MG tablet Take 10 mg by mouth daily.    . Magnesium 250 MG TABS Take 250 mg by mouth daily.    . metoprolol tartrate (LOPRESSOR) 50 MG tablet TAKE 1 TABLET BY MOUTH  TWICE DAILY 180 tablet 3  . simvastatin (ZOCOR) 40 MG tablet TAKE 1 TABLET BY MOUTH  DAILY 90 tablet 3  . traZODone (DESYREL) 50 MG tablet TAKE 1/2 TO 1 TABLET BY  MOUTH EVERY NIGHT AT  BEDTIME FOR SLEEP (Patient taking differently: at bedtime as needed. TAKE 1/2 TO 1 TABLET BY  MOUTH EVERY NIGHT AT  BEDTIME FOR SLEEP) 90 tablet 3  . naproxen (NAPROSYN) 500 MG tablet Take 1 tablet (500 mg total) by mouth 2 (two) times daily. (Patient not taking: Reported on 10/11/2019) 30 tablet 0   No current facility-administered medications on file prior to visit.   Health Maintenance: Immunization History  Administered Date(s) Administered  . Tdap 07/17/2012   Tetanus: 07/2012 Pneumovax: N/A Prevnar 13 N/A Flu vaccine: declines  Zostavax: N/A  Patient's last menstrual period was 03/15/2015. s/p hysterectomy Pap: 2016 Dr. Corinna Capra s/p hysterectomy in 2016 3D MGM:  06/2017 category C DEXA: N/A  Colonoscopy:N/A  EGD: 09/2013 CXR 2014 MRI brain 09/2016 normal  Patient Care Team: Unk Pinto, MD as PCP - General (Internal Medicine) Pyrtle, Lajuan Lines, MD as Consulting Physician (Gastroenterology) Louretta Shorten, MD as Consulting  Physician (Obstetrics and Gynecology)  Medical History:  Past Medical History:  Diagnosis Date  . Allergy   . Endometriosis   . GERD (gastroesophageal reflux disease)   . Hyperlipidemia   . Hypertension   . Migraine   . Prediabetes   . Vitamin D deficiency    Allergies Allergies  Allergen Reactions  . Penicillins     rash    SURGICAL HISTORY She  has a past surgical history that includes Refractive surgery (2004) and Abdominal hysterectomy (03/2015). FAMILY HISTORY Her family history includes Breast cancer in her maternal aunt; Heart disease in her father; Hyperlipidemia in her father and mother; Hypertension in her father; Irritable bowel syndrome in her mother; Prostate cancer in her maternal grandfather. SOCIAL HISTORY She  reports that she has been smoking cigarettes. She has  a 10.00 pack-year smoking history. She has never used smokeless tobacco. She reports that she does not drink alcohol or use drugs.   ROS Review of Systems  Constitutional: Negative for chills, diaphoresis, fever, malaise/fatigue and weight loss.  HENT: Negative for congestion, ear discharge, ear pain, hearing loss, nosebleeds, sinus pain, sore throat and tinnitus.        + TMJ, wears night guard  Eyes: Negative.  Negative for blurred vision and double vision.  Respiratory: Negative.  Negative for cough, hemoptysis, sputum production, shortness of breath, wheezing and stridor.   Cardiovascular: Negative for chest pain, palpitations, orthopnea, claudication, leg swelling and PND.  Gastrointestinal: Negative.  Negative for constipation, diarrhea and heartburn.  Genitourinary: Negative.  Negative for frequency.       Has white nodule on left labia  Musculoskeletal: Negative for back pain, falls, joint pain, myalgias and neck pain.  Skin: Negative.  Negative for itching and rash.  Neurological: Positive for dizziness (intermittent, last 30 seconds. with tremor occ bilateral) and tingling (bilateral hands  and feet, worse bilateral hands, states had trouble lifting right foot the other day). Negative for tremors, sensory change, speech change, focal weakness, seizures, loss of consciousness, weakness and headaches.  Endo/Heme/Allergies: Negative.   Psychiatric/Behavioral: Negative for depression, hallucinations, memory loss, substance abuse and suicidal ideas. The patient is nervous/anxious and has insomnia.    Physical Exam: Estimated body mass index is 23.56 kg/m as calculated from the following:   Height as of this encounter: 5' 5"  (1.651 m).   Weight as of this encounter: 141 lb 9.6 oz (64.2 kg). Vitals:   10/11/19 0857  BP: 120/76  Pulse: 65  Temp: (!) 97.5 F (36.4 C)  SpO2: 99%   General Appearance: Well nourished, in no apparent distress. Eyes: PERRLA, EOMs, conjunctiva no swelling or erythema, normal fundi and vessels. Sinuses: No Frontal/maxillary tenderness ENT/Mouth: Ext aud canals clear, normal light reflex with TMs without erythema, bulging.  Good dentition. No erythema, swelling, or exudate on post pharynx. Tonsils not swollen or erythematous. Hearing normal.  Neck: Supple, thyroid normal. No bruits Respiratory: Respiratory effort normal, BS equal bilaterally without rales, rhonchi, wheezing or stridor. Cardio: RRR without murmurs, rubs or gallops. Brisk peripheral pulses without edema.  Chest: symmetric, with normal excursions and percussion. Breasts: defer OB/GYN Abdomen: Soft, +BS, nontender no guarding, rebound, hernias, masses, or organomegaly. .  Lymphatics: Non tender without lymphadenopathy.  Genitourinary: defer Musculoskeletal: Full ROM all peripheral extremities,5/5 strength, and normal gait. Skin: Warm, dry without rashes, lesions, ecchymosis.  Neuro: Cranial nerves intact, reflexes equal bilaterally. Normal muscle tone, no cerebellar symptoms. Sensation intact.  Psych: Awake and oriented X 3, normal affect, Insight and Judgment appropriate.   EKG: WNL, no  ST changes    Vicie Mutters 9:20 AM

## 2019-10-11 ENCOUNTER — Ambulatory Visit: Payer: BC Managed Care – PPO | Admitting: Physician Assistant

## 2019-10-11 ENCOUNTER — Ambulatory Visit: Payer: Self-pay | Attending: Internal Medicine

## 2019-10-11 ENCOUNTER — Encounter: Payer: Self-pay | Admitting: Physician Assistant

## 2019-10-11 ENCOUNTER — Other Ambulatory Visit: Payer: Self-pay

## 2019-10-11 VITALS — BP 120/76 | HR 65 | Temp 97.5°F | Ht 65.0 in | Wt 141.6 lb

## 2019-10-11 DIAGNOSIS — Z79899 Other long term (current) drug therapy: Secondary | ICD-10-CM | POA: Diagnosis not present

## 2019-10-11 DIAGNOSIS — E559 Vitamin D deficiency, unspecified: Secondary | ICD-10-CM | POA: Diagnosis not present

## 2019-10-11 DIAGNOSIS — Z0000 Encounter for general adult medical examination without abnormal findings: Secondary | ICD-10-CM | POA: Diagnosis not present

## 2019-10-11 DIAGNOSIS — K2100 Gastro-esophageal reflux disease with esophagitis, without bleeding: Secondary | ICD-10-CM

## 2019-10-11 DIAGNOSIS — Z1389 Encounter for screening for other disorder: Secondary | ICD-10-CM

## 2019-10-11 DIAGNOSIS — Z0001 Encounter for general adult medical examination with abnormal findings: Secondary | ICD-10-CM

## 2019-10-11 DIAGNOSIS — Z131 Encounter for screening for diabetes mellitus: Secondary | ICD-10-CM | POA: Diagnosis not present

## 2019-10-11 DIAGNOSIS — G43809 Other migraine, not intractable, without status migrainosus: Secondary | ICD-10-CM

## 2019-10-11 DIAGNOSIS — M5441 Lumbago with sciatica, right side: Secondary | ICD-10-CM

## 2019-10-11 DIAGNOSIS — F17200 Nicotine dependence, unspecified, uncomplicated: Secondary | ICD-10-CM

## 2019-10-11 DIAGNOSIS — Z23 Encounter for immunization: Secondary | ICD-10-CM

## 2019-10-11 DIAGNOSIS — Z8249 Family history of ischemic heart disease and other diseases of the circulatory system: Secondary | ICD-10-CM

## 2019-10-11 DIAGNOSIS — Z1329 Encounter for screening for other suspected endocrine disorder: Secondary | ICD-10-CM | POA: Diagnosis not present

## 2019-10-11 DIAGNOSIS — I10 Essential (primary) hypertension: Secondary | ICD-10-CM

## 2019-10-11 DIAGNOSIS — E785 Hyperlipidemia, unspecified: Secondary | ICD-10-CM

## 2019-10-11 DIAGNOSIS — Z13220 Encounter for screening for lipoid disorders: Secondary | ICD-10-CM

## 2019-10-11 DIAGNOSIS — K824 Cholesterolosis of gallbladder: Secondary | ICD-10-CM

## 2019-10-11 NOTE — Progress Notes (Signed)
   Covid-19 Vaccination Clinic  Name:  Prentiss Hammett    MRN: [id number] DOB: 08-21-1975  10/11/2019  Ms. Sambrano was observed post Covid-19 immunization for 15 minutes without incident. She was provided with Vaccine Information Sheet and instruction to access the V-Safe system.   Ms. Sassaman was instructed to call 911 with any severe reactions post vaccine: Marland Kitchen Difficulty breathing  . Swelling of face and throat  . A fast heartbeat  . A bad rash all over body  . Dizziness and weakness   Immunizations Administered    Name Date Dose VIS Date Route   Pfizer COVID-19 Vaccine 10/11/2019 11:49 AM 0.3 mL 06/22/2019 Intramuscular   Manufacturer: [place]   Lot: [id number]   [place]: [id number]

## 2019-10-11 NOTE — Patient Instructions (Addendum)
HOW TO SCHEDULE A MAMMOGRAM  [facility]  7 a.m.-6:30 p.m., Monday 7 a.m.-5 p.m., Tuesday-Friday Schedule an appointment by calling [phone].  OR PLEASE GO TO DR. Gregor Hams OFFICE FOR MAMMOGRAM CATEGORY C DENSITY PLEASE GET THIS DONE.    INFORMATION ABOUT YOUR XRAY  Can walk into [address] for an Insurance account manager. They will have the order and take you back. You do not any paper work, I should get the result back today or tomorrow. This order is good for a year.  Can call [phone] to schedule an appointment if you wish.   Please go to the ER if you have any severe AB pain, unable to hold down food/water, blood in stool or vomit, chest pain, shortness of breath, or any worsening symptoms.   [place]  [employer]  [phone] for more information or for a free program for smoking cessation help.   You can call QUIT SMART 1-800-QUIT-NOW for free nicotine patches or replacement therapy- if they are out- keep calling  [facility] Can call for smoking cessation classes, [phone]  If you have a smart phone, please look up Smoke Free app, this will help you stay on track and give you information about money you have saved, life that you have gained back and a ton of more information.   We are giving you chantix for smoking cessation. You can do it! And we are here to help! You may have heard some scary side effects about chantix, the three most common I hear about are nausea, crazy dreams and depression.  However, I like for my patients to try to stay on 1/2 a tablet twice a day rather than one tablet twice a day as normally prescribed. This helps decrease the chances of side effects and helps save money by making a one month prescription last two months  Please start the prescription this way:  Start 1/2 tablet by mouth once daily after food with a full glass of water for 3 days Then do 1/2 tablet  by mouth twice daily for 4 days. During this first week you can smoke, but try to stop after this week.  At this point we have several options: 1) continue on 1/2 tablet twice a day- which I encourage you to do. You can stay on this dose the rest of the time on the medication or if you still feel the need to smoke you can do one of the two options below. 2) do one tablet in the morning and 1/2 in the evening which helps decrease dreams. 3) do one tablet twice a day.   What if I miss a dose? If you miss a dose, take it as soon as you can. If it is almost time for your next dose, take only that dose. Do not take double or extra doses.  What should I watch for while using this medicine? Visit your doctor or health care professional for regular check ups. Ask for ongoing advice and encouragement from your doctor or healthcare professional, friends, and family to help you quit. If you smoke while on this medication, quit again  Your mouth may get dry. Chewing sugarless gum or hard candy, and drinking plenty of water may help. Contact your doctor if the problem does not go away or is severe.  You may get drowsy or dizzy. Do not drive, use machinery, or do anything that needs mental alertness until you know  how this medicine affects you. Do not stand or sit up quickly, especially if you are an older patient.   The use of this medicine may increase the chance of suicidal thoughts or actions. Pay special attention to how you are responding while on this medicine. Any worsening of mood, or thoughts of suicide or dying should be reported to your health care professional right away.  ADVANTAGES OF QUITTING SMOKING  Within 20 minutes, blood pressure decreases. Your pulse is at normal level.  After 8 hours, carbon monoxide levels in the blood return to normal. Your oxygen level increases.  After 24 hours, the chance of having a heart attack starts to decrease. Your breath, hair, and body stop smelling like  smoke.  After 48 hours, damaged nerve endings begin to recover. Your sense of taste and smell improve.  After 72 hours, the body is virtually free of nicotine. Your bronchial tubes relax and breathing becomes easier.  After 2 to 12 weeks, lungs can hold more air. Exercise becomes easier and circulation improves.  After 1 year, the risk of coronary heart disease is cut in half.  After 5 years, the risk of stroke falls to the same as a nonsmoker.  After 10 years, the risk of lung cancer is cut in half and the risk of other cancers decreases significantly.  After 15 years, the risk of coronary heart disease drops, usually to the level of a nonsmoker.  You will have extra money to spend on things other than cigarettes.  Know what a healthy weight is for you (roughly BMI <25) and aim to maintain this  Aim for 7+ servings of fruits and vegetables daily  65-80+ fluid ounces of water or unsweet tea for healthy kidneys  Limit to max 1 drink of alcohol per day; avoid smoking/tobacco  Limit animal fats in diet for cholesterol and heart health - choose grass fed whenever available  Avoid highly processed foods, and foods high in saturated/trans fats  Aim for low stress - take time to unwind and care for your mental health  Aim for 150 min of moderate intensity exercise weekly for heart health, and weights twice weekly for bone health  Aim for 7-9 hours of sleep daily   Pelvic Pain, Female Pelvic pain is pain in your lower abdomen, below your belly button and between your hips. The pain may start suddenly (be acute), keep coming back (be recurring), or last a long time (become chronic). Pelvic pain that lasts longer than 6 months is considered chronic. Pelvic pain may affect your:  Reproductive organs.  Urinary system.  Digestive tract.  Musculoskeletal system. There are many potential causes of pelvic pain. Sometimes, the pain can be a result of digestive or urinary conditions,  strained muscles or ligaments, or reproductive conditions. Sometimes the cause of pelvic pain is not known. Follow these instructions at home:   Take over-the-counter and prescription medicines only as told by your health care provider.  Rest as told by your health care provider.  Do not have sex if it hurts.  Keep a journal of your pelvic pain. Write down: ? When the pain started. ? Where the pain is located. ? What seems to make the pain better or worse, such as food or your period (menstrual cycle). ? Any symptoms you have along with the pain.  Keep all follow-up visits as told by your health care provider. This is important. Contact a health care provider if:  Medicine does not help your  pain.  Your pain comes back.  You have new symptoms.  You have abnormal vaginal discharge or bleeding, including bleeding after menopause.  You have a fever or chills.  You are constipated.  You have blood in your urine or stool.  You have foul-smelling urine.  You feel weak or light-headed. Get help right away if:  You have sudden severe pain.  Your pain gets steadily worse.  You have severe pain along with fever, nausea, vomiting, or excessive sweating.  You lose consciousness. Summary  Pelvic pain is pain in your lower abdomen, below your belly button and between your hips.  There are many potential causes of pelvic pain.  Keep a journal of your pelvic pain. This information is not intended to replace advice given to you by your health care provider. Make sure you discuss any questions you have with your health care provider. Document Revised: 12/14/2017 Document Reviewed: 12/14/2017 Elsevier Patient Education  [place].

## 2019-10-15 ENCOUNTER — Encounter: Payer: Self-pay | Admitting: Physician Assistant

## 2019-10-15 DIAGNOSIS — E7841 Elevated Lipoprotein(a): Secondary | ICD-10-CM | POA: Insufficient documentation

## 2019-10-15 LAB — LIPID PANEL
Cholesterol: 140 mg/dL (ref <200)
HDL: 33 mg/dL — ABNORMAL LOW (ref >=50)
LDL Cholesterol (Calc): 84 mg/dL (calc)
Non-HDL Cholesterol (Calc): 107 mg/dL (calc) (ref <130)
Total CHOL/HDL Ratio: 4.2 (calc) (ref <5.0)
Triglycerides: 136 mg/dL (ref <150)

## 2019-10-15 LAB — URINALYSIS, ROUTINE W REFLEX MICROSCOPIC
Bilirubin Urine: NEGATIVE
Glucose, UA: NEGATIVE
Hgb urine dipstick: NEGATIVE
Ketones, ur: NEGATIVE
Leukocytes,Ua: NEGATIVE
Nitrite: NEGATIVE
Protein, ur: NEGATIVE
Specific Gravity, Urine: 1.009 (ref 1.001–1.03)
pH: 6 (ref 5.0–8.0)

## 2019-10-15 LAB — COMPLETE METABOLIC PANEL WITH GFR
AG Ratio: 2 (calc) (ref 1.0–2.5)
ALT: 28 U/L (ref 6–29)
AST: 22 U/L (ref 10–35)
Albumin: 4.6 g/dL (ref 3.6–5.1)
Alkaline phosphatase (APISO): 52 U/L (ref 31–125)
BUN: 16 mg/dL (ref 7–25)
CO2: 31 mmol/L (ref 20–32)
Calcium: 10.1 mg/dL (ref 8.6–10.2)
Chloride: 104 mmol/L (ref 98–110)
Creat: 0.59 mg/dL (ref 0.50–1.10)
GFR, Est African American: 128 mL/min/{1.73_m2} (ref >=60)
GFR, Est Non African American: 111 mL/min/{1.73_m2} (ref >=60)
Globulin: 2.3 g/dL (calc) (ref 1.9–3.7)
Glucose, Bld: 84 mg/dL (ref 65–99)
Potassium: 4.1 mmol/L (ref 3.5–5.3)
Sodium: 141 mmol/L (ref 135–146)
Total Bilirubin: 0.5 mg/dL (ref 0.2–1.2)
Total Protein: 6.9 g/dL (ref 6.1–8.1)

## 2019-10-15 LAB — HEMOGLOBIN A1C
Hgb A1c MFr Bld: 5.4 % of total Hgb (ref <5.7)
Mean Plasma Glucose: 108 (calc)
eAG (mmol/L): 6 (calc)

## 2019-10-15 LAB — CBC WITH DIFFERENTIAL/PLATELET
Absolute Monocytes: 786 cells/uL (ref 200–950)
Basophils Absolute: 58 cells/uL (ref 0–200)
Basophils Relative: 0.6 %
Eosinophils Absolute: 107 cells/uL (ref 15–500)
Eosinophils Relative: 1.1 %
HCT: 40.8 % (ref 35.0–45.0)
Hemoglobin: 13.7 g/dL (ref 11.7–15.5)
Lymphs Abs: 2978 cells/uL (ref 850–3900)
MCH: 32.2 pg (ref 27.0–33.0)
MCHC: 33.6 g/dL (ref 32.0–36.0)
MCV: 95.8 fL (ref 80.0–100.0)
MPV: 14.2 fL — ABNORMAL HIGH (ref 7.5–12.5)
Monocytes Relative: 8.1 %
Neutro Abs: 5772 cells/uL (ref 1500–7800)
Neutrophils Relative %: 59.5 %
Platelets: 133 10*3/uL — ABNORMAL LOW (ref 140–400)
RBC: 4.26 10*6/uL (ref 3.80–5.10)
RDW: 12.1 % (ref 11.0–15.0)
Total Lymphocyte: 30.7 %
WBC: 9.7 10*3/uL (ref 3.8–10.8)

## 2019-10-15 LAB — TSH: TSH: 0.65 mIU/L

## 2019-10-15 LAB — MICROALBUMIN / CREATININE URINE RATIO
Creatinine, Urine: 21 mg/dL (ref 20–275)
Microalb, Ur: <0.2 mg/dL

## 2019-10-15 LAB — MAGNESIUM: Magnesium: 2.2 mg/dL (ref 1.5–2.5)

## 2019-10-15 LAB — VITAMIN D 25 HYDROXY (VIT D DEFICIENCY, FRACTURES): Vit D, 25-Hydroxy: 79 ng/mL (ref 30–100)

## 2019-10-15 LAB — LIPOPROTEIN A (LPA): Lipoprotein (a): 171 nmol/L — ABNORMAL HIGH (ref <75)

## 2019-10-15 MED ORDER — ROSUVASTATIN CALCIUM 10 MG PO TABS: 10.0000 mg | ORAL_TABLET | Freq: Every day | ORAL | 11 refills | Status: DC

## 2019-11-07 ENCOUNTER — Ambulatory Visit: Payer: Self-pay | Attending: Internal Medicine

## 2019-11-07 DIAGNOSIS — Z23 Encounter for immunization: Secondary | ICD-10-CM

## 2019-11-07 NOTE — Progress Notes (Signed)
   Covid-19 Vaccination Clinic  Name:  Metha Kolasa    MRN: [id number] DOB: 1976-05-24  11/07/2019  Ms. Strain was observed post Covid-19 immunization for 15 minutes without incident. She was provided with Vaccine Information Sheet and instruction to access the V-Safe system.   Ms. Prescher was instructed to call 911 with any severe reactions post vaccine: Marland Kitchen Difficulty breathing  . Swelling of face and throat  . A fast heartbeat  . A bad rash all over body  . Dizziness and weakness   Immunizations Administered    Name Date Dose VIS Date Route   Pfizer COVID-19 Vaccine 11/07/2019  8:13 AM 0.3 mL 09/05/2018 Intramuscular   Manufacturer: [place]   Lot: [id number]   [place]: [address]-0

## 2019-12-13 ENCOUNTER — Other Ambulatory Visit: Payer: Self-pay

## 2019-12-13 MED ORDER — ROSUVASTATIN CALCIUM 10 MG PO TABS: 10.0000 mg | ORAL_TABLET | Freq: Every day | ORAL | 1 refills | Status: DC

## 2020-02-07 NOTE — Progress Notes (Signed)
Assessment and Plan:   Essential hypertension -     CBC with Diff -     COMPLETE METABOLIC PANEL WITH GFR -     TSH - continue medications, DASH diet, exercise and monitor at home. Call if greater than 130/80.   Hyperlipidemia, unspecified hyperlipidemia type -     Lipid Profile check lipids decrease fatty foods increase activity.   Tobacco use disorder Increase wellbutrin to 357m daily, instruction/counseling given, counseled patient on the dangers of tobacco use, advised patient to stop smoking, and reviewed strategies to maximize success, patient still trying to quit   Left sided back pain Will get Xray Continue gabapentin Will refer to PT and may need referral back to ortho No loss of bladder/bowel control, no saddle anesthesia, negative straight leg, Go to the ER if you have any new weakness in your legs, have trouble controlling your urine or bowels, or have worsening pain.    Continue diet and meds as discussed. Further disposition pending results of labs. Over 30 minutes of exam, counseling, chart review, and critical decision making was performed  Future Appointments  Date Time Provider DBell Buckle 10/10/2020  9:30 AM CVicie Mutters PA-C [facility] None     HPI 44y.o. female  presents for 6 month follow up on hypertension, cholesterol, prediabetes, and vitamin D deficiency.   She has history of left leg pain, saw Dr. HJunius Roadsin Dec. Given medrol dose pack and states he has been better. She has been having intermittent right buttocks pain for 3 months, no known injury. Describes deep ache in right buttocks, that can be down her right lateral leg into her calf.  Can nag her at night and be with walking, worse with sitting for a long time and then getting up, can be worse with steps.  No fever, she has had chills, no bladdery dysfunction.  She is on 1 gabapentin at night, has not increased it.  No weakness in her legs, no burning, tingling in her legs.  Has not  had imaging of lower back.    Her blood pressure has been controlled at home, she is on HCTZ and metorprolol, today their BP is BP: 120/76  BMI is Body mass index is 23.96 kg/m., she is working on diet and exercise. Wt Readings from Last 3 Encounters:  02/11/20 144 lb (65.3 kg)  10/11/19 141 lb 9.6 oz (64.2 kg)  07/09/19 143 lb 6.4 oz (65 kg)    She does workout, she is walking. She denies chest pain, shortness of breath, dizziness.  She continues to smoke, about a 1/2 a pack a day. She has tried chantix and did not like the feeling.  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 She is just on the 1530m  She is on cholesterol medication and denies myalgias, on crestor. Her cholesterol is at goal. The cholesterol last visit was:   Lab Results  Component Value Date   CHOL 140 10/11/2019   HDL 33 (L) 10/11/2019   LDLCALC 84 10/11/2019   TRIG 136 10/11/2019   CHOLHDL 4.2 10/11/2019    She has been working on diet and exercise for prediabetes, and denies paresthesia of the feet, polydipsia, polyuria and visual disturbances. Last A1C in the office was:  Lab Results  Component Value Date   HGBA1C 5.4 10/11/2019   Patient is on Vitamin D supplement.   Lab Results  Component Value Date  VD25OH 79 10/11/2019      Current Medications:     Current Outpatient Medications (Cardiovascular):  .  hydrochlorothiazide (HYDRODIURIL) 25 MG tablet, TAKE 1 TABLET BY MOUTH  DAILY .  metoprolol tartrate (LOPRESSOR) 50 MG tablet, TAKE 1 TABLET BY MOUTH  TWICE DAILY .  rosuvastatin (CRESTOR) 10 MG tablet, Take 1 tablet (10 mg total) by mouth at bedtime.  Current Outpatient Medications (Respiratory):  .  fluticasone (FLONASE) 50 MCG/ACT nasal spray, Place 2 sprays into both nostrils at bedtime. Marland Kitchen  levocetirizine (XYZAL) 5 MG tablet, Take 1 tablet daily for Allergies .  loratadine (CLARITIN) 10 MG tablet, Take 10 mg by mouth daily.  Current Outpatient  Medications (Analgesics):  .  acetaminophen (TYLENOL) 500 MG tablet, Take 1,000 mg by mouth every 6 (six) hours as needed. .  naproxen (NAPROSYN) 500 MG tablet, Take 1 tablet (500 mg total) by mouth 2 (two) times daily.  Current Outpatient Medications (Hematological):  Marland Kitchen  Cyanocobalamin (VITAMIN B 12 PO), Take by mouth daily.  Current Outpatient Medications (Other):  Marland Kitchen  buPROPion (WELLBUTRIN XL) 150 MG 24 hr tablet, TAKE 1 TABLET BY MOUTH  EVERY MORNING .  cholecalciferol (VITAMIN D) 1000 UNITS tablet, Take 2,000 Units by mouth daily. .  cyclobenzaprine (FLEXERIL) 10 MG tablet, TAKE as needed at night for headache/neck .  famotidine (PEPCID) 40 MG tablet, Take 40 mg by mouth daily. Marland Kitchen  gabapentin (NEURONTIN) 100 MG capsule, 1 PO q HS, may increase to 3 PO q HS if needed .  Magnesium 250 MG TABS, Take 250 mg by mouth daily. .  traZODone (DESYREL) 50 MG tablet, TAKE 1/2 TO 1 TABLET BY  MOUTH EVERY NIGHT AT  BEDTIME FOR SLEEP (Patient taking differently: at bedtime as needed. TAKE 1/2 TO 1 TABLET BY  MOUTH EVERY NIGHT AT  BEDTIME FOR SLEEP)  Medical History:  Past Medical History:  Diagnosis Date  . Allergy   . Endometriosis   . GERD (gastroesophageal reflux disease)   . Hyperlipidemia   . Hypertension   . Migraine   . Prediabetes   . Vitamin D deficiency    Allergies:  Allergies  Allergen Reactions  . Penicillins     rash     Review of Systems:  Review of Systems  Constitutional: Negative.   HENT: Negative for congestion (on claritin), ear discharge, ear pain, nosebleeds and sore throat.        + TMJ, wears night guard  Respiratory: Negative.  Negative for stridor.   Cardiovascular: Negative.   Gastrointestinal: Negative.   Genitourinary: Negative.   Musculoskeletal: Positive for myalgias (left foot). Negative for back pain, falls, joint pain and neck pain.  Skin: Negative.   Neurological: Negative for dizziness, sensory change, seizures and headaches.   Psychiatric/Behavioral: Negative.     Family history- Review and unchanged Social history- Review and unchanged Physical Exam: BP 120/76   Pulse 63   Temp 97.6 F (36.4 C)   Wt 144 lb (65.3 kg)   LMP 03/15/2015   SpO2 99%   BMI 23.96 kg/m  Wt Readings from Last 3 Encounters:  02/11/20 144 lb (65.3 kg)  10/11/19 141 lb 9.6 oz (64.2 kg)  07/09/19 143 lb 6.4 oz (65 kg)   General Appearance: Well nourished, in no apparent distress. Eyes: PERRLA, EOMs, conjunctiva no swelling or erythema Sinuses: No Frontal/maxillary tenderness ENT/Mouth: Ext aud canals clear, TMs without erythema, bulging. No erythema, swelling, or exudate on post pharynx.  Tonsils not swollen or erythematous. Hearing  normal.  Neck: Supple, thyroid with small nodules on the left.  Respiratory: Respiratory effort normal, BS equal bilaterally without rales, rhonchi, wheezing or stridor.  Cardio: RRR with no MRGs. Brisk peripheral pulses without edema.  Abdomen: Soft, + BS, nontender, no guarding, rebound, hernias, masses. Lymphatics: Non tender without lymphadenopathy.  Musculoskeletal:  No warmth, erythema. decreased sensation lateral right leg, negative straight leg raise, good pulse, good reflexes, normal bilateral strength. Some pain at right buttocks with back flexion, no pain with twisting. No pain at ischial bursa, no SI pain.  Skin: dry without rashes, lesions, ecchymosis.  Neuro: Cranial nerves intact. Normal muscle tone, no cerebellar symptoms. Psych: Awake and oriented X 3, normal affect, Insight and Judgment appropriate.    Vicie Mutters, PA-C 8:48 AM [facility]

## 2020-02-11 ENCOUNTER — Ambulatory Visit
Admission: RE | Admit: 2020-02-11 | Discharge: 2020-02-11 | Disposition: A | Discharge status: Home / Self Care | Payer: BC Managed Care – PPO | Source: Ambulatory Visit | Attending: Physician Assistant | Admitting: Physician Assistant

## 2020-02-11 ENCOUNTER — Encounter: Payer: Self-pay | Admitting: Physician Assistant

## 2020-02-11 ENCOUNTER — Ambulatory Visit: Payer: BC Managed Care – PPO | Admitting: Physician Assistant

## 2020-02-11 ENCOUNTER — Other Ambulatory Visit: Payer: Self-pay

## 2020-02-11 VITALS — BP 120/76 | HR 63 | Temp 97.6°F | Wt 144.0 lb

## 2020-02-11 DIAGNOSIS — R7309 Other abnormal glucose: Secondary | ICD-10-CM

## 2020-02-11 DIAGNOSIS — I10 Essential (primary) hypertension: Secondary | ICD-10-CM | POA: Diagnosis not present

## 2020-02-11 DIAGNOSIS — E559 Vitamin D deficiency, unspecified: Secondary | ICD-10-CM | POA: Diagnosis not present

## 2020-02-11 DIAGNOSIS — E7841 Elevated Lipoprotein(a): Secondary | ICD-10-CM

## 2020-02-11 DIAGNOSIS — E785 Hyperlipidemia, unspecified: Secondary | ICD-10-CM | POA: Diagnosis not present

## 2020-02-11 DIAGNOSIS — M545 Low back pain: Secondary | ICD-10-CM | POA: Diagnosis not present

## 2020-02-11 DIAGNOSIS — G8929 Other chronic pain: Secondary | ICD-10-CM

## 2020-02-11 DIAGNOSIS — Z79899 Other long term (current) drug therapy: Secondary | ICD-10-CM | POA: Diagnosis not present

## 2020-02-11 DIAGNOSIS — M5441 Lumbago with sciatica, right side: Secondary | ICD-10-CM

## 2020-02-11 MED ORDER — METHYLPREDNISOLONE 4 MG PO TBPK: ORAL_TABLET | ORAL | 0 refills | Status: DC

## 2020-02-11 NOTE — Patient Instructions (Addendum)
Increase wellbutrin to 375m or two of the 1518mat once for a week or two and see how you feel  We will send you to PT for your back We have sent in the medrol dose pack for you.   BACK PAIN  Try the exercises and other information in the back care manual.   You can take meloxicam once during the day as needed (avoid taking other NSAIDS like Alleve or Ibuprofen while taking this)   Go to the ER if you have any new weakness in your legs, have trouble controlling your urine or bowels, or have worsening pain.   If you are not better in 1-3 month we will refer you to ortho   Back pain Rehab Ask your health care provider which exercises are safe for you. Do exercises exactly as told by your health care provider and adjust them as directed. It is normal to feel mild stretching, pulling, tightness, or discomfort as you do these exercises, but you should stop right away if you feel sudden pain or your pain gets worse. Do not begin these exercises until told by your health care provider. Stretching and range of motion exercises These exercises warm up your muscles and joints and improve the movement and flexibility of your hips and your back. These exercises also help to relieve pain, numbness, and tingling. Exercise A: Sciatic nerve glide 1. Sit in a chair with your head facing down toward your chest. Place your hands behind your back. Let your shoulders slump forward. 2. Slowly straighten one of your knees while you tilt your head back as if you are looking toward the ceiling. Only straighten your leg as far as you can without making your symptoms worse. 3. Hold for __________ seconds. 4. Slowly return to the starting position. 5. Repeat with your other leg. Repeat __________ times. Complete this exercise __________ times a day. Exercise B: Knee to chest with hip adduction and internal rotation  1. Lie on your back on a firm surface with both legs straight. 2. Bend one of your knees and move  it up toward your chest until you feel a gentle stretch in your lower back and buttock. Then, move your knee toward the shoulder that is on the opposite side from your leg. ? Hold your leg in this position by holding onto the front of your knee. 3. Hold for __________ seconds. 4. Slowly return to the starting position. 5. Repeat with your other leg. Repeat __________ times. Complete this exercise __________ times a day. Exercise C: Prone extension on elbows  1. Lie on your abdomen on a firm surface. A bed may be too soft for this exercise. 2. Prop yourself up on your elbows. 3. Use your arms to help lift your chest up until you feel a gentle stretch in your abdomen and your lower back. ? This will place some of your body weight on your elbows. If this is uncomfortable, try stacking pillows under your chest. ? Your hips should stay down, against the surface that you are lying on. Keep your hip and back muscles relaxed. 4. Hold for __________ seconds. 5. Slowly relax your upper body and return to the starting position. Repeat __________ times. Complete this exercise __________ times a day. Strengthening exercises These exercises build strength and endurance in your back. Endurance is the ability to use your muscles for a long time, even after they get tired. Exercise D: Pelvic tilt 1. Lie on your back on a firm  surface. Bend your knees and keep your feet flat. 2. Tense your abdominal muscles. Tip your pelvis up toward the ceiling and flatten your lower back into the floor. ? To help with this exercise, you may place a small towel under your lower back and try to push your back into the towel. 3. Hold for __________ seconds. 4. Let your muscles relax completely before you repeat this exercise. Repeat __________ times. Complete this exercise __________ times a day. Exercise E: Alternating arm and leg raises  1. Get on your hands and knees on a firm surface. If you are on a hard floor, you may  want to use padding to cushion your knees, such as an exercise mat. 2. Line up your arms and legs. Your hands should be below your shoulders, and your knees should be below your hips. 3. Lift your left leg behind you. At the same time, raise your right arm and straighten it in front of you. ? Do not lift your leg higher than your hip. ? Do not lift your arm higher than your shoulder. ? Keep your abdominal and back muscles tight. ? Keep your hips facing the ground. ? Do not arch your back. ? Keep your balance carefully, and do not hold your breath. 4. Hold for __________ seconds. 5. Slowly return to the starting position and repeat with your right leg and your left arm. Repeat __________ times. Complete this exercise __________ times a day. Posture and body mechanics  Body mechanics refers to the movements and positions of your body while you do your daily activities. Posture is part of body mechanics. Good posture and healthy body mechanics can help to relieve stress in your body's tissues and joints. Good posture means that your spine is in its natural S-curve position (your spine is neutral), your shoulders are pulled back slightly, and your head is not tipped forward. The following are general guidelines for applying improved posture and body mechanics to your everyday activities. Standing   When standing, keep your spine neutral and your feet about hip-width apart. Keep a slight bend in your knees. Your ears, shoulders, and hips should line up.  When you do a task in which you stand in one place for a long time, place one foot up on a stable object that is 2-4 inches (5-10 cm) high, such as a footstool. This helps keep your spine neutral. Sitting   When sitting, keep your spine neutral and keep your feet flat on the floor. Use a footrest, if necessary, and keep your thighs parallel to the floor. Avoid rounding your shoulders, and avoid tilting your head forward.  When working at a desk  or a computer, keep your desk at a height where your hands are slightly lower than your elbows. Slide your chair under your desk so you are close enough to maintain good posture.  When working at a computer, place your monitor at a height where you are looking straight ahead and you do not have to tilt your head forward or downward to look at the screen. Resting   When lying down and resting, avoid positions that are most painful for you.  If you have pain with activities such as sitting, bending, stooping, or squatting (flexion-based activities), lie in a position in which your body does not bend very much. For example, avoid curling up on your side with your arms and knees near your chest (fetal position).  If you have pain with activities such as standing  for a long time or reaching with your arms (extension-based activities), lie with your spine in a neutral position and bend your knees slightly. Try the following positions: ? Lying on your side with a pillow between your knees. ? Lying on your back with a pillow under your knees. Lifting   When lifting objects, keep your feet at least shoulder-width apart and tighten your abdominal muscles.  Bend your knees and hips and keep your spine neutral. It is important to lift using the strength of your legs, not your back. Do not lock your knees straight out.  Always ask for help to lift heavy or awkward objects. This information is not intended to replace advice given to you by your health care provider. Make sure you discuss any questions you have with your health care provider. Document Released: 06/28/2005 Document Revised: 03/04/2016 Document Reviewed: 03/14/2015 Elsevier Interactive Patient Education  [employer].

## 2020-02-12 LAB — CBC WITH DIFFERENTIAL/PLATELET
Absolute Monocytes: 843 cells/uL (ref 200–950)
Basophils Absolute: 49 cells/uL (ref 0–200)
Basophils Relative: 0.5 %
Eosinophils Absolute: 108 cells/uL (ref 15–500)
Eosinophils Relative: 1.1 %
HCT: 41.6 % (ref 35.0–45.0)
Hemoglobin: 14.2 g/dL (ref 11.7–15.5)
Lymphs Abs: 2460 cells/uL (ref 850–3900)
MCH: 32.8 pg (ref 27.0–33.0)
MCHC: 34.1 g/dL (ref 32.0–36.0)
MCV: 96.1 fL (ref 80.0–100.0)
MPV: 13.7 fL — ABNORMAL HIGH (ref 7.5–12.5)
Monocytes Relative: 8.6 %
Neutro Abs: 6341 cells/uL (ref 1500–7800)
Neutrophils Relative %: 64.7 %
Platelets: 149 10*3/uL (ref 140–400)
RBC: 4.33 10*6/uL (ref 3.80–5.10)
RDW: 12.1 % (ref 11.0–15.0)
Total Lymphocyte: 25.1 %
WBC: 9.8 10*3/uL (ref 3.8–10.8)

## 2020-02-12 LAB — COMPLETE METABOLIC PANEL WITH GFR
AG Ratio: 1.8 (calc) (ref 1.0–2.5)
ALT: 25 U/L (ref 6–29)
AST: 22 U/L (ref 10–35)
Albumin: 4.4 g/dL (ref 3.6–5.1)
Alkaline phosphatase (APISO): 60 U/L (ref 31–125)
BUN: 16 mg/dL (ref 7–25)
CO2: 28 mmol/L (ref 20–32)
Calcium: 10 mg/dL (ref 8.6–10.2)
Chloride: 107 mmol/L (ref 98–110)
Creat: 0.64 mg/dL (ref 0.50–1.10)
GFR, Est African American: 124 mL/min/{1.73_m2} (ref >=60)
GFR, Est Non African American: 107 mL/min/{1.73_m2} (ref >=60)
Globulin: 2.4 g/dL (calc) (ref 1.9–3.7)
Glucose, Bld: 83 mg/dL (ref 65–99)
Potassium: 4.5 mmol/L (ref 3.5–5.3)
Sodium: 140 mmol/L (ref 135–146)
Total Bilirubin: 0.4 mg/dL (ref 0.2–1.2)
Total Protein: 6.8 g/dL (ref 6.1–8.1)

## 2020-02-12 LAB — LIPID PANEL
Cholesterol: 135 mg/dL (ref <200)
HDL: 34 mg/dL — ABNORMAL LOW (ref >=50)
LDL Cholesterol (Calc): 75 mg/dL (calc)
Non-HDL Cholesterol (Calc): 101 mg/dL (calc) (ref <130)
Total CHOL/HDL Ratio: 4 (calc) (ref <5.0)
Triglycerides: 162 mg/dL — ABNORMAL HIGH (ref <150)

## 2020-02-12 LAB — VITAMIN D 25 HYDROXY (VIT D DEFICIENCY, FRACTURES): Vit D, 25-Hydroxy: 83 ng/mL (ref 30–100)

## 2020-02-12 LAB — HLA-B27 ANTIGEN: HLA-B27 Antigen: NEGATIVE

## 2020-02-12 LAB — HEMOGLOBIN A1C
Hgb A1c MFr Bld: 5.3 % of total Hgb (ref <5.7)
Mean Plasma Glucose: 105 (calc)
eAG (mmol/L): 5.8 (calc)

## 2020-02-12 LAB — TSH: TSH: 0.86 mIU/L

## 2020-02-12 LAB — MAGNESIUM: Magnesium: 2.1 mg/dL (ref 1.5–2.5)

## 2020-02-26 DIAGNOSIS — F4321 Adjustment disorder with depressed mood: Secondary | ICD-10-CM

## 2020-02-26 DIAGNOSIS — F32A Depression, unspecified: Secondary | ICD-10-CM | POA: Insufficient documentation

## 2020-02-26 DIAGNOSIS — Z1231 Encounter for screening mammogram for malignant neoplasm of breast: Secondary | ICD-10-CM | POA: Diagnosis not present

## 2020-02-26 DIAGNOSIS — Z6824 Body mass index (BMI) 24.0-24.9, adult: Secondary | ICD-10-CM | POA: Diagnosis not present

## 2020-02-26 DIAGNOSIS — Z01419 Encounter for gynecological examination (general) (routine) without abnormal findings: Secondary | ICD-10-CM | POA: Diagnosis not present

## 2020-02-27 MED ORDER — BUPROPION HCL ER (XL) 300 MG PO TB24: 300.0000 mg | ORAL_TABLET | Freq: Every morning | ORAL | 3 refills | Status: DC

## 2020-03-11 ENCOUNTER — Other Ambulatory Visit: Payer: Self-pay | Admitting: Physician Assistant

## 2020-03-11 DIAGNOSIS — Z0001 Encounter for general adult medical examination with abnormal findings: Secondary | ICD-10-CM

## 2020-05-01 ENCOUNTER — Other Ambulatory Visit: Payer: Self-pay

## 2020-05-01 MED ORDER — ROSUVASTATIN CALCIUM 10 MG PO TABS: 10.0000 mg | ORAL_TABLET | Freq: Every day | ORAL | 1 refills | Status: DC

## 2020-06-04 ENCOUNTER — Other Ambulatory Visit: Payer: Self-pay | Admitting: Internal Medicine

## 2020-06-04 ENCOUNTER — Other Ambulatory Visit: Payer: Self-pay | Admitting: Adult Health

## 2020-06-04 DIAGNOSIS — Z0001 Encounter for general adult medical examination with abnormal findings: Secondary | ICD-10-CM

## 2020-06-04 DIAGNOSIS — I10 Essential (primary) hypertension: Secondary | ICD-10-CM

## 2020-06-16 ENCOUNTER — Other Ambulatory Visit: Payer: Self-pay

## 2020-06-16 ENCOUNTER — Ambulatory Visit: Payer: BC Managed Care – PPO | Admitting: Adult Health Nurse Practitioner

## 2020-06-16 ENCOUNTER — Encounter: Payer: Self-pay | Admitting: Adult Health Nurse Practitioner

## 2020-06-16 VITALS — BP 122/84 | HR 63 | Temp 97.5°F | Ht 65.0 in | Wt 144.5 lb

## 2020-06-16 DIAGNOSIS — E785 Hyperlipidemia, unspecified: Secondary | ICD-10-CM

## 2020-06-16 DIAGNOSIS — I10 Essential (primary) hypertension: Secondary | ICD-10-CM

## 2020-06-16 NOTE — Patient Instructions (Signed)
  Choose an antihistamine to help dry up nasal drainage. Claritin - Works best for [employer], [employer].   Zyrtec / Cetirizine Take 14m by mouth May cause drowsiness, take nightly Be sure to drink plenty of water If this is not effective, try Xyzal OR Allegra  OR  Xyzal / Levocetirazine  Take 524mby mouth May cause drowsiness, take nightly Be sure to drink plenty of water If this is not effective try Allegra OR Zyrtec  Can Also take Allegra with one option from above.  Allegra / fexofenadine Take 18019my mouth If this is not effective try Zyrtec OR Xyzal   *If you battle with chronic allergies you may need to change the antihistamine you currently use to find most effective.  -[facility] Sinus Rinse / Neti Pot Use warm bottled or distilled water DO NOT use tap water! Use twice a day as needed This will help to sooth irritated sinuses and clear nasal congestion If using nasal sprays, do so after completing this.  Flonase: Can try changing to Nasocort One spray in each nostril daily  This will help to open your nasal passages.  Saline Nasal Spray: to sooth your nose You can get this at any pharmacy Use as directed on package This will help to sooth inside of your nose from irritation.    Try Eucerin -Redness relief cream.    GENERAL HEALTH GOALS  Know what a healthy weight is for you (roughly BMI <25) and aim to maintain this  Aim for 7+ servings of fruits and vegetables daily  70-80+ fluid ounces of water or unsweet tea for healthy kidneys  Limit to max 1 drink of alcohol per day; avoid smoking/tobacco  Limit animal fats in diet for cholesterol and heart health - choose grass fed whenever available  Avoid highly processed foods, and foods high in saturated/trans fats  Aim for low stress - take time to unwind and care for your mental health  Aim for 150 min of moderate intensity exercise weekly for heart health, and weights twice weekly for bone  health  Aim for 7-9 hours of sleep daily

## 2020-06-16 NOTE — Progress Notes (Signed)
Assessment and Plan:   Essential hypertension -     CBC with Diff -     COMPLETE METABOLIC PANEL WITH GFR -     TSH - continue medications, DASH diet, exercise and monitor at home. Call if greater than 130/80.   Hyperlipidemia, unspecified hyperlipidemia type -     Lipid Profile check lipids decrease fatty foods increase activity.   Tobacco use disorder Increase wellbutrin to 382m daily, instruction/counseling given, counseled patient on the dangers of tobacco use, advised patient to stop smoking, and reviewed strategies to maximize success, patient still trying to quit   Left sided back pain Will get Xray Continue gabapentin Will refer to PT and may need referral back to ortho No loss of bladder/bowel control, no saddle anesthesia, negative straight leg, Go to the ER if you have any new weakness in your legs, have trouble controlling your urine or bowels, or have worsening pain.    Continue diet and meds as discussed. Further disposition pending results of labs. Over 30 minutes of exam, counseling, chart review, and critical decision making was performed  Future Appointments  Date Time Provider DIone 10/10/2020  9:00 AM CLiane Comber NP [facility] None     HPI 44y.o. female  presents for 6 month follow up on HTN, HLD, prediabetes/abnormal glucose, and vitamin D deficiency.   She reports that she has redness to her face on the check and top of nose between eye brows. She reports is is solid red and slightly splotchy between her eye brows.  She has had this fore over a year.  She is now having spots on her legs that are red and spots, but group together.  She reports she has tried lotion but has not put any other topical preparations.  Denies any itchiness. She is using an all natural bees wax type cream.  She has used this for years.  She battles seasonal allergies and takign Claritin, flonase and xyzal.  She reports she is having significant nasal congestion  and  Not sure what to change.  She has history of left leg pain, saw Dr. HJunius Roadsinpast. Given medrol dose pack and states he has been better. She has been having intermittent right buttocks pain for 3 months, no known injury. Describes deep ache in right buttocks, that can be down her right lateral leg into her calf.  Can nag her at night and be with walking, worse with sitting for a long time and then getting up, can be worse with steps. Report this has improved and managing. No fever, she has had chills, no bladdery dysfunction.  She is on 1 gabapentin at night, has not increased it.  No weakness in her legs, no burning, tingling in her legs.  Has not had imaging of lower back.    Her blood pressure has been controlled at home, she is on HCTZ and metorprolol, today their BP is BP: 122/84  BMI is Body mass index is 24.05 kg/m., she is working on diet and exercise. Wt Readings from Last 3 Encounters:  06/16/20 144 lb 8 oz (65.5 kg)  02/11/20 144 lb (65.3 kg)  10/11/19 141 lb 9.6 oz (64.2 kg)    She does workout, she is walking. She denies chest pain, shortness of breath, dizziness.  She continues to smoke, about a 1/2 a pack a day. She has tried chantix and did not like the feeling.  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 She is on the 141m   She is on cholesterol medication and denies myalgias, on Rosuvastatin 171mnightly. Her cholesterol is at goal. The cholesterol last visit was:   Lab Results  Component Value Date   CHOL 135 02/11/2020   HDL 34 (L) 02/11/2020   LDLCALC 75 02/11/2020   TRIG 162 (H) 02/11/2020   CHOLHDL 4.0 02/11/2020    She has been working on diet and exercise for prediabetes, and denies paresthesia of the feet, polydipsia, polyuria and visual disturbances. Last A1C in the office was:  Lab Results  Component Value Date   HGBA1C 5.3 02/11/2020   Patient is on Vitamin D supplement.   Lab Results  Component Value  Date   VD25OH 83 02/11/2020      Current Medications:    Current Outpatient Medications (Endocrine & Metabolic):  .  methylPREDNISolone (MEDROL DOSEPAK) 4 MG TBPK tablet, As directed for 6 days  Current Outpatient Medications (Cardiovascular):  .  hydrochlorothiazide (HYDRODIURIL) 25 MG tablet, Take      1 tablet      Daily       for BP and Fluid Retention / Ankle Swelling .  metoprolol tartrate (LOPRESSOR) 50 MG tablet, Take      1 tablet      2 x /day (every 12 hours)       for BP .  rosuvastatin (CRESTOR) 10 MG tablet, Take 1 tablet (10 mg total) by mouth at bedtime.  Current Outpatient Medications (Respiratory):  .  fluticasone (FLONASE) 50 MCG/ACT nasal spray, Place 2 sprays into both nostrils at bedtime. . Marland Kitchenlevocetirizine (XYZAL) 5 MG tablet, Take 1 tablet daily for Allergies .  loratadine (CLARITIN) 10 MG tablet, Take 10 mg by mouth daily.  Current Outpatient Medications (Analgesics):  .  acetaminophen (TYLENOL) 500 MG tablet, Take 1,000 mg by mouth every 6 (six) hours as needed. .  naproxen (NAPROSYN) 500 MG tablet, Take 1 tablet (500 mg total) by mouth 2 (two) times daily.  Current Outpatient Medications (Hematological):  . Marland KitchenCyanocobalamin (VITAMIN B 12 PO), Take by mouth daily.  Current Outpatient Medications (Other):  . Marland KitchenbuPROPion (WELLBUTRIN XL) 300 MG 24 hr tablet, Take 1 tablet (300 mg total) by mouth every morning. .  cholecalciferol (VITAMIN D) 1000 UNITS tablet, Take 2,000 Units by mouth daily. .  cyclobenzaprine (FLEXERIL) 10 MG tablet, TAKE as needed at night for headache/neck .  famotidine (PEPCID) 40 MG tablet, Take 40 mg by mouth daily. . Marland Kitchengabapentin (NEURONTIN) 100 MG capsule, 1 PO q HS, may increase to 3 PO q HS if needed .  Magnesium 250 MG TABS, Take 250 mg by mouth daily. .  traZODone (DESYREL) 50 MG tablet, TAKE 1/2 TO 1 TABLET BY  MOUTH EVERY NIGHT AT  BEDTIME FOR SLEEP (Patient taking differently: at bedtime as needed. TAKE 1/2 TO 1 TABLET BY  MOUTH  EVERY NIGHT AT  BEDTIME FOR SLEEP) .  TURMERIC PO, Take 500 mg by mouth.  Medical History:  Past Medical History:  Diagnosis Date  . Allergy   . Endometriosis   . GERD (gastroesophageal reflux disease)   . Hyperlipidemia   . Hypertension   . Migraine   . Prediabetes   . Vitamin D deficiency    Allergies:  Allergies  Allergen Reactions  . Penicillins     rash     Review of Systems:  Review of Systems  Constitutional: Negative.   HENT: Negative for  congestion (on claritin), ear discharge, ear pain, nosebleeds and sore throat.        + TMJ, wears night guard  Respiratory: Negative.  Negative for stridor.   Cardiovascular: Negative.   Gastrointestinal: Negative.   Genitourinary: Negative.   Musculoskeletal: Positive for myalgias (left foot). Negative for back pain, falls, joint pain and neck pain.  Skin: Negative.   Neurological: Negative for dizziness, sensory change, seizures and headaches.  Psychiatric/Behavioral: Negative.     Family history- Review and unchanged Social history- Review and unchanged  Physical Exam: BP 122/84   Pulse 63   Temp (!) 97.5 F (36.4 C)   Ht 5' 5"  (1.651 m)   Wt 144 lb 8 oz (65.5 kg)   LMP 03/15/2015   SpO2 97%   BMI 24.05 kg/m  Wt Readings from Last 3 Encounters:  06/16/20 144 lb 8 oz (65.5 kg)  02/11/20 144 lb (65.3 kg)  10/11/19 141 lb 9.6 oz (64.2 kg)   General Appearance: Well nourished, in no apparent distress. Eyes: PERRLA, EOMs, conjunctiva no swelling or erythema Sinuses: No Frontal/maxillary tenderness ENT/Mouth: Ext aud canals clear, TMs without erythema, bulging. No erythema, swelling, or exudate on post pharynx.  Tonsils not swollen or erythematous. Hearing normal.  Neck: Supple, thyroid with small nodules on the left.  Respiratory: Respiratory effort normal, BS equal bilaterally without rales, rhonchi, wheezing or stridor.  Cardio: RRR with no MRGs. Brisk peripheral pulses without edema.  Abdomen: Soft, + BS,  nontender, no guarding, rebound, hernias, masses. Lymphatics: Non tender without lymphadenopathy.  Musculoskeletal:  No warmth, erythema. decreased sensation lateral right leg, negative straight leg raise, good pulse, good reflexes, normal bilateral strength. Some pain at right buttocks with back flexion, no pain with twisting. No pain at ischial bursa, no SI pain.  Skin: dry without rashes, lesions, ecchymosis.  Neuro: Cranial nerves intact. Normal muscle tone, no cerebellar symptoms. Psych: Awake and oriented X 3, normal affect, Insight and Judgment appropriate.     Garnet Sierras, Laqueta Jean, DNP [facility] 06/16/2020  9:50 AM

## 2020-06-17 LAB — LIPID PANEL
Cholesterol: 130 mg/dL (ref <200)
HDL: 40 mg/dL — ABNORMAL LOW (ref >=50)
LDL Cholesterol (Calc): 70 mg/dL (calc)
Non-HDL Cholesterol (Calc): 90 mg/dL (calc) (ref <130)
Total CHOL/HDL Ratio: 3.3 (calc) (ref <5.0)
Triglycerides: 114 mg/dL (ref <150)

## 2020-06-17 LAB — CBC WITH DIFFERENTIAL/PLATELET
Absolute Monocytes: 672 cells/uL (ref 200–950)
Basophils Absolute: 47 cells/uL (ref 0–200)
Basophils Relative: 0.6 %
Eosinophils Absolute: 103 cells/uL (ref 15–500)
Eosinophils Relative: 1.3 %
HCT: 40.2 % (ref 35.0–45.0)
Hemoglobin: 13.5 g/dL (ref 11.7–15.5)
Lymphs Abs: 3231 cells/uL (ref 850–3900)
MCH: 32.9 pg (ref 27.0–33.0)
MCHC: 33.6 g/dL (ref 32.0–36.0)
MCV: 98 fL (ref 80.0–100.0)
MPV: 13.9 fL — ABNORMAL HIGH (ref 7.5–12.5)
Monocytes Relative: 8.5 %
Neutro Abs: 3847 cells/uL (ref 1500–7800)
Neutrophils Relative %: 48.7 %
Platelets: 151 10*3/uL (ref 140–400)
RBC: 4.1 10*6/uL (ref 3.80–5.10)
RDW: 11.7 % (ref 11.0–15.0)
Total Lymphocyte: 40.9 %
WBC: 7.9 10*3/uL (ref 3.8–10.8)

## 2020-06-17 LAB — COMPLETE METABOLIC PANEL WITH GFR
AG Ratio: 2.1 (calc) (ref 1.0–2.5)
ALT: 27 U/L (ref 6–29)
AST: 22 U/L (ref 10–35)
Albumin: 4.4 g/dL (ref 3.6–5.1)
Alkaline phosphatase (APISO): 59 U/L (ref 31–125)
BUN: 15 mg/dL (ref 7–25)
CO2: 26 mmol/L (ref 20–32)
Calcium: 9.7 mg/dL (ref 8.6–10.2)
Chloride: 108 mmol/L (ref 98–110)
Creat: 0.55 mg/dL (ref 0.50–1.10)
GFR, Est African American: 130 mL/min/{1.73_m2} (ref >=60)
GFR, Est Non African American: 112 mL/min/{1.73_m2} (ref >=60)
Globulin: 2.1 g/dL (calc) (ref 1.9–3.7)
Glucose, Bld: 70 mg/dL (ref 65–99)
Potassium: 4.4 mmol/L (ref 3.5–5.3)
Sodium: 142 mmol/L (ref 135–146)
Total Bilirubin: 0.4 mg/dL (ref 0.2–1.2)
Total Protein: 6.5 g/dL (ref 6.1–8.1)

## 2020-07-09 ENCOUNTER — Other Ambulatory Visit: Payer: Self-pay

## 2020-07-09 ENCOUNTER — Ambulatory Visit: Payer: BC Managed Care – PPO | Admitting: *Deleted

## 2020-07-09 VITALS — BP 124/88 | HR 70 | Temp 96.9°F | Wt 147.4 lb

## 2020-07-09 DIAGNOSIS — R300 Dysuria: Secondary | ICD-10-CM

## 2020-07-09 DIAGNOSIS — R7989 Other specified abnormal findings of blood chemistry: Secondary | ICD-10-CM

## 2020-07-09 NOTE — Progress Notes (Signed)
Patient here for a NV due to urine frequency and left flank pain x 2 days. Patient reported no burning at this time. Patient left urine sample for urinalysis and culture.

## 2020-07-10 LAB — URINALYSIS, ROUTINE W REFLEX MICROSCOPIC
Bilirubin Urine: NEGATIVE
Glucose, UA: NEGATIVE
Hgb urine dipstick: NEGATIVE
Ketones, ur: NEGATIVE
Leukocytes,Ua: NEGATIVE
Nitrite: NEGATIVE
Protein, ur: NEGATIVE
Specific Gravity, Urine: 1.011 (ref 1.001–1.03)
pH: 6 (ref 5.0–8.0)

## 2020-07-10 LAB — URINE CULTURE
MICRO NUMBER:: 11367938
Result:: NO GROWTH
SPECIMEN QUALITY:: ADEQUATE

## 2020-07-30 ENCOUNTER — Ambulatory Visit: Payer: BC Managed Care – PPO | Admitting: Adult Health Nurse Practitioner

## 2020-08-26 ENCOUNTER — Other Ambulatory Visit: Payer: Self-pay | Admitting: Internal Medicine

## 2020-08-26 DIAGNOSIS — I10 Essential (primary) hypertension: Secondary | ICD-10-CM

## 2020-08-26 DIAGNOSIS — Z0001 Encounter for general adult medical examination with abnormal findings: Secondary | ICD-10-CM

## 2020-10-02 ENCOUNTER — Other Ambulatory Visit: Payer: Self-pay | Admitting: Internal Medicine

## 2020-10-02 DIAGNOSIS — Z0001 Encounter for general adult medical examination with abnormal findings: Secondary | ICD-10-CM

## 2020-10-02 DIAGNOSIS — Z9109 Other allergy status, other than to drugs and biological substances: Secondary | ICD-10-CM

## 2020-10-10 ENCOUNTER — Encounter: Payer: BC Managed Care – PPO | Admitting: Adult Health

## 2020-10-13 ENCOUNTER — Encounter: Payer: Self-pay | Admitting: Adult Health Nurse Practitioner

## 2020-10-13 ENCOUNTER — Other Ambulatory Visit: Payer: Self-pay

## 2020-10-13 ENCOUNTER — Ambulatory Visit (INDEPENDENT_AMBULATORY_CARE_PROVIDER_SITE_OTHER): Payer: BC Managed Care – PPO | Admitting: Adult Health Nurse Practitioner

## 2020-10-13 VITALS — BP 118/80 | HR 67 | Temp 97.5°F | Ht 65.0 in | Wt 149.6 lb

## 2020-10-13 DIAGNOSIS — Z0001 Encounter for general adult medical examination with abnormal findings: Secondary | ICD-10-CM

## 2020-10-13 DIAGNOSIS — Z9109 Other allergy status, other than to drugs and biological substances: Secondary | ICD-10-CM

## 2020-10-13 DIAGNOSIS — Z1329 Encounter for screening for other suspected endocrine disorder: Secondary | ICD-10-CM | POA: Diagnosis not present

## 2020-10-13 DIAGNOSIS — E538 Deficiency of other specified B group vitamins: Secondary | ICD-10-CM

## 2020-10-13 DIAGNOSIS — Z13220 Encounter for screening for lipoid disorders: Secondary | ICD-10-CM

## 2020-10-13 DIAGNOSIS — G8929 Other chronic pain: Secondary | ICD-10-CM

## 2020-10-13 DIAGNOSIS — I10 Essential (primary) hypertension: Secondary | ICD-10-CM | POA: Diagnosis not present

## 2020-10-13 DIAGNOSIS — Z0000 Encounter for general adult medical examination without abnormal findings: Secondary | ICD-10-CM | POA: Diagnosis not present

## 2020-10-13 DIAGNOSIS — Z130 Encounter for screening for diseases of the blood and blood-forming organs and certain disorders involving the immune mechanism: Secondary | ICD-10-CM

## 2020-10-13 DIAGNOSIS — E785 Hyperlipidemia, unspecified: Secondary | ICD-10-CM

## 2020-10-13 DIAGNOSIS — Z131 Encounter for screening for diabetes mellitus: Secondary | ICD-10-CM

## 2020-10-13 DIAGNOSIS — Z1389 Encounter for screening for other disorder: Secondary | ICD-10-CM

## 2020-10-13 DIAGNOSIS — R7309 Other abnormal glucose: Secondary | ICD-10-CM

## 2020-10-13 DIAGNOSIS — M5441 Lumbago with sciatica, right side: Secondary | ICD-10-CM

## 2020-10-13 DIAGNOSIS — E559 Vitamin D deficiency, unspecified: Secondary | ICD-10-CM | POA: Diagnosis not present

## 2020-10-13 DIAGNOSIS — Z8249 Family history of ischemic heart disease and other diseases of the circulatory system: Secondary | ICD-10-CM

## 2020-10-13 DIAGNOSIS — K824 Cholesterolosis of gallbladder: Secondary | ICD-10-CM

## 2020-10-13 DIAGNOSIS — E7841 Elevated Lipoprotein(a): Secondary | ICD-10-CM

## 2020-10-13 DIAGNOSIS — Z79899 Other long term (current) drug therapy: Secondary | ICD-10-CM | POA: Diagnosis not present

## 2020-10-13 DIAGNOSIS — F17200 Nicotine dependence, unspecified, uncomplicated: Secondary | ICD-10-CM

## 2020-10-13 DIAGNOSIS — Z136 Encounter for screening for cardiovascular disorders: Secondary | ICD-10-CM

## 2020-10-13 DIAGNOSIS — G43809 Other migraine, not intractable, without status migrainosus: Secondary | ICD-10-CM

## 2020-10-13 NOTE — Progress Notes (Signed)
COMPLETE PHYSICAL   Assessment and Plan:   Andrea Owens was seen today for annual exam.  Diagnoses and all orders for this visit:  Encounter for routine adult medical exam with abnormal findings Yearly  Essential hypertension Continue current medications: Metoprolol 77m BID and HCTZ 252mdaily in am. Monitor blood pressure at home; call if consistently over 130/80 Continue DASH diet.   Reminder to go to the ER if any CP, SOB, nausea, dizziness, severe HA, changes vision/speech, left arm numbness and tingling and jaw pain. -     CBC with Differential/Platelet -     COMPLETE METABOLIC PANEL WITH GFR -     TSH -     Magnesium -     EKG 12-Lead  Hyperlipidemia, unspecified hyperlipidemia type Elevated lipoprotein(a) Family history of early CAD Family history MI at [age]ardiac calcium score at age [age]Discussed with patient -     Lipid panel  Tobacco use disorder Half a pack a day Discussed smoking cessation Not ready to quit at this time  Abnormal glucose Discussed dietary and exercise modifications -     Hemoglobin A1c  Vitamin D deficiency Continue supplementation to maintain goal of 70-100 Taking Vitamin D 2,000 IU daily -     VITAMIN D 25 Hydroxy (Vit-D Deficiency, Fractures)  Gallbladder polyp Monitor  Chronic midline low back pain with right-sided sciatica Continue follow up with Dr HiDerry LoryMedication management Continued  Other migraine without status migrainosus, not intractable Doing well at this time  Environmental allergies Managing with allegra and xyzal prn  Screening, ischemic heart disease -EKG Brady, no ST changes  Screening for diabetes mellitus -A1c Screening for blood or protein in urine -     Urinalysis w microscopic + reflex cultur  B12 deficiency -     Vitamin B12   Continue diet and meds as discussed. Further disposition pending results of labs. Over 30 minutes of face to face interview, exam, counseling, chart review, and  critical decision making was performed  Future Appointments  Date Time Provider [place]4/10/2021  9:00 AM Dalonte Hardage, KyDanton SewerNP [facility] None     HPI 89.o. female  presents for 6 month follow up on HTN, HLD, prediabetes/abnormal glucose, and vitamin D deficiency.   Overall she is doing well.  No medication concerns today.    She reports that she has bilateral ingrown toenails.  She reports this is chronic and battles with this multiple times.  Would like referral to podiatry for this.  She battles seasonal allergies and taking Zyrtec and Allegra D at this time.  Has also used Claritin, flonase and xyzal in the past..  She reports she is having significant nasal congestion and  Not sure what to change.  She has history of left leg pain, saw Dr. HiJunius Roadsnpast. Last OV 07/10/2019. Given medrol dose pack and states he has been better. She has been having intermittent right buttocks pain for 3 months, no known injury. Describes deep ache in right buttocks, that can be down her right lateral leg into her calf.  Can nag her at night and be with walking, worse with sitting for a long time and then getting up, can be worse with steps. Report this has improved and managing. No fever, she has had chills, no bladdery dysfunction.  She is on 1 gabapentin at night, three times a week.  Discussed increasing this to nightly.  Also having intermittent tingling to bilateral hands.  Denies any cervicalgia. No weakness  in her legs, no burning, tingling in her legs.  Has not had imaging of lower back.    Her blood pressure has been controlled at home, she is on HCTZ 85m daily and metoprolol 554mBID, today their BP is BP: 118/80   BMI is Body mass index is 24.89 kg/m., she is working on diet and exercise. Wt Readings from Last 3 Encounters:  10/13/20 149 lb 9.6 oz (67.9 kg)  07/09/20 147 lb 6.4 oz (66.9 kg)  06/16/20 144 lb 8 oz (65.5 kg)    She does workout, she is walking. She denies chest  pain, shortness of breath, dizziness.   She continues to smoke, about a 1/2 a pack a day. She has tried chantix and did not like the feeling.  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 She is on the 15024maily.    She is on cholesterol medication and denies myalgias, on Rosuvastatin 28m21mghtly. Her cholesterol is at goal. The cholesterol last visit was:   Lab Results  Component Value Date   CHOL 130 06/16/2020   HDL 40 (L) 06/16/2020   LDLCALC 70 06/16/2020   TRIG 114 06/16/2020   CHOLHDL 3.3 06/16/2020    She has been working on diet and exercise for prediabetes, and denies paresthesia of the feet, polydipsia, polyuria and visual disturbances. Last A1C in the office was:  Lab Results  Component Value Date   HGBA1C 5.3 02/11/2020   Patient is on Vitamin D supplement.   Lab Results  Component Value Date   VD25OH 83 02/11/2020      Current Medications:     Current Outpatient Medications (Cardiovascular):  .  hydrochlorothiazide (HYDRODIURIL) 25 MG tablet, Take  1 tablet  Daily  for BP & Fluid Retention / Ankle Swelling .  metoprolol tartrate (LOPRESSOR) 50 MG tablet, Take  1 tablet  2 x /day (every 12 hours)  for BP .  rosuvastatin (CRESTOR) 10 MG tablet, Take 1 tablet (10 mg total) by mouth at bedtime.  Current Outpatient Medications (Respiratory):  .  fluticasone (FLONASE) 50 MCG/ACT nasal spray, Place 2 sprays into both nostrils at bedtime. .  lMarland Kitchenvocetirizine (XYZAL) 5 MG tablet, TAKE 1 TABLET BY MOUTH  DAILY FOR ALLERGIES .  loratadine (CLARITIN) 10 MG tablet, Take 10 mg by mouth daily. (Patient not taking: Reported on 10/13/2020)  Current Outpatient Medications (Analgesics):  .  acetaminophen (TYLENOL) 500 MG tablet, Take 1,000 mg by mouth every 6 (six) hours as needed. .  naproxen (NAPROSYN) 500 MG tablet, Take 1 tablet (500 mg total) by mouth 2 (two) times daily. (Patient taking differently: Take 500 mg by mouth as  needed.)  Current Outpatient Medications (Hematological):  .  CMarland Kitchenanocobalamin (VITAMIN B 12 PO), Take by mouth daily.  Current Outpatient Medications (Other):  .  bMarland KitchenPROPion (WELLBUTRIN XL) 300 MG 24 hr tablet, Take 1 tablet (300 mg total) by mouth every morning. .  cholecalciferol (VITAMIN D) 1000 UNITS tablet, Take 2,000 Units by mouth daily. .  famotidine (PEPCID) 40 MG tablet, Take 40 mg by mouth daily. .  gMarland Kitchenbapentin (NEURONTIN) 100 MG capsule, 1 PO q HS, may increase to 3 PO q HS if needed .  Magnesium 250 MG TABS, Take 250 mg by mouth daily. .  traZODone (DESYREL) 50 MG tablet, TAKE 1/2 TO 1 TABLET BY  MOUTH EVERY NIGHT AT  BEDTIME FOR SLEEP (Patient taking differently: at bedtime as needed. TAKE 1/2 TO  1 TABLET BY  MOUTH EVERY NIGHT AT  BEDTIME FOR SLEEP) .  TURMERIC PO, Take 500 mg by mouth. .  cyclobenzaprine (FLEXERIL) 10 MG tablet, TAKE as needed at night for headache/neck (Patient not taking: Reported on 10/13/2020)  Medical History:  Past Medical History:  Diagnosis Date  . Allergy   . Endometriosis   . GERD (gastroesophageal reflux disease)   . Hyperlipidemia   . Hypertension   . Migraine   . Prediabetes   . Vitamin D deficiency    Allergies:  Allergies  Allergen Reactions  . Penicillins     rash     Names of Other Physician/Practitioners you currently use: 1. [facility] here for primary care 2. Eye exam: Due, [place]- bilateral 2005, discussed with patient 3.Dental exam: 07/2020   Patient Care Team: Unk Pinto, MD as PCP - General (Internal Medicine) Pyrtle, Lajuan Lines, MD as Consulting Physician (Gastroenterology) Louretta Shorten, MD as Consulting Physician (Obstetrics and Gynecology)    Screening Tests: Immunization History  Administered Date(s) Administered  . PFIZER(Purple Top)SARS-COV-2 Vaccination 10/11/2019, 11/07/2019  . Tdap 07/17/2012    Preventative care: Last colonoscopy: N/A Last mammogram: 02/2020 Last pap  smear/pelvic exam: 02/2020 DEXA:N/A SARS-COV2-[employer] 06/2020   Vaccinations: TD or Tdap: 2014  Influenza: Due for 2022 Pneumococcal: N/A Prevnar13: N/A Shingles/Zostavax: N/A    Review of Systems:  Review of Systems  Constitutional: Negative.   HENT: Negative for congestion (on claritin), ear discharge, ear pain, nosebleeds and sore throat.        + TMJ, wears night guard  Respiratory: Negative.  Negative for stridor.   Cardiovascular: Negative.   Gastrointestinal: Negative.   Genitourinary: Negative.   Musculoskeletal: Positive for myalgias (left foot). Negative for back pain, falls, joint pain and neck pain.  Skin: Negative.   Neurological: Negative for dizziness, sensory change, seizures and headaches.  Psychiatric/Behavioral: Negative.     Family history- Review and unchanged Social history- Review and unchanged  Physical Exam: BP 118/80   Pulse 67   Temp (!) 97.5 F (36.4 C)   Ht 5' 5"  (1.651 m)   Wt 149 lb 9.6 oz (67.9 kg)   LMP 03/15/2015   SpO2 98%   BMI 24.89 kg/m  Wt Readings from Last 3 Encounters:  10/13/20 149 lb 9.6 oz (67.9 kg)  07/09/20 147 lb 6.4 oz (66.9 kg)  06/16/20 144 lb 8 oz (65.5 kg)   General Appearance: Well nourished, in no apparent distress. Eyes: PERRLA, EOMs, conjunctiva no swelling or erythema Sinuses: No Frontal/maxillary tenderness ENT/Mouth: Ext aud canals clear, TMs without erythema, bulging. No erythema, swelling, or exudate on post pharynx.  Tonsils not swollen or erythematous. Hearing normal.  Neck: Supple, thyroid with small nodules on the left.  Respiratory: Respiratory effort normal, BS equal bilaterally without rales, rhonchi, wheezing or stridor.  Cardio: RRR with no MRGs. Brisk peripheral pulses without edema.  Abdomen: Soft, + BS, nontender, no guarding, rebound, hernias, masses. Lymphatics: Non tender without lymphadenopathy.  Musculoskeletal:  No warmth, erythema. decreased sensation lateral right leg, negative  straight leg raise, good pulse, good reflexes, normal bilateral strength. Some pain at right buttocks with back flexion, no pain with twisting. No pain at ischial bursa, no SI pain.  Skin: dry without rashes, lesions, ecchymosis.  Bilateral feet, sensation intact.  Onychauxis. Neuro: Cranial nerves intact. Normal muscle tone, no cerebellar symptoms. Psych: Awake and oriented X 3, normal affect, Insight and Judgment appropriate.     Andrea Owens, AGNP-C,  DNP [facility] 10/13/2020  9:50 AM

## 2020-10-13 NOTE — Patient Instructions (Signed)
   Schedule an eye exam since it has been several years since an evaluation.  We placed a referral today for [facility] for assessment for your bilateral toenails.

## 2020-10-14 LAB — CBC WITH DIFFERENTIAL/PLATELET
Absolute Monocytes: 714 cells/uL (ref 200–950)
Basophils Absolute: 50 cells/uL (ref 0–200)
Basophils Relative: 0.6 %
Eosinophils Absolute: 118 cells/uL (ref 15–500)
Eosinophils Relative: 1.4 %
HCT: 40.2 % (ref 35.0–45.0)
Hemoglobin: 13.4 g/dL (ref 11.7–15.5)
Lymphs Abs: 2688 cells/uL (ref 850–3900)
MCH: 32.1 pg (ref 27.0–33.0)
MCHC: 33.3 g/dL (ref 32.0–36.0)
MCV: 96.4 fL (ref 80.0–100.0)
MPV: 14.5 fL — ABNORMAL HIGH (ref 7.5–12.5)
Monocytes Relative: 8.5 %
Neutro Abs: 4830 cells/uL (ref 1500–7800)
Neutrophils Relative %: 57.5 %
Platelets: 131 10*3/uL — ABNORMAL LOW (ref 140–400)
RBC: 4.17 10*6/uL (ref 3.80–5.10)
RDW: 11.9 % (ref 11.0–15.0)
Total Lymphocyte: 32 %
WBC: 8.4 10*3/uL (ref 3.8–10.8)

## 2020-10-14 LAB — HEMOGLOBIN A1C
Hgb A1c MFr Bld: 5.4 % of total Hgb (ref <5.7)
Mean Plasma Glucose: 108 mg/dL
eAG (mmol/L): 6 mmol/L

## 2020-10-14 LAB — COMPLETE METABOLIC PANEL WITH GFR
AG Ratio: 1.8 (calc) (ref 1.0–2.5)
ALT: 17 U/L (ref 6–29)
AST: 16 U/L (ref 10–35)
Albumin: 4.4 g/dL (ref 3.6–5.1)
Alkaline phosphatase (APISO): 51 U/L (ref 31–125)
BUN: 14 mg/dL (ref 7–25)
CO2: 30 mmol/L (ref 20–32)
Calcium: 9.7 mg/dL (ref 8.6–10.2)
Chloride: 107 mmol/L (ref 98–110)
Creat: 0.68 mg/dL (ref 0.50–1.10)
GFR, Est African American: 122 mL/min/{1.73_m2} (ref >=60)
GFR, Est Non African American: 105 mL/min/{1.73_m2} (ref >=60)
Globulin: 2.4 g/dL (calc) (ref 1.9–3.7)
Glucose, Bld: 78 mg/dL (ref 65–99)
Potassium: 3.9 mmol/L (ref 3.5–5.3)
Sodium: 144 mmol/L (ref 135–146)
Total Bilirubin: 0.4 mg/dL (ref 0.2–1.2)
Total Protein: 6.8 g/dL (ref 6.1–8.1)

## 2020-10-14 LAB — URINALYSIS W MICROSCOPIC + REFLEX CULTURE
Bacteria, UA: NONE SEEN /HPF
Bilirubin Urine: NEGATIVE
Glucose, UA: NEGATIVE
Hgb urine dipstick: NEGATIVE
Hyaline Cast: NONE SEEN /LPF
Ketones, ur: NEGATIVE
Leukocyte Esterase: NEGATIVE
Nitrites, Initial: NEGATIVE
Protein, ur: NEGATIVE
RBC / HPF: NONE SEEN /HPF (ref 0–2)
Specific Gravity, Urine: 1.01 (ref 1.001–1.03)
Squamous Epithelial / HPF: NONE SEEN /HPF (ref <=5)
WBC, UA: NONE SEEN /HPF (ref 0–5)
pH: 5.5 (ref 5.0–8.0)

## 2020-10-14 LAB — LIPID PANEL
Cholesterol: 125 mg/dL (ref <200)
HDL: 37 mg/dL — ABNORMAL LOW (ref >=50)
LDL Cholesterol (Calc): 69 mg/dL (calc)
Non-HDL Cholesterol (Calc): 88 mg/dL (calc) (ref <130)
Total CHOL/HDL Ratio: 3.4 (calc) (ref <5.0)
Triglycerides: 102 mg/dL (ref <150)

## 2020-10-14 LAB — MAGNESIUM: Magnesium: 2.1 mg/dL (ref 1.5–2.5)

## 2020-10-14 LAB — VITAMIN B12: Vitamin B-12: 1235 pg/mL — ABNORMAL HIGH (ref 200–1100)

## 2020-10-14 LAB — NO CULTURE INDICATED

## 2020-10-14 LAB — TSH: TSH: 0.57 mIU/L

## 2020-10-14 LAB — VITAMIN D 25 HYDROXY (VIT D DEFICIENCY, FRACTURES): Vit D, 25-Hydroxy: 60 ng/mL (ref 30–100)

## 2020-10-15 ENCOUNTER — Other Ambulatory Visit: Payer: Self-pay | Admitting: Adult Health Nurse Practitioner

## 2020-10-16 ENCOUNTER — Encounter: Payer: BC Managed Care – PPO | Admitting: Physician Assistant

## 2020-11-18 ENCOUNTER — Other Ambulatory Visit: Payer: Self-pay | Admitting: Internal Medicine

## 2020-11-18 DIAGNOSIS — I10 Essential (primary) hypertension: Secondary | ICD-10-CM

## 2020-11-18 DIAGNOSIS — Z0001 Encounter for general adult medical examination with abnormal findings: Secondary | ICD-10-CM

## 2020-11-26 ENCOUNTER — Encounter: Payer: Self-pay | Admitting: Podiatry

## 2020-11-26 ENCOUNTER — Ambulatory Visit: Payer: BC Managed Care – PPO | Admitting: Podiatry

## 2020-11-26 ENCOUNTER — Other Ambulatory Visit: Payer: Self-pay

## 2020-11-26 DIAGNOSIS — L989 Disorder of the skin and subcutaneous tissue, unspecified: Secondary | ICD-10-CM

## 2020-11-26 DIAGNOSIS — L600 Ingrowing nail: Secondary | ICD-10-CM

## 2020-11-26 NOTE — Progress Notes (Signed)
Subjective:  Patient ID: Andrea Owens, female    DOB: Nov 24, 1975,  MRN: [id number]  Chief Complaint  Patient presents with  . Ingrown Toenail    Bilateral ingrown and possible plantar warts     45 y.o. female presents with the above complaint.  Patient presents with right medial border hallux ingrown.  Patient states is painful to touch.  She is tried self debridement which has helped some.  She would like to have it removed.  She has not seen anyone else prior to seeing me for this.  She also has secondary complaint bilateral submetatarsal 5 porokeratosis/benign skin lesion.  She states is painful to touch.  She would like to have them debrided down as well.  She has not done any kind of removal or destructive application treatment.  She would like to discuss that as well.   Review of Systems: Negative except as noted in the HPI. Denies N/V/F/Ch.  Past Medical History:  Diagnosis Date  . Allergy   . Endometriosis   . GERD (gastroesophageal reflux disease)   . Hyperlipidemia   . Hypertension   . Migraine   . Prediabetes   . Vitamin D deficiency     Current Outpatient Medications:  .  acetaminophen (TYLENOL) 500 MG tablet, Take 1,000 mg by mouth every 6 (six) hours as needed., Disp: , Rfl:  .  buPROPion (WELLBUTRIN XL) 300 MG 24 hr tablet, Take 1 tablet (300 mg total) by mouth every morning., Disp: 90 tablet, Rfl: 3 .  cholecalciferol (VITAMIN D) 1000 UNITS tablet, Take 2,000 Units by mouth daily., Disp: , Rfl:  .  Cyanocobalamin (VITAMIN B 12 PO), Take by mouth daily., Disp: , Rfl:  .  cyclobenzaprine (FLEXERIL) 10 MG tablet, TAKE as needed at night for headache/neck (Patient not taking: Reported on 10/13/2020), Disp: 30 tablet, Rfl: 0 .  famotidine (PEPCID) 40 MG tablet, Take 40 mg by mouth daily., Disp: , Rfl:  .  fluticasone (FLONASE) 50 MCG/ACT nasal spray, Place 2 sprays into both nostrils at bedtime., Disp: 16 g, Rfl: 1 .  gabapentin (NEURONTIN) 100 MG capsule, 1 PO q HS, may  increase to 3 PO q HS if needed, Disp: 90 capsule, Rfl: 3 .  hydrochlorothiazide (HYDRODIURIL) 25 MG tablet, Take  1 tablet  Daily  for BP & Fluid Retention /Ankle Swelling, Disp: 90 tablet, Rfl: 1 .  levocetirizine (XYZAL) 5 MG tablet, TAKE 1 TABLET BY MOUTH  DAILY FOR ALLERGIES, Disp: 90 tablet, Rfl: 3 .  loratadine (CLARITIN) 10 MG tablet, Take 10 mg by mouth daily. (Patient not taking: Reported on 10/13/2020), Disp: , Rfl:  .  Magnesium 250 MG TABS, Take 250 mg by mouth daily., Disp: , Rfl:  .  metoprolol tartrate (LOPRESSOR) 50 MG tablet, Take  1 tablet  2 x /day (every 12 hours) for BP, Disp: 180 tablet, Rfl: 1 .  naproxen (NAPROSYN) 500 MG tablet, Take 1 tablet (500 mg total) by mouth 2 (two) times daily. (Patient taking differently: Take 500 mg by mouth as needed.), Disp: 30 tablet, Rfl: 0 .  rosuvastatin (CRESTOR) 10 MG tablet, TAKE 1 TABLET BY MOUTH AT  BEDTIME, Disp: 90 tablet, Rfl: 3 .  traZODone (DESYREL) 50 MG tablet, TAKE 1/2 TO 1 TABLET BY  MOUTH EVERY NIGHT AT  BEDTIME FOR SLEEP (Patient taking differently: at bedtime as needed. TAKE 1/2 TO 1 TABLET BY  MOUTH EVERY NIGHT AT  BEDTIME FOR SLEEP), Disp: 90 tablet, Rfl: 3 .  TURMERIC PO, Take  500 mg by mouth., Disp: , Rfl:   Social History   Tobacco Use  Smoking Status Current Every Day Smoker  . Packs/day: 0.50  . Years: 20.00  . Pack years: 10.00  . Types: Cigarettes  Smokeless Tobacco Never Used    Allergies  Allergen Reactions  . Penicillins     rash   Objective:  There were no vitals filed for this visit. There is no height or weight on file to calculate BMI. Constitutional Well developed. Well nourished.  Vascular Dorsalis pedis pulses palpable bilaterally. Posterior tibial pulses palpable bilaterally. Capillary refill normal to all digits.  No cyanosis or clubbing noted. Pedal hair growth normal.  Neurologic Normal speech. Oriented to person, place, and time. Epicritic sensation to light touch grossly present  bilaterally.  Dermatologic Painful ingrowing nail at medial nail borders of the hallux nail right.  Bilateral benign skin lesions noted to submetatarsal 5.  Pain on palpation.  There is central nucleated core noted. No other open wounds. No skin lesions.  Orthopedic: Normal joint ROM without pain or crepitus bilaterally. No visible deformities. No bony tenderness.   Radiographs: None Assessment:   1. Benign skin lesion   2. Ingrown right greater toenail    Plan:  Patient was evaluated and treated and all questions answered.  Bilateral submetatarsal 5 benign skin lesion/porokeratotic lesion --Lesion was debrided today without complications. Hemostasis was achieved and the area was cleaned. Cantharone was applied followed by an occlusive bandage. Post procedure complications were discussed. Monitor for signs or symptoms of infection and directed to call the office mainly should any occur. -I discussed with him generally takes about 3 application of destroy the lesion entirely.  Ingrown Nail, right -Patient elects to proceed with minor surgery to remove ingrown toenail removal today. Consent reviewed and signed by patient. -Ingrown nail excised. See procedure note. -Educated on post-procedure care including soaking. Written instructions provided and reviewed. -Patient to follow up in 2 weeks for nail check.  Procedure: Excision of Ingrown Toenail Location: Right 1st toe medial nail borders. Anesthesia: Lidocaine 1% plain; 1.5 mL and Marcaine 0.5% plain; 1.5 mL, digital block. Skin Prep: Betadine. Dressing: Silvadene; telfa; dry, sterile, compression dressing. Technique: Following skin prep, the toe was exsanguinated and a tourniquet was secured at the base of the toe. The affected nail border was freed, split with a nail splitter, and excised. Chemical matrixectomy was then performed with phenol and irrigated out with alcohol. The tourniquet was then removed and sterile dressing  applied. Disposition: Patient tolerated procedure well. Patient to return in 2 weeks for follow-up.   No follow-ups on file.

## 2020-12-10 ENCOUNTER — Ambulatory Visit: Payer: BC Managed Care – PPO | Admitting: Podiatry

## 2020-12-10 ENCOUNTER — Other Ambulatory Visit: Payer: Self-pay

## 2020-12-10 DIAGNOSIS — L600 Ingrowing nail: Secondary | ICD-10-CM | POA: Diagnosis not present

## 2020-12-10 DIAGNOSIS — L989 Disorder of the skin and subcutaneous tissue, unspecified: Secondary | ICD-10-CM | POA: Diagnosis not present

## 2020-12-12 ENCOUNTER — Encounter: Payer: Self-pay | Admitting: Podiatry

## 2020-12-12 NOTE — Progress Notes (Signed)
Subjective:  Patient ID: Andrea Owens, female    DOB: 1976/07/08,  MRN: [id number]  Chief Complaint  Patient presents with  . Ingrown Toenail    Nail check     45 y.o. female presents with the above complaint.  Patient presents now with a new complaint on left medial border ingrown.  Patient states is painful to touch she would like to have it removed.  The right side is doing really well.  She is also here for catheter therapy application as well.  She denies any other acute complaints.   Review of Systems: Negative except as noted in the HPI. Denies N/V/F/Ch.  Past Medical History:  Diagnosis Date  . Allergy   . Endometriosis   . GERD (gastroesophageal reflux disease)   . Hyperlipidemia   . Hypertension   . Migraine   . Prediabetes   . Vitamin D deficiency     Current Outpatient Medications:  .  acetaminophen (TYLENOL) 500 MG tablet, Take 1,000 mg by mouth every 6 (six) hours as needed., Disp: , Rfl:  .  buPROPion (WELLBUTRIN XL) 300 MG 24 hr tablet, Take 1 tablet (300 mg total) by mouth every morning., Disp: 90 tablet, Rfl: 3 .  cholecalciferol (VITAMIN D) 1000 UNITS tablet, Take 2,000 Units by mouth daily., Disp: , Rfl:  .  Cyanocobalamin (VITAMIN B 12 PO), Take by mouth daily., Disp: , Rfl:  .  cyclobenzaprine (FLEXERIL) 10 MG tablet, TAKE as needed at night for headache/neck (Patient not taking: Reported on 10/13/2020), Disp: 30 tablet, Rfl: 0 .  famotidine (PEPCID) 40 MG tablet, Take 40 mg by mouth daily., Disp: , Rfl:  .  fluticasone (FLONASE) 50 MCG/ACT nasal spray, Place 2 sprays into both nostrils at bedtime., Disp: 16 g, Rfl: 1 .  gabapentin (NEURONTIN) 100 MG capsule, 1 PO q HS, may increase to 3 PO q HS if needed, Disp: 90 capsule, Rfl: 3 .  hydrochlorothiazide (HYDRODIURIL) 25 MG tablet, Take  1 tablet  Daily  for BP & Fluid Retention /Ankle Swelling, Disp: 90 tablet, Rfl: 1 .  levocetirizine (XYZAL) 5 MG tablet, TAKE 1 TABLET BY MOUTH  DAILY FOR ALLERGIES, Disp: 90  tablet, Rfl: 3 .  loratadine (CLARITIN) 10 MG tablet, Take 10 mg by mouth daily. (Patient not taking: Reported on 10/13/2020), Disp: , Rfl:  .  Magnesium 250 MG TABS, Take 250 mg by mouth daily., Disp: , Rfl:  .  metoprolol tartrate (LOPRESSOR) 50 MG tablet, Take  1 tablet  2 x /day (every 12 hours) for BP, Disp: 180 tablet, Rfl: 1 .  naproxen (NAPROSYN) 500 MG tablet, Take 1 tablet (500 mg total) by mouth 2 (two) times daily. (Patient taking differently: Take 500 mg by mouth as needed.), Disp: 30 tablet, Rfl: 0 .  rosuvastatin (CRESTOR) 10 MG tablet, TAKE 1 TABLET BY MOUTH AT  BEDTIME, Disp: 90 tablet, Rfl: 3 .  traZODone (DESYREL) 50 MG tablet, TAKE 1/2 TO 1 TABLET BY  MOUTH EVERY NIGHT AT  BEDTIME FOR SLEEP (Patient taking differently: at bedtime as needed. TAKE 1/2 TO 1 TABLET BY  MOUTH EVERY NIGHT AT  BEDTIME FOR SLEEP), Disp: 90 tablet, Rfl: 3 .  TURMERIC PO, Take 500 mg by mouth., Disp: , Rfl:   Social History   Tobacco Use  Smoking Status Current Every Day Smoker  . Packs/day: 0.50  . Years: 20.00  . Pack years: 10.00  . Types: Cigarettes  Smokeless Tobacco Never Used    Allergies  Allergen  Reactions  . Penicillins     rash   Objective:  There were no vitals filed for this visit. There is no height or weight on file to calculate BMI. Constitutional Well developed. Well nourished.  Vascular Dorsalis pedis pulses palpable bilaterally. Posterior tibial pulses palpable bilaterally. Capillary refill normal to all digits.  No cyanosis or clubbing noted. Pedal hair growth normal.  Neurologic Normal speech. Oriented to person, place, and time. Epicritic sensation to light touch grossly present bilaterally.  Dermatologic Painful ingrowing nail at medial nail borders of the hallux nail left bilateral benign skin lesions noted to submetatarsal 5.  Pain on palpation.  There is central nucleated core noted. No other open wounds. No skin lesions.  Right ingrown site is adequately  healing and healing well.  No clinical signs of infection noted.  Orthopedic: Normal joint ROM without pain or crepitus bilaterally. No visible deformities. No bony tenderness.   Radiographs: None Assessment:   1. Benign skin lesion   2. Ingrown right greater toenail   3. Ingrown left big toenail    Plan:  Patient was evaluated and treated and all questions answered.  Bilateral submetatarsal 5 benign skin lesion/porokeratotic lesion~second application --Lesion was debrided today without complications. Hemostasis was achieved and the area was cleaned. Cantharone was applied followed by an occlusive bandage. Post procedure complications were discussed. Monitor for signs or symptoms of infection and directed to call the office mainly should any occur. -We will continue to apply Cantharone therapy and to resolve  Right hallux ingrown nail status post partial nail avulsion -Clinically healed  Ingrown Nail, left -Patient elects to proceed with minor surgery to remove ingrown toenail removal today. Consent reviewed and signed by patient. -Ingrown nail excised. See procedure note. -Educated on post-procedure care including soaking. Written instructions provided and reviewed. -Patient to follow up in 2 weeks for nail check.  Procedure: Excision of Ingrown Toenail Location: Left 1st toe medial nail borders. Anesthesia: Lidocaine 1% plain; 1.5 mL and Marcaine 0.5% plain; 1.5 mL, digital block. Skin Prep: Betadine. Dressing: Silvadene; telfa; dry, sterile, compression dressing. Technique: Following skin prep, the toe was exsanguinated and a tourniquet was secured at the base of the toe. The affected nail border was freed, split with a nail splitter, and excised. Chemical matrixectomy was then performed with phenol and irrigated out with alcohol. The tourniquet was then removed and sterile dressing applied. Disposition: Patient tolerated procedure well. Patient to return in 2 weeks for  follow-up.   No follow-ups on file.

## 2020-12-24 ENCOUNTER — Other Ambulatory Visit: Payer: Self-pay

## 2020-12-24 ENCOUNTER — Ambulatory Visit (INDEPENDENT_AMBULATORY_CARE_PROVIDER_SITE_OTHER): Payer: BC Managed Care – PPO

## 2020-12-24 ENCOUNTER — Ambulatory Visit: Payer: BC Managed Care – PPO | Admitting: Podiatry

## 2020-12-24 DIAGNOSIS — M216X2 Other acquired deformities of left foot: Secondary | ICD-10-CM

## 2020-12-24 DIAGNOSIS — Z01818 Encounter for other preprocedural examination: Secondary | ICD-10-CM

## 2020-12-26 ENCOUNTER — Encounter: Payer: Self-pay | Admitting: Podiatry

## 2020-12-26 NOTE — Progress Notes (Signed)
Subjective:  Patient ID: Andrea Owens, female    DOB: February 24, 1976,  MRN: [id number]  Chief Complaint  Patient presents with   Plantar Warts    Nail check and plantar warts follow up     45 y.o. female presents with the above complaint.  Patient presents with continuous pain to the left submetatarsal 5 skin lesion.  Patient has failed multiple Cantharone therapy application as well as aggressive debridement of the lesion.  She states that she is doing okay.  She still hurts with ambulation.  She would like to discuss other treatment options.  Cantharone therapy the excision and injection none of which has helped.  She denies any other issues.  She is tried shoe gear modification and has failed all conservative treatment options she would like to discuss surgical options at this time.   Review of Systems: Negative except as noted in the HPI. Denies N/V/F/Ch.  Past Medical History:  Diagnosis Date   Allergy    Endometriosis    GERD (gastroesophageal reflux disease)    Hyperlipidemia    Hypertension    Migraine    Prediabetes    Vitamin D deficiency     Current Outpatient Medications:    acetaminophen (TYLENOL) 500 MG tablet, Take 1,000 mg by mouth every 6 (six) hours as needed., Disp: , Rfl:    buPROPion (WELLBUTRIN XL) 300 MG 24 hr tablet, Take 1 tablet (300 mg total) by mouth every morning., Disp: 90 tablet, Rfl: 3   cholecalciferol (VITAMIN D) 1000 UNITS tablet, Take 2,000 Units by mouth daily., Disp: , Rfl:    Cyanocobalamin (VITAMIN B 12 PO), Take by mouth daily., Disp: , Rfl:    cyclobenzaprine (FLEXERIL) 10 MG tablet, TAKE as needed at night for headache/neck (Patient not taking: Reported on 10/13/2020), Disp: 30 tablet, Rfl: 0   famotidine (PEPCID) 40 MG tablet, Take 40 mg by mouth daily., Disp: , Rfl:    fluticasone (FLONASE) 50 MCG/ACT nasal spray, Place 2 sprays into both nostrils at bedtime., Disp: 16 g, Rfl: 1   gabapentin (NEURONTIN) 100 MG capsule, 1 PO q HS, may increase  to 3 PO q HS if needed, Disp: 90 capsule, Rfl: 3   hydrochlorothiazide (HYDRODIURIL) 25 MG tablet, Take  1 tablet  Daily  for BP & Fluid Retention /Ankle Swelling, Disp: 90 tablet, Rfl: 1   levocetirizine (XYZAL) 5 MG tablet, TAKE 1 TABLET BY MOUTH  DAILY FOR ALLERGIES, Disp: 90 tablet, Rfl: 3   loratadine (CLARITIN) 10 MG tablet, Take 10 mg by mouth daily. (Patient not taking: Reported on 10/13/2020), Disp: , Rfl:    Magnesium 250 MG TABS, Take 250 mg by mouth daily., Disp: , Rfl:    metoprolol tartrate (LOPRESSOR) 50 MG tablet, Take  1 tablet  2 x /day (every 12 hours) for BP, Disp: 180 tablet, Rfl: 1   naproxen (NAPROSYN) 500 MG tablet, Take 1 tablet (500 mg total) by mouth 2 (two) times daily. (Patient taking differently: Take 500 mg by mouth as needed.), Disp: 30 tablet, Rfl: 0   rosuvastatin (CRESTOR) 10 MG tablet, TAKE 1 TABLET BY MOUTH AT  BEDTIME, Disp: 90 tablet, Rfl: 3   traZODone (DESYREL) 50 MG tablet, TAKE 1/2 TO 1 TABLET BY  MOUTH EVERY NIGHT AT  BEDTIME FOR SLEEP (Patient taking differently: at bedtime as needed. TAKE 1/2 TO 1 TABLET BY  MOUTH EVERY NIGHT AT  BEDTIME FOR SLEEP), Disp: 90 tablet, Rfl: 3   TURMERIC PO, Take 500 mg by mouth.,  Disp: , Rfl:   Social History   Tobacco Use  Smoking Status Every Day   Packs/day: 0.50   Years: 20.00   Pack years: 10.00   Types: Cigarettes  Smokeless Tobacco Never    Allergies  Allergen Reactions   Penicillins     rash   Objective:  There were no vitals filed for this visit. There is no height or weight on file to calculate BMI. Constitutional Well developed. Well nourished.  Vascular Dorsalis pedis pulses palpable bilaterally. Posterior tibial pulses palpable bilaterally. Capillary refill normal to all digits.  No cyanosis or clubbing noted. Pedal hair growth normal.  Neurologic Normal speech. Oriented to person, place, and time. Epicritic sensation to light touch grossly present bilaterally.  Dermatologic Nails well  groomed and normal in appearance. No open wounds. No skin lesions.  Orthopedic: Pain on palpation of left submetatarsal 5 lesion.No pain with range of motion of the MPJ joint.  Mild tailor's bunion noted.  Plantarflexed left fifth metatarsal noted   Radiographs: 3 views of skeletally mature adult left foot: Kate's bunion noted with increase in lateral deviation angle.  IM angle within normal limits.  No other bony abnormalities identified.  No arthritic changes noted. Assessment:   1. 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 Preoperative examination    Plan:  Patient was evaluated and treated and all questions answered.  Left fifth plantarflexed metatarsal with underlying porokeratosis -I explained the patient the etiology of plantarflexed metatarsal and various treatment options were extensively discussed.  Given the amount of pain that she is having in submetatarsal 5 without any resolving with conservative treatment options at this time I discussed surgical options with floating osteotomy of the fifth.  I encouraged her that she can start ambulating right away after the surgery and allow the bone to find his own place and heal.  I discussed this with the patient in extensive detail she states understanding like to proceed with the surgery. -She will be weightbearing as tolerated with surgical shoe after the surgery. -Informed surgical risk consent was reviewed and read aloud to the patient.  I reviewed the films.  I have discussed my findings with the patient in great detail.  I have discussed all risks including but not limited to infection, stiffness, scarring, limp, disability, deformity, damage to blood vessels and nerves, numbness, poor healing, need for braces, arthritis, chronic pain, amputation, death.  All benefits and realistic expectations discussed in great detail.  I have made no promises as to the outcome.  I have provided realistic expectations.  I have offered the patient  a 2nd opinion, which they have declined and assured me they preferred to proceed despite the risks   No follow-ups on file.

## 2020-12-31 ENCOUNTER — Other Ambulatory Visit: Payer: Self-pay | Admitting: Internal Medicine

## 2020-12-31 ENCOUNTER — Other Ambulatory Visit: Payer: Self-pay | Admitting: Adult Health

## 2020-12-31 DIAGNOSIS — F4321 Adjustment disorder with depressed mood: Secondary | ICD-10-CM

## 2020-12-31 MED ORDER — BUPROPION HCL ER (XL) 300 MG PO TB24: ORAL_TABLET | ORAL | 3 refills | Status: DC

## 2021-01-01 ENCOUNTER — Other Ambulatory Visit: Payer: Self-pay

## 2021-01-01 MED ORDER — GABAPENTIN 100 MG PO CAPS: 100.0000 mg | ORAL_CAPSULE | Freq: Three times a day (TID) | ORAL | 3 refills | Status: DC

## 2021-01-02 ENCOUNTER — Other Ambulatory Visit: Payer: Self-pay | Admitting: Internal Medicine

## 2021-01-02 ENCOUNTER — Telehealth: Payer: Self-pay | Admitting: Urology

## 2021-01-02 DIAGNOSIS — F4321 Adjustment disorder with depressed mood: Secondary | ICD-10-CM

## 2021-01-02 MED ORDER — BUPROPION HCL ER (XL) 300 MG PO TB24: ORAL_TABLET | ORAL | 3 refills | Status: DC

## 2021-01-02 NOTE — Telephone Encounter (Signed)
DOS - 01/23/21  METATARSAL OSTEOTOMY 5TH LEFT --- 53005   [employer] EFFECTIVE DATE - 07/12/20   PLAN DEDUCTIBLE - $1,750.00 W/ $1,750.00 REMAINING OUT OF POCKET - $6,000.00 W/ $5,712.70 REMAINING COINSURANCE - 20%  COPAY - $0.00    NO PRIOR AUTH REQUIRED

## 2021-01-22 ENCOUNTER — Telehealth: Payer: Self-pay

## 2021-01-22 NOTE — Telephone Encounter (Signed)
Andrea Owens called to cancel her surgery with Dr. Posey Pronto on 01/23/2021. She stated she had a family emergency and will have to call back and reschedule. I notified Dr. Posey Pronto and Caren Griffins with [facility].

## 2021-01-30 ENCOUNTER — Encounter: Payer: BC Managed Care – PPO | Admitting: Podiatry

## 2021-02-11 ENCOUNTER — Ambulatory Visit: Payer: BC Managed Care – PPO | Admitting: Adult Health

## 2021-02-11 ENCOUNTER — Other Ambulatory Visit: Payer: Self-pay

## 2021-02-11 ENCOUNTER — Encounter: Payer: Self-pay | Admitting: Adult Health

## 2021-02-11 VITALS — BP 136/76 | HR 66 | Temp 97.5°F | Ht 65.0 in | Wt 148.0 lb

## 2021-02-11 DIAGNOSIS — M778 Other enthesopathies, not elsewhere classified: Secondary | ICD-10-CM

## 2021-02-11 MED ORDER — METHYLPREDNISOLONE 4 MG PO TBPK: ORAL_TABLET | ORAL | 0 refills | Status: DC
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
## 2021-02-11 NOTE — Patient Instructions (Signed)
Tendinitis  Tendinitis is inflammation of a tendon. A tendon is a strong cord of tissuethat connects muscle to bone. Tendinitis can affect any tendon, but it most commonly affects the: Shoulder tendon (rotator cuff). Ankle tendon (Achilles tendon). Elbow tendon (triceps tendon). Tendons in the wrist. What are the causes? This condition may be caused by: Overusing a tendon or muscle. This is common. Age-related wear and tear. Injury. Inflammatory conditions, such as arthritis. Certain medicines. What increases the risk? You are more likely to develop this condition if you do activities that involve the same movements over and over again (repetitive motions). What are the signs or symptoms? Symptoms of this condition may include: Pain. Tenderness. Mild swelling. Decreased range of motion. How is this diagnosed? This condition is diagnosed with a physical exam. You may also have tests, such as: Ultrasound. This uses sound waves to make an image of the inside of your body in the affected area. MRI. How is this treated? This condition may be treated by resting, icing, applying pressure (compression), and raising (elevating) the affected area above the level of your heart. This is known as RICE therapy. Treatment may also include: Medicines to help reduce inflammation or to help reduce pain. Exercises or physical therapy to strengthen and stretch the tendon. A brace or splint. Surgery. This is rarely needed. Follow these instructions at home: If you have a splint or brace: Wear the splint or brace as told by your health care provider. Remove it only as told by your health care provider. Loosen the splint or brace if your fingers or toes tingle, become numb, or turn cold and blue. Keep the splint or brace clean. If the splint or brace is not waterproof: Do not let it get wet. Cover it with a watertight covering when you take a bath or shower. Managing pain, stiffness, and  swelling If directed, put ice on the affected area. If you have a removable splint or brace, remove it as told by your health care provider. Put ice in a plastic bag. Place a towel between your skin and the bag. Leave the ice on for 20 minutes, 2-3 times a day. Move the fingers or toes of the affected limb often, if this applies. This can help to prevent stiffness and lessen swelling. If directed, raise (elevate) the affected area above the level of your heart while you are sitting or lying down. If directed, apply heat to the affected area before you exercise. Use the heat source that your health care provider recommends, such as a moist heat pack or a heating pad.     Place a towel between your skin and the heat source. Leave the heat on for 20-30 minutes. Remove the heat if your skin turns bright red. This is especially important if you are unable to feel pain, heat, or cold. You may have a greater risk of getting burned.  Driving Do not drive or use heavy machinery while taking prescription pain medicine. Ask your health care provider when it is safe to drive if you have a splint or brace on any part of your arm or leg. Activity Rest the affected area as told by your health care provider. Return to your normal activities as told by your health care provider. Ask your health care provider what activities are safe for you. Avoid using the affected area while you are experiencing symptoms of tendinitis. Do exercises as told by your health care provider. General instructions If you have a splint,  do not put pressure on any part of the splint until it is fully hardened. This may take several hours. Wear an elastic bandage or compression wrap only as told by your health care provider. Take over-the-counter and prescription medicines only as told by your health care provider. Keep all follow-up visits as told by your health care provider. This is important. Contact a health care provider  if: Your symptoms do not improve. You develop new, unexplained problems, such as numbness in your hands. Summary Tendinitis is inflammation of a tendon. You are more likely to develop this condition if you do activities that involve the same movements over and over again. This condition may be treated by resting, icing, applying pressure (compression), and elevating the area above the level of your heart. This is known as RICE therapy. Avoid using the affected area while you are experiencing symptoms of tendinitis. This information is not intended to replace advice given to you by your health care provider. Make sure you discuss any questions you have with your healthcare provider. Document Revised: 01/03/2018 Document Reviewed: 11/16/2017 Elsevier Patient Education  [place].

## 2021-02-13 ENCOUNTER — Encounter: Payer: BC Managed Care – PPO | Admitting: Podiatry

## 2021-03-04 ENCOUNTER — Other Ambulatory Visit: Payer: Self-pay | Admitting: Adult Health

## 2021-04-09 DIAGNOSIS — I7389 Other specified peripheral vascular diseases: Secondary | ICD-10-CM | POA: Diagnosis not present

## 2021-04-09 DIAGNOSIS — Z6825 Body mass index (BMI) 25.0-25.9, adult: Secondary | ICD-10-CM | POA: Diagnosis not present

## 2021-04-09 DIAGNOSIS — Z1231 Encounter for screening mammogram for malignant neoplasm of breast: Secondary | ICD-10-CM | POA: Diagnosis not present

## 2021-04-09 DIAGNOSIS — Z01419 Encounter for gynecological examination (general) (routine) without abnormal findings: Secondary | ICD-10-CM | POA: Diagnosis not present

## 2021-04-09 DIAGNOSIS — K589 Irritable bowel syndrome without diarrhea: Secondary | ICD-10-CM | POA: Insufficient documentation

## 2021-05-04 ENCOUNTER — Other Ambulatory Visit: Payer: Self-pay | Admitting: Internal Medicine

## 2021-05-04 DIAGNOSIS — I10 Essential (primary) hypertension: Secondary | ICD-10-CM

## 2021-05-04 DIAGNOSIS — Z0001 Encounter for general adult medical examination with abnormal findings: Secondary | ICD-10-CM

## 2021-06-02 DIAGNOSIS — N390 Urinary tract infection, site not specified: Secondary | ICD-10-CM | POA: Diagnosis not present

## 2021-09-08 NOTE — Progress Notes (Signed)
? ? ?Future Appointments  ?Date Time Provider Department  ?09/09/2021  9:30 AM Unk Pinto, MD [facility]  ?10/13/2021  9:00 AM Magda Bernheim, NP [facility]  ? ? ?History of Present Illness: ? ?   Patient is a very nice 46 yo  W F  with HTN, HLD, PreDM, Vit D Deficiency who presents with concerns re: possible gallbladder issues. GB U/S   in 2015 was essentially Normal with a 4 mm polyp. Patient relates 2 month hx/o Right sided Abdominal pain, worse last 2 weeks w/assoc nausea.  Rates pain at worst at 5-6/10. No diarrhea. Occasional dysuria. Denies fever, chills, sweats, rash, change in color of urine. No weight loss.  ? ? ? ?Medications ? ?  hydrochlorothiazide 25 MG tablet, TAKE 1 TABLET  DAILY  ?  metoprolol tartrate 50 MG tablet, TAKE 1 TABLET 2  TIMES DAILY  ?  rosuvastatin 10 MG tablet, TAKE 1 TABLET  AT  BEDTIME  ?  FLONASE nasal spray, Place 2 sprays into both nostrils at bedtime. ?  levocetirizine (XYZAL) 5 MG tablet, TAKE 1 TABLET   DAILY FOR ALLERGIES ?  acetaminophen  500 MG tablet, Take 1,000 mg  every 6  hours as needed. ?  naproxen  500 MG tablet, Take 1 tablet 2  times daily as needed. ?  VITAMIN B 12  tab - Take  daily. ?  buPROPion XL 300 MG , Take  1 tablet  every Morning   ?  VITAMIN D, Take 2,000 Units  daily. ?  famotidine  40 MG tablet, Take 40 mg daily. ?  gabapentin 100 MG capsule, TAKE 1 CAPSULE  3  TIMES DAILY ?  Magnesium 250 MG TABS, Take  daily. ?  TURMERIC , Take 500 mg daily ? ?Problem list ?She has Hypertension; Hyperlipidemia; GERD (gastroesophageal reflux disease); Vitamin D deficiency; Gallbladder polyp; Migraine; Lower back pain; Tobacco use disorder; Medication management; and Elevated lipoprotein(a) on their problem list. ?  ?Observations/Objective: ? ?BP 124/76   Pulse 62   Temp 97.9 ?F (36.6 ?C)   Resp 16   Ht 5' 5"  (1.651 m)   Wt 144 lb (65.3 kg)   LMP 03/15/2015   SpO2 99%   BMI 23.96 kg/m?  ? ?HEENT - WNL. ?Neck - supple.  ?Chest -  Bilateral medium & coarse  wheezes.  ?Cor - Nl HS. RRR w/o sig MGR. PP 1(+). No edema. ?MS- FROM w/o deformities.  Gait Nl. ?Abd - Soft, flat. Nl BS. Deep tenderness in RLQ > RUQ. No guarding or rebound.  ?Neuro -  Nl w/o focal abnormalities. ? ? ?Assessment and Plan: ? ? ?1. Right upper quadrant abdominal pain ? ?- CBC with Differential/Platelet ?- COMPLETE METABOLIC PANEL WITH GFR ?- Urinalysis, Routine w reflex microscopic ?- Urine Culture ?- Lipase ?- Amylase ?- US Abdomen Limited RUQ (LIVER/GB); Future ?- CT Abdomen Pelvis W Contrast; Future ? ?2. Right lower quadrant abdominal pain ? ?- CBC with Differential/Platelet ?- Urinalysis, Routine w reflex microscopic ?- Urine Culture ?- US Abdomen Limited RUQ (LIVER/GB); Future ?- CT Abdomen Pelvis W Contrast; Future ? ?- late entry: All labs above returned Normal as did U/A - U/C still pending . ? ?- Abdominal U/S showed mild thickening of multiple regions of  ?the gallbladder wall suspect for benign adenomyomatosis.  ? ?- Abdominal /pelvic CT scan - WNL ? ?- Suspect an element of IBS is responsible for patients sx's & will send in Rx Bentyl to try for her sx's. ? ? ?  Follow Up Instructions: ? ?  ?    I discussed the assessment and treatment plan with the patient. The patient was provided an opportunity to ask questions and all were answered. The patient agreed with the plan and demonstrated an understanding of the instructions. ?  ?    The patient was advised to call back or seek an in-person evaluation if the symptoms worsen or if the condition fails to improve as anticipated. ? ? ? ?Kirtland Bouchard, MD ? ?

## 2021-09-09 ENCOUNTER — Encounter: Payer: Self-pay | Admitting: Internal Medicine

## 2021-09-09 ENCOUNTER — Encounter (HOSPITAL_BASED_OUTPATIENT_CLINIC_OR_DEPARTMENT_OTHER): Payer: Self-pay

## 2021-09-09 ENCOUNTER — Ambulatory Visit: Payer: BC Managed Care – PPO | Admitting: Internal Medicine

## 2021-09-09 ENCOUNTER — Ambulatory Visit (HOSPITAL_BASED_OUTPATIENT_CLINIC_OR_DEPARTMENT_OTHER)
Admission: RE | Admit: 2021-09-09 | Discharge: 2021-09-09 | Disposition: A | Discharge status: Home / Self Care | Payer: BC Managed Care – PPO | Source: Ambulatory Visit | Attending: Internal Medicine | Admitting: Internal Medicine

## 2021-09-09 ENCOUNTER — Ambulatory Visit (HOSPITAL_BASED_OUTPATIENT_CLINIC_OR_DEPARTMENT_OTHER): Payer: BC Managed Care – PPO

## 2021-09-09 ENCOUNTER — Other Ambulatory Visit: Payer: Self-pay

## 2021-09-09 VITALS — BP 124/76 | HR 62 | Temp 97.9°F | Resp 16 | Ht 65.0 in | Wt 144.0 lb

## 2021-09-09 DIAGNOSIS — J4530 Mild persistent asthma, uncomplicated: Secondary | ICD-10-CM | POA: Diagnosis not present

## 2021-09-09 DIAGNOSIS — R1011 Right upper quadrant pain: Secondary | ICD-10-CM | POA: Diagnosis not present

## 2021-09-09 DIAGNOSIS — R1031 Right lower quadrant pain: Secondary | ICD-10-CM | POA: Insufficient documentation

## 2021-09-09 DIAGNOSIS — I700 Atherosclerosis of aorta: Secondary | ICD-10-CM | POA: Insufficient documentation

## 2021-09-09 LAB — POCT I-STAT CREATININE: Creatinine, Ser: 0.7 mg/dL (ref 0.44–1.00)

## 2021-09-09 MED ORDER — DEXAMETHASONE 4 MG PO TABS: ORAL_TABLET | ORAL | 0 refills | Status: DC

## 2021-09-09 MED ORDER — DICYCLOMINE HCL 20 MG PO TABS: ORAL_TABLET | ORAL | 1 refills | Status: DC

## 2021-09-09 MED ORDER — IOHEXOL 300 MG/ML  SOLN
100.0000 mL | Freq: Once | INTRAMUSCULAR | Status: AC | PRN
  Administered 2021-09-09: 85 mL via INTRAVENOUS

## 2021-09-09 NOTE — Progress Notes (Signed)
Ultrasound of Abdomen is normal.& OK

## 2021-09-09 NOTE — Progress Notes (Signed)
=============================================================== ?-   Test results slightly outside the reference range are not unusual. ?If there is anything important, I will review this with you,  ?otherwise it is considered normal test values.  ?If you have further questions,  ?please do not hesitate to contact me at the office or via My Chart.  ?=============================================================== ?=============================================================== ? ?-  All labs returned Normal & OK   ? ?=============================================================== ?=============================================================== ?

## 2021-09-09 NOTE — Progress Notes (Signed)
Abdominal CT scan is Normal &    OK

## 2021-09-10 LAB — URINE CULTURE
MICRO NUMBER:: 13073695
Result:: NO GROWTH
SPECIMEN QUALITY:: ADEQUATE

## 2021-09-10 LAB — URINALYSIS, ROUTINE W REFLEX MICROSCOPIC
Bilirubin Urine: NEGATIVE
Glucose, UA: NEGATIVE
Hgb urine dipstick: NEGATIVE
Ketones, ur: NEGATIVE
Leukocytes,Ua: NEGATIVE
Nitrite: NEGATIVE
Protein, ur: NEGATIVE
Specific Gravity, Urine: 1.004 (ref 1.001–1.035)
pH: 7 (ref 5.0–8.0)

## 2021-09-10 LAB — COMPLETE METABOLIC PANEL WITH GFR
AG Ratio: 2.1 (calc) (ref 1.0–2.5)
ALT: 29 U/L (ref 6–29)
AST: 24 U/L (ref 10–35)
Albumin: 4.7 g/dL (ref 3.6–5.1)
Alkaline phosphatase (APISO): 53 U/L (ref 31–125)
BUN: 9 mg/dL (ref 7–25)
CO2: 31 mmol/L (ref 20–32)
Calcium: 9.5 mg/dL (ref 8.6–10.2)
Chloride: 104 mmol/L (ref 98–110)
Creat: 0.74 mg/dL (ref 0.50–0.99)
Globulin: 2.2 g/dL (calc) (ref 1.9–3.7)
Glucose, Bld: 71 mg/dL (ref 65–99)
Potassium: 4.1 mmol/L (ref 3.5–5.3)
Sodium: 142 mmol/L (ref 135–146)
Total Bilirubin: 0.6 mg/dL (ref 0.2–1.2)
Total Protein: 6.9 g/dL (ref 6.1–8.1)
eGFR: 100 mL/min/{1.73_m2} (ref >=60)

## 2021-09-10 LAB — AMYLASE: Amylase: 26 U/L (ref 21–101)

## 2021-09-10 LAB — LIPASE: Lipase: 15 U/L (ref 7–60)

## 2021-09-10 LAB — CBC WITH DIFFERENTIAL/PLATELET
Absolute Monocytes: 605 cells/uL (ref 200–950)
Basophils Absolute: 27 cells/uL (ref 0–200)
Basophils Relative: 0.4 %
Eosinophils Absolute: 68 cells/uL (ref 15–500)
Eosinophils Relative: 1 %
HCT: 41.9 % (ref 35.0–45.0)
Hemoglobin: 14.2 g/dL (ref 11.7–15.5)
Lymphs Abs: 2774 cells/uL (ref 850–3900)
MCH: 32.6 pg (ref 27.0–33.0)
MCHC: 33.9 g/dL (ref 32.0–36.0)
MCV: 96.3 fL (ref 80.0–100.0)
MPV: 13.9 fL — ABNORMAL HIGH (ref 7.5–12.5)
Monocytes Relative: 8.9 %
Neutro Abs: 3325 cells/uL (ref 1500–7800)
Neutrophils Relative %: 48.9 %
Platelets: 102 10*3/uL — ABNORMAL LOW (ref 140–400)
RBC: 4.35 10*6/uL (ref 3.80–5.10)
RDW: 11.9 % (ref 11.0–15.0)
Total Lymphocyte: 40.8 %
WBC: 6.8 10*3/uL (ref 3.8–10.8)

## 2021-09-17 ENCOUNTER — Other Ambulatory Visit: Payer: Self-pay | Admitting: Adult Health

## 2021-09-23 ENCOUNTER — Encounter: Payer: Self-pay | Admitting: Internal Medicine

## 2021-09-23 ENCOUNTER — Other Ambulatory Visit: Payer: Self-pay

## 2021-09-23 ENCOUNTER — Ambulatory Visit (INDEPENDENT_AMBULATORY_CARE_PROVIDER_SITE_OTHER): Payer: BC Managed Care – PPO | Admitting: Internal Medicine

## 2021-09-23 VITALS — BP 120/76 | HR 65 | Temp 97.9°F | Resp 16 | Ht 65.0 in | Wt 151.0 lb

## 2021-09-23 DIAGNOSIS — K589 Irritable bowel syndrome without diarrhea: Secondary | ICD-10-CM

## 2021-09-23 MED ORDER — FEXOFENADINE-PSEUDOEPHED ER 180-240 MG PO TB24
ORAL_TABLET | ORAL | 1 refills | Status: DC
Start: 2021-09-23 — End: 2022-10-14

## 2021-09-23 NOTE — Progress Notes (Signed)
? ? ?  Future Appointments  ?Date Time Provider Department  ?09/23/2021 11:30 AM Unk Pinto, MD [facility]  ?10/13/2021  9:00 AM Magda Bernheim, NP [facility]  ? ? ?History of Present Illness: ? ?    Patient is a very nice 46 yo  [place] who returns for 1 month f/u after evaluation of abd pain found Negative & Nl GB U/S and Abd CT scan,CBC, CMET, Amylase [place] Dura & U/C were all negative & patient was treated with Dicyclomine and is taking 1/2 tablet daily. Also reports trying to increase fiber in diet.  ? ?Medications ? ?  hydrochlorothiazide (HYDRODIURIL) 25 MG tablet, TAKE 1 TABLET  DAILY FOR BLOOD PRESSURE  AND FLUID RETENTION, ANKLE  SWELLING ?  metoprolol tartrate (LOPRESSOR) 50 MG tablet, TAKE 1 TABLET  2  TIMES DAILY (EVERY 12  HOURS) FOR BLOOD PRESSURE ?  rosuvastatin (CRESTOR) 10 MG tablet, TAKE 1 TABLET AT  BEDTIME ?  fluticasone (FLONASE) 50 MCG/ACT nasal spray, Place 2 sprays into both nostrils at bedtime. ?  levocetirizine (XYZAL) 5 MG tablet, TAKE 1 TABLET   DAILY FOR ALLERGIES ?  acetaminophen (TYLENOL) 500 MG tablet, Take 1,000 mg every 6 (six) hours as needed. ?  Cyanocobalamin (VITAMIN B 12 PO), Take  daily. ?  buPROPion (WELLBUTRIN XL) 300 MG 24 hr tablet, Take  1 tablet  every Morning  for Mood, Focus & Concentration ?  VITAMIN D 1000 UNITS tablet, Take 2,000 Units daily. ?  dicyclomine (BENTYL) 20 MG tablet, Take  1/2 to 1 tablet  3 x /day  before Meals for Abdominal Discomfort / Cramping ?  famotidine (PEPCID) 40 MG tablet, Take 40 mg  daily. ?  gabapentin (NEURONTIN) 100 MG capsule, TAKE 1 CAPSULE 3  TIMES DAILY ?  Magnesium 250 MG TABS, Take 250 mg  daily. ?  TURMERIC PO, Take 500 mg  ? ?Problem list ?She has Hypertension; Hyperlipidemia; GERD (gastroesophageal reflux disease); Vitamin D deficiency; Gallbladder polyp; Migraine; Lower back pain; Tobacco use disorder; Medication management; Elevated lipoprotein(a); and Aortic atherosclerosis (HCC) by Abd CT scan on 03.01.2023 on their problem  list. ?  ?Observations/Objective: ? ?BP 120/76   Pulse 65   Temp 97.9 ?F (36.6 ?C)   Resp 16   Ht 5' 5"  (1.651 m)   Wt 151 lb (68.5 kg)   LMP 03/15/2015   SpO2 99%   BMI 25.13 kg/m?  ? ?HEENT - WNL. ?Neck - supple.  ?Chest - Clear equal BS. ?Cor - Nl HS. RRR w/o sig MGR. PP 1(+). No edema. ?Abd - Soft , benign , non-tender w/o palpable masses or tenderness.  ?MS- FROM w/o deformities.  Gait Nl. ?Neuro -  Nl w/o focal abnormalities. ? ?Assessment and Plan: ? ? ?1. Irritable bowel syndrome ? ?- continue diet & meds same  ? ? ? ?Follow Up Instructions: ? ?    I discussed the assessment and treatment plan with the patient. The patient was provided an opportunity to ask questions and all were answered. The patient agreed with the plan and demonstrated an understanding of the instructions. ?  ? ?    The patient was advised to call back or seek an in-person evaluation if the symptoms worsen or if the condition fails to improve as anticipated. ? ? ?Kirtland Bouchard, MD ? ?

## 2021-10-12 NOTE — Progress Notes (Signed)
COMPLETE PHYSICAL ? ? ?Assessment and Plan:  ? ?Andrea Owens was seen today for annual exam. ? ?Diagnoses and all orders for this visit: ? ?Encounter for routine adult medical exam with abnormal findings ?Yearly ? ?Essential hypertension ?Continue current medications: Metoprolol 46m BID and HCTZ 219mdaily in am. ?Monitor blood pressure at home; call if consistently over 130/80 ?Continue DASH diet.   ?Reminder to go to the ER if any CP, SOB, nausea, dizziness, severe HA, changes vision/speech, left arm numbness and tingling and jaw pain. ?-     CBC with Differential/Platelet ?-     COMPLETE METABOLIC PANEL WITH GFR ?-     TSH ?-     Magnesium ?-     EKG 12-Lead ? ?Hyperlipidemia, unspecified hyperlipidemia type ?Elevated lipoprotein(a) ?Family history of early CAD ?Family history MI at 5644Cardiac calcium score at age 67110?Discussed with patient ?-     Lipid panel ? ?Tobacco use disorder ?Half a pack a day, she is doing a program through work-"quit for life" ?Discussed smoking cessation ?Not ready to quit at this time ? ?Abnormal glucose ?Discussed dietary and exercise modifications ?-     Hemoglobin A1c ? ?Vitamin D deficiency ?Continue supplementation to maintain goal of 70-100 ?Taking Vitamin D 2,000 IU daily ?-     VITAMIN D 25 Hydroxy (Vit-D Deficiency, Fractures) ? ?Gallbladder polyp ?Monitor ? ?Chronic midline low back pain with right-sided sciatica ?Continue follow up with Dr Andrea Owens ?Medication management ?Continued ? ?Other migraine without status migrainosus, not intractable ?Doing well at this time ? ?Environmental allergies ?Continue Allegra D and Flonase ?Add Singulair ?Dexamethasone 10 mg IM given in office today.  ? ?Screening, ischemic heart disease ?-EKG ? ?Colon cancer screening ?- Cologuard ? ?Hot flashes/night sweats ?- FSH ?- LH ? ?Screening for blood or protein in urine ?-     Urinalysis routine with reflex microscopic ?-  Microalbumin/creatinine urine ratio ? ? ? ? ?Continue diet and meds as  discussed. Further disposition pending results of labs. ?Over 30 minutes of face to face interview, exam, counseling, chart review, and critical decision making was performed ? ?Future Appointments  ?Date Time Provider [place]?10/14/2022  9:00 AM Andrea Owens, Andrea RogerNP [facility] None  ? ? ? ?HPI ?[age].o. female  presents for 6 month follow up on HTN, HLD, prediabetes/abnormal glucose, and vitamin D deficiency.  ? ?Overall she is doing well.   ? ?Her father is sick and she is helping to take care of him. He has brain cancer. He lives 5 minutes away. She does wish to spend as much time as possible with her father so does not wish to pursue colonoscopy at current time.  Will do cologuard. ? ? ?She battles seasonal allergies and taking Xyzal.  She is also on Allegra D and Flonase. She reports she is having significant nasal congestion . ? ?She does see Dr. PaPosey Owens bilateral feet pain L> R. ? ? Her blood pressure has been controlled at home, she is on HCTZ 2584maily and metoprolol 2m38mD, today their BP is BP: 140/90   ?BP Readings from Last 3 Encounters:  ?10/13/21 140/90  ?09/23/21 120/76  ?09/09/21 124/76  ? ? ?BMI is Body mass index is 25.15 kg/m?., she is working on diet and exercise. ?Wt Readings from Last 3 Encounters:  ?10/13/21 148 lb 12.8 oz (67.5 kg)  ?09/23/21 151 lb (68.5 kg)  ?09/09/21 144 lb (65.3 kg)  ? ? She does workout, she  is walking. She denies chest pain, shortness of breath, dizziness. ?  ?She continues to smoke, about a 1/2 a pack a day. She has tried chantix and did not like the feeling.  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 She is on the 16m daily. ?  ? She is on cholesterol medication and denies myalgias, on Rosuvastatin 125mnightly. Her cholesterol is at goal. The cholesterol last visit was:   ?Lab Results  ?Component Value Date  ? CHOL 125 10/13/2020  ? HDL 37 (L) 10/13/2020  ? LDO[place]9 10/13/2020  ? TRIG 102 10/13/2020  ?  CHOLHDL 3.4 10/13/2020  ? ? She has been working on diet and exercise for prediabetes, and denies paresthesia of the feet, polydipsia, polyuria and visual disturbances. Last A1C in the office was:  ?Lab Results  ?Component Value Date  ? HGBA1C 5.4 10/13/2020  ? ?Patient is on Vitamin D supplement.   ?Lab Results  ?Component Value Date  ? VD25OH 60 10/13/2020  ?   ? ?Current Medications:  ? ? ? ?Current Outpatient Medications (Cardiovascular):  ?  hydrochlorothiazide (HYDRODIURIL) 25 MG tablet, TAKE 1 TABLET BY MOUTH  DAILY FOR BLOOD PRESSURE  AND FLUID RETENTION, ANKLE  SWELLING ?  metoprolol tartrate (LOPRESSOR) 50 MG tablet, TAKE 1 TABLET BY MOUTH 2  TIMES DAILY (EVERY 12  HOURS) FOR BLOOD PRESSURE ?  rosuvastatin (CRESTOR) 10 MG tablet, TAKE 1 TABLET BY MOUTH AT  BEDTIME ? ?Current Outpatient Medications (Respiratory):  ?  fexofenadine-pseudoephedrine (ALLEGRA-D 24) 180-240 MG 24 hr tablet, Take  1 tablet  Daily  for Sinus Allergies & Congestion ?  fluticasone (FLONASE) 50 MCG/ACT nasal spray, Place 2 sprays into both nostrils at bedtime. ?  levocetirizine (XYZAL) 5 MG tablet, TAKE 1 TABLET BY MOUTH  DAILY FOR ALLERGIES ? ?Current Outpatient Medications (Analgesics):  ?  acetaminophen (TYLENOL) 500 MG tablet, Take 1,000 mg by mouth every 6 (six) hours as needed. ?  naproxen (NAPROSYN) 500 MG tablet, Take 1 tablet (500 mg total) by mouth 2 (two) times daily. (Patient not taking: Reported on 09/09/2021) ? ?Current Outpatient Medications (Hematological):  ?  Cyanocobalamin (VITAMIN B 12 PO), Take by mouth daily. ? ?Current Outpatient Medications (Other):  ?  buPROPion (WELLBUTRIN XL) 300 MG 24 hr tablet, Take  1 tablet  every Morning  for Mood, Focus & Concentration ?  cholecalciferol (VITAMIN D) 1000 UNITS tablet, Take 2,000 Units by mouth daily. ?  dicyclomine (BENTYL) 20 MG tablet, Take  1/2 to 1 tablet  3 x /day  before Meals for Abdominal Discomfort / Cramping ?  famotidine (PEPCID) 40 MG tablet, Take 40 mg by  mouth daily. ?  gabapentin (NEURONTIN) 100 MG capsule, TAKE 1 CAPSULE BY MOUTH 3  TIMES DAILY ?  Magnesium 250 MG TABS, Take 250 mg by mouth daily. ?  TURMERIC PO, Take 500 mg by mouth. ? ?Medical History:  ?Past Medical History:  ?Diagnosis Date  ? Allergy   ? Endometriosis   ? GERD (gastroesophageal reflux disease)   ? Hyperlipidemia   ? Hypertension   ? Migraine   ? Prediabetes   ? Vitamin D deficiency   ? ?Allergies:  ?Allergies  ?Allergen Reactions  ? Penicillins   ?  rash  ?  ? ?Names of Other Physician/Practitioners you currently use: ?1. [facility] ?2. [place] exam: [place], discussed with patient ?3.Dental  exam: 07/2020 ? ? ?Patient Care Team: ?Unk Pinto, MD as PCP - General (Internal Medicine) ?Pyrtle, Lajuan Lines, MD as Consulting Physician (Gastroenterology) ?Louretta Shorten, MD as Consulting Physician (Obstetrics and Gynecology) ? ? ? ?Screening Tests: ?Immunization History  ?Administered Date(s) Administered  ? PFIZER(Purple Top)SARS-COV-2 Vaccination 10/11/2019, 11/07/2019  ? Tdap 07/17/2012  ? ? ?Preventative care: ?Last colonoscopy: N/A ?//Last mammogram: 8/20220 ?Last pap smear/pelvic exam: 02/2020 ?DEXA:N/A ?SARS-COV2-[employer] 06/2020 ? ? ?Vaccinations: ?TD or Tdap: 2014  ?Influenza: Due for 2022 ?Pneumococcal: N/A ?Prevnar13: N/A ?Shingles/Zostavax: N/A ? ? ? ?Review of Systems:  ?Review of Systems  ?Constitutional: Negative.  Negative for chills and fever.  ?HENT:  Positive for congestion. Negative for ear discharge, ear pain, hearing loss, nosebleeds, sinus pain, sore throat and tinnitus.   ?     + TMJ, wears night guard  ?Eyes:  Negative for blurred vision and double vision.  ?Respiratory:  Positive for wheezing (infrequent). Negative for cough, hemoptysis, sputum production, shortness of breath and stridor.   ?Cardiovascular: Negative.  Negative for chest pain, palpitations and leg swelling.  ?Gastrointestinal: Negative.   Negative for abdominal pain, constipation, diarrhea, heartburn, nausea and vomiting.  ?Genitourinary: Negative.  Negative for dysuria and urgency.  ?Musculoskeletal:  Positive for myalgias (left foot). Negative for

## 2021-10-13 ENCOUNTER — Encounter: Payer: Self-pay | Admitting: Nurse Practitioner

## 2021-10-13 ENCOUNTER — Ambulatory Visit (INDEPENDENT_AMBULATORY_CARE_PROVIDER_SITE_OTHER): Payer: BC Managed Care – PPO | Admitting: Nurse Practitioner

## 2021-10-13 ENCOUNTER — Encounter: Payer: BC Managed Care – PPO | Admitting: Adult Health Nurse Practitioner

## 2021-10-13 VITALS — BP 140/90 | HR 70 | Temp 97.5°F | Ht 64.5 in | Wt 148.8 lb

## 2021-10-13 DIAGNOSIS — Z1389 Encounter for screening for other disorder: Secondary | ICD-10-CM

## 2021-10-13 DIAGNOSIS — R61 Generalized hyperhidrosis: Secondary | ICD-10-CM | POA: Diagnosis not present

## 2021-10-13 DIAGNOSIS — G43809 Other migraine, not intractable, without status migrainosus: Secondary | ICD-10-CM | POA: Diagnosis not present

## 2021-10-13 DIAGNOSIS — Z136 Encounter for screening for cardiovascular disorders: Secondary | ICD-10-CM | POA: Diagnosis not present

## 2021-10-13 DIAGNOSIS — R7309 Other abnormal glucose: Secondary | ICD-10-CM

## 2021-10-13 DIAGNOSIS — F17200 Nicotine dependence, unspecified, uncomplicated: Secondary | ICD-10-CM

## 2021-10-13 DIAGNOSIS — Z0000 Encounter for general adult medical examination without abnormal findings: Secondary | ICD-10-CM | POA: Diagnosis not present

## 2021-10-13 DIAGNOSIS — E559 Vitamin D deficiency, unspecified: Secondary | ICD-10-CM | POA: Diagnosis not present

## 2021-10-13 DIAGNOSIS — I700 Atherosclerosis of aorta: Secondary | ICD-10-CM

## 2021-10-13 DIAGNOSIS — G8929 Other chronic pain: Secondary | ICD-10-CM | POA: Diagnosis not present

## 2021-10-13 DIAGNOSIS — Z1211 Encounter for screening for malignant neoplasm of colon: Secondary | ICD-10-CM

## 2021-10-13 DIAGNOSIS — Z13220 Encounter for screening for lipoid disorders: Secondary | ICD-10-CM | POA: Diagnosis not present

## 2021-10-13 DIAGNOSIS — Z9109 Other allergy status, other than to drugs and biological substances: Secondary | ICD-10-CM

## 2021-10-13 DIAGNOSIS — I10 Essential (primary) hypertension: Secondary | ICD-10-CM | POA: Diagnosis not present

## 2021-10-13 DIAGNOSIS — Z8249 Family history of ischemic heart disease and other diseases of the circulatory system: Secondary | ICD-10-CM | POA: Diagnosis not present

## 2021-10-13 DIAGNOSIS — E785 Hyperlipidemia, unspecified: Secondary | ICD-10-CM

## 2021-10-13 DIAGNOSIS — R232 Flushing: Secondary | ICD-10-CM

## 2021-10-13 DIAGNOSIS — Z131 Encounter for screening for diabetes mellitus: Secondary | ICD-10-CM | POA: Diagnosis not present

## 2021-10-13 DIAGNOSIS — Z79899 Other long term (current) drug therapy: Secondary | ICD-10-CM

## 2021-10-13 DIAGNOSIS — K824 Cholesterolosis of gallbladder: Secondary | ICD-10-CM

## 2021-10-13 DIAGNOSIS — E7841 Elevated Lipoprotein(a): Secondary | ICD-10-CM

## 2021-10-13 DIAGNOSIS — Z0001 Encounter for general adult medical examination with abnormal findings: Secondary | ICD-10-CM

## 2021-10-13 MED ORDER — DEXAMETHASONE SODIUM PHOSPHATE 10 MG/ML IJ SOLN
10.0000 mg | Freq: Once | INTRAMUSCULAR | Status: AC
  Administered 2021-10-13: 10 mg via INTRAMUSCULAR

## 2021-10-13 MED ORDER — MONTELUKAST SODIUM 10 MG PO TABS: 10.0000 mg | ORAL_TABLET | Freq: Every day | ORAL | 2 refills | Status: DC

## 2021-10-13 NOTE — Patient Instructions (Signed)
Montelukast at night and Allegra D in the morning ?Montelukast Tablets ?What is this medication? ?MONTELUKAST (mon te LOO kast) prevents and treats the symptoms of asthma and allergies. It works by decreasing inflammation in the airways, making it easier to breathe. Do not use this medication to treat a sudden asthma attack. ?This medicine may be used for other purposes; ask your health care provider or pharmacist if you have questions. ?COMMON BRAND NAME(S): Singulair ?What should I tell my care team before I take this medication? ?They need to know if you have any of these conditions: ?Liver disease ?An unusual or allergic reaction to montelukast, other medications, foods, dyes, or preservatives ?Pregnant or trying to get pregnant ?Breast-feeding ?How should I use this medication? ?Take this medication by mouth with water. Take it as directed on the prescription label at the same time every day. You can take this medication with or without food. If it upsets your stomach, take it with food. Keep taking it unless your care team tells you to stop. ?A special MedGuide will be given to you by the pharmacist with each prescription and refill. Be sure to read this information carefully each time. ?Talk to your care team about the use of this medication in children. While this medication may be prescribed for children as young as 15 years for selected conditions, precautions do apply. ?Overdosage: If you think you have taken too much of this medicine contact a poison control center or emergency room at once. ?NOTE: This medicine is only for you. Do not share this medicine with others. ?What if I miss a dose? ?If you miss a dose, skip it. Take your next dose at the normal time. Do not take extra or 2 doses at the same time to make up for the missed dose. ?What may interact with this medication? ?Medications for seizures like phenytoin, phenobarbital, and carbamazepine ?Rifabutin ?Rifampin ?This list may not describe all  possible interactions. Give your health care provider a list of all the medicines, herbs, non-prescription drugs, or dietary supplements you use. Also tell them if you smoke, drink alcohol, or use illegal drugs. Some items may interact with your medicine. ?What should I watch for while using this medication? ?Visit your health care provider for regular checks on your progress. Tell your health care provider if your allergy or asthma symptoms do not improve. Take your medication even when you do not have symptoms. ?If you have asthma, talk to your health care provider about what to do in an acute asthma attack. Always have your rescue medication for asthma attacks with you. ?Patients and their families should watch for new or worsening thoughts of suicide or depression. Also watch for sudden changes in feelings such as feeling anxious, agitated, panicky, irritable, hostile, aggressive, impulsive, severely restless, overly excited and hyperactive, or not being able to sleep. Any worsening of mood or thoughts of suicide or dying should be reported to your health care provider right away. ?What side effects may I notice from receiving this medication? ?Side effects that you should report to your care team as soon as possible: ?Allergic reactions--skin rash, itching, hives, swelling of the face, lips, tongue, or throat ?Flu-like symptoms--fever, chills, muscle pain, cough, headache, fatigue ?Mood and behavior changes such as anxiety, nervousness, confusion, hallucinations, irritability, hostility, thoughts of suicide or self-harm, worsening mood, feelings of depression ?Pain, tingling, or numbness in the hands or feet ?Sinus pain or pressure around the face or forehead ?Trouble sleeping ?Vivid dreams or nightmares ?  Side effects that usually do not require medical attention (report to your care team if they continue or are bothersome): ?Cough ?Diarrhea ?Headache ?Runny or stuffy nose ?Sore throat ?Stomach pain ?This list  may not describe all possible side effects. Call your doctor for medical advice about side effects. You may report side effects to FDA at 1-800-FDA-1088. ?Where should I keep my medication? ?Keep out of the reach of children and pets. ?Store at room temperature between 15 and 30 degrees C (59 and 86 degrees F). Protect from light and moisture. Protect from light and moisture. Keep the container tightly closed. Get rid of any unused medication after the expiration date. ?To get rid of medications that are no longer needed or expired: ?Take the medication to a medication take-back program. Check with your pharmacy or law enforcement to find a location. ?If you cannot return the medication, check the label or package insert to see if the medication should be thrown out in the garbage or flushed down the toilet. If you are not sure, ask your care team. If it is safe to put in the trash, empty the medication out of the container. Mix the medication with cat litter, dirt, coffee grounds, or other unwanted substance. Seal the mixture in a bag or container. Put it in the trash. ?NOTE: This sheet is a summary. It may not cover all possible information. If you have questions about this medicine, talk to your doctor, pharmacist, or health care provider. ?? 2022 Elsevier/Gold Standard (2020-07-25 00:00:00) ? ?

## 2021-10-14 LAB — HEMOGLOBIN A1C
Hgb A1c MFr Bld: 5.4 % of total Hgb (ref <5.7)
Mean Plasma Glucose: 108 mg/dL
eAG (mmol/L): 6 mmol/L

## 2021-10-14 LAB — VITAMIN D 25 HYDROXY (VIT D DEFICIENCY, FRACTURES): Vit D, 25-Hydroxy: 57 ng/mL (ref 30–100)

## 2021-10-14 LAB — FOLLICLE STIMULATING HORMONE: FSH: 6.7 m[IU]/mL

## 2021-10-14 LAB — CBC WITH DIFFERENTIAL/PLATELET
Absolute Monocytes: 686 cells/uL (ref 200–950)
Basophils Absolute: 53 cells/uL (ref 0–200)
Basophils Relative: 0.6 %
Eosinophils Absolute: 106 cells/uL (ref 15–500)
Eosinophils Relative: 1.2 %
HCT: 40.7 % (ref 35.0–45.0)
Hemoglobin: 13.7 g/dL (ref 11.7–15.5)
Lymphs Abs: 2622 cells/uL (ref 850–3900)
MCH: 33.2 pg — ABNORMAL HIGH (ref 27.0–33.0)
MCHC: 33.7 g/dL (ref 32.0–36.0)
MCV: 98.5 fL (ref 80.0–100.0)
MPV: 13.1 fL — ABNORMAL HIGH (ref 7.5–12.5)
Monocytes Relative: 7.8 %
Neutro Abs: 5333 cells/uL (ref 1500–7800)
Neutrophils Relative %: 60.6 %
Platelets: 203 10*3/uL (ref 140–400)
RBC: 4.13 10*6/uL (ref 3.80–5.10)
RDW: 12.8 % (ref 11.0–15.0)
Total Lymphocyte: 29.8 %
WBC: 8.8 10*3/uL (ref 3.8–10.8)

## 2021-10-14 LAB — LIPID PANEL
Cholesterol: 156 mg/dL (ref <200)
HDL: 55 mg/dL (ref >=50)
LDL Cholesterol (Calc): 86 mg/dL (calc)
Non-HDL Cholesterol (Calc): 101 mg/dL (calc) (ref <130)
Total CHOL/HDL Ratio: 2.8 (calc) (ref <5.0)
Triglycerides: 67 mg/dL (ref <150)

## 2021-10-14 LAB — COMPLETE METABOLIC PANEL WITH GFR
AG Ratio: 1.8 (calc) (ref 1.0–2.5)
ALT: 19 U/L (ref 6–29)
AST: 18 U/L (ref 10–35)
Albumin: 4.2 g/dL (ref 3.6–5.1)
Alkaline phosphatase (APISO): 61 U/L (ref 31–125)
BUN: 9 mg/dL (ref 7–25)
CO2: 31 mmol/L (ref 20–32)
Calcium: 9.6 mg/dL (ref 8.6–10.2)
Chloride: 104 mmol/L (ref 98–110)
Creat: 0.66 mg/dL (ref 0.50–0.99)
Globulin: 2.4 g/dL (calc) (ref 1.9–3.7)
Glucose, Bld: 81 mg/dL (ref 65–99)
Potassium: 4.4 mmol/L (ref 3.5–5.3)
Sodium: 140 mmol/L (ref 135–146)
Total Bilirubin: 0.4 mg/dL (ref 0.2–1.2)
Total Protein: 6.6 g/dL (ref 6.1–8.1)
eGFR: 109 mL/min/{1.73_m2} (ref >=60)

## 2021-10-14 LAB — URINALYSIS, ROUTINE W REFLEX MICROSCOPIC
Bilirubin Urine: NEGATIVE
Glucose, UA: NEGATIVE
Hgb urine dipstick: NEGATIVE
Ketones, ur: NEGATIVE
Leukocytes,Ua: NEGATIVE
Nitrite: NEGATIVE
Protein, ur: NEGATIVE
Specific Gravity, Urine: 1.006 (ref 1.001–1.035)
pH: 7.5 (ref 5.0–8.0)

## 2021-10-14 LAB — MICROALBUMIN / CREATININE URINE RATIO
Creatinine, Urine: 22 mg/dL (ref 20–275)
Microalb, Ur: <0.2 mg/dL

## 2021-10-14 LAB — LUTEINIZING HORMONE: LH: 2.6 m[IU]/mL

## 2021-10-14 LAB — TSH: TSH: 1.16 mIU/L

## 2021-10-14 LAB — MAGNESIUM: Magnesium: 2 mg/dL (ref 1.5–2.5)

## 2021-10-23 IMAGING — CR DG LUMBAR SPINE COMPLETE 4+V
5 series · 5 of 5 positions shown · non-contrast
Comparison: May 08, 2012.

CLINICAL DATA: Low back and bilateral lower extremity pain for 3
months.

EXAM:
LUMBAR SPINE - COMPLETE 4+ VIEW

[t lumbar spine ap]
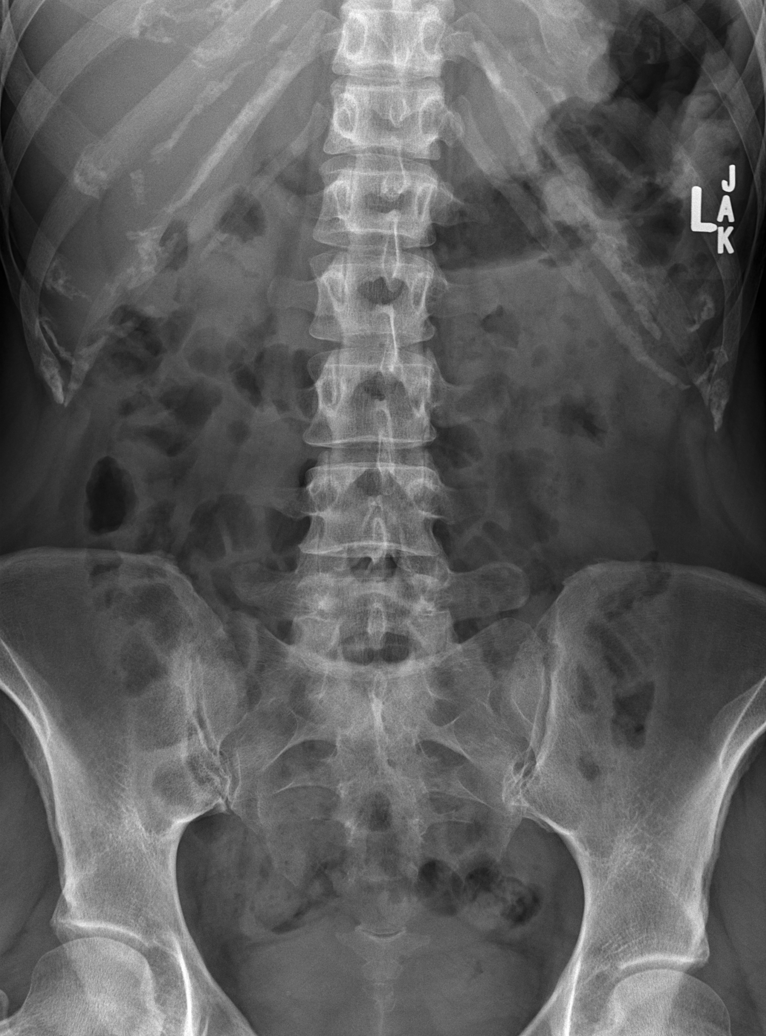

[t lumbar spine obl (1 of 2)]
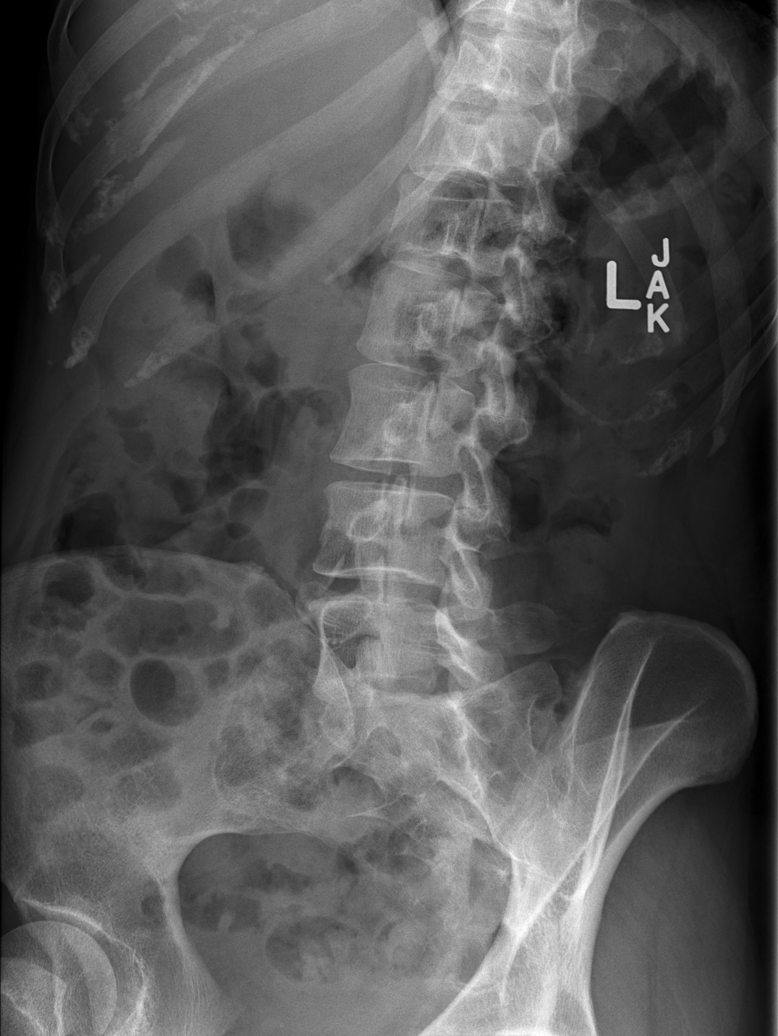

[t lumbar spine obl (2 of 2)]
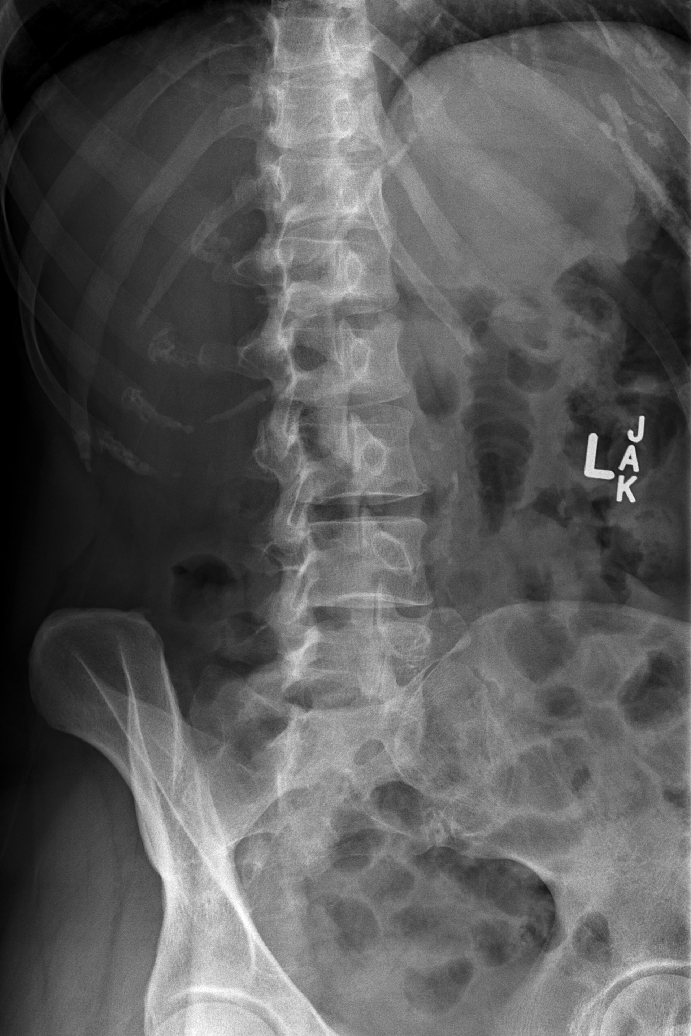

[t lumbar spine lat]
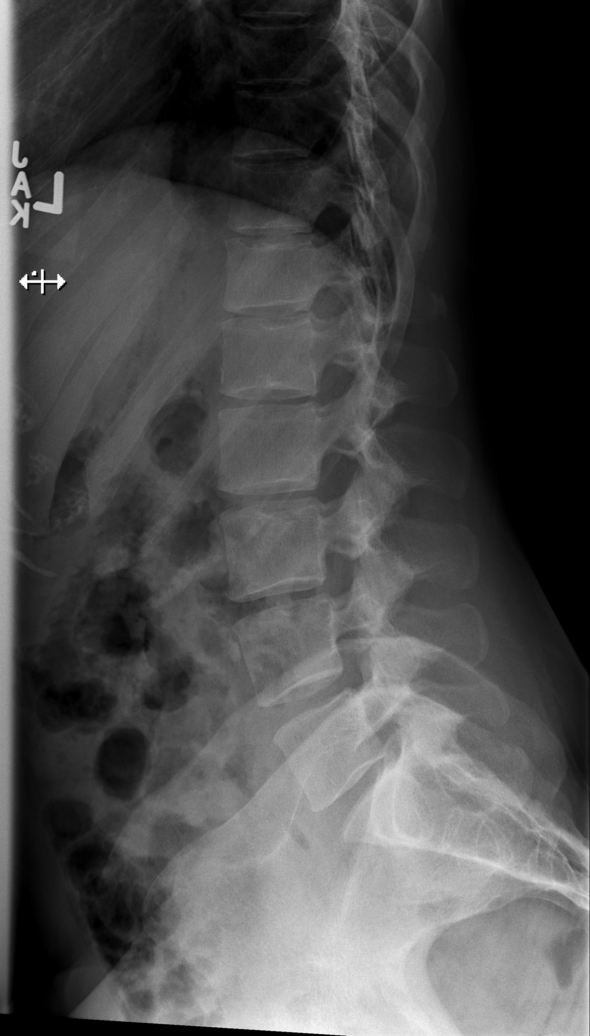

[t lumbar l-5 s-1 spot]
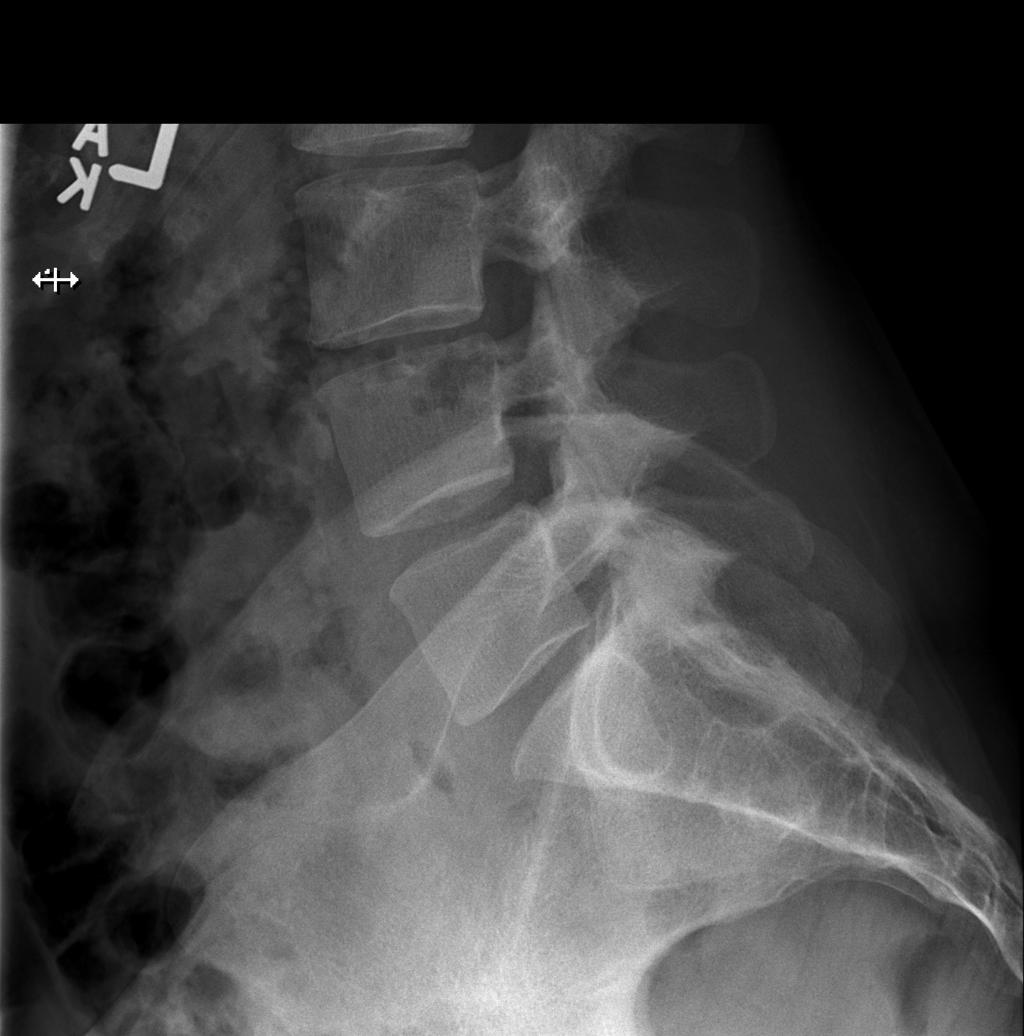

[5 of 5 positions shown; findings below may reference images not displayed]

FINDINGS: There is no evidence of lumbar spine fracture. Alignment is normal.
Intervertebral disc spaces are maintained.
IMPRESSION: Negative.

## 2021-11-02 DIAGNOSIS — Z1211 Encounter for screening for malignant neoplasm of colon: Secondary | ICD-10-CM | POA: Diagnosis not present

## 2021-11-08 LAB — COLOGUARD: COLOGUARD: NEGATIVE

## 2022-01-22 ENCOUNTER — Other Ambulatory Visit: Payer: Self-pay | Admitting: Internal Medicine

## 2022-01-22 DIAGNOSIS — F4321 Adjustment disorder with depressed mood: Secondary | ICD-10-CM

## 2022-04-12 ENCOUNTER — Other Ambulatory Visit: Payer: Self-pay

## 2022-04-12 DIAGNOSIS — I10 Essential (primary) hypertension: Secondary | ICD-10-CM

## 2022-04-12 DIAGNOSIS — Z0001 Encounter for general adult medical examination with abnormal findings: Secondary | ICD-10-CM

## 2022-04-12 MED ORDER — METOPROLOL TARTRATE 50 MG PO TABS: ORAL_TABLET | ORAL | 3 refills | Status: DC

## 2022-04-12 MED ORDER — HYDROCHLOROTHIAZIDE 25 MG PO TABS: ORAL_TABLET | ORAL | 3 refills | Status: DC

## 2022-04-15 DIAGNOSIS — Z1231 Encounter for screening mammogram for malignant neoplasm of breast: Secondary | ICD-10-CM | POA: Diagnosis not present

## 2022-04-15 DIAGNOSIS — Z01419 Encounter for gynecological examination (general) (routine) without abnormal findings: Secondary | ICD-10-CM | POA: Diagnosis not present

## 2022-04-15 DIAGNOSIS — Z1272 Encounter for screening for malignant neoplasm of vagina: Secondary | ICD-10-CM | POA: Diagnosis not present

## 2022-04-15 DIAGNOSIS — Z6826 Body mass index (BMI) 26.0-26.9, adult: Secondary | ICD-10-CM | POA: Diagnosis not present

## 2022-04-22 ENCOUNTER — Encounter: Payer: Self-pay | Admitting: Nurse Practitioner

## 2022-04-22 ENCOUNTER — Ambulatory Visit: Payer: BC Managed Care – PPO | Admitting: Nurse Practitioner

## 2022-04-22 VITALS — BP 160/90 | HR 58 | Temp 97.9°F | Resp 98 | Ht 64.5 in | Wt 153.0 lb

## 2022-04-22 DIAGNOSIS — M549 Dorsalgia, unspecified: Secondary | ICD-10-CM

## 2022-04-22 DIAGNOSIS — F17200 Nicotine dependence, unspecified, uncomplicated: Secondary | ICD-10-CM | POA: Diagnosis not present

## 2022-04-22 DIAGNOSIS — Z8249 Family history of ischemic heart disease and other diseases of the circulatory system: Secondary | ICD-10-CM

## 2022-04-22 DIAGNOSIS — I10 Essential (primary) hypertension: Secondary | ICD-10-CM

## 2022-04-22 DIAGNOSIS — R0789 Other chest pain: Secondary | ICD-10-CM | POA: Diagnosis not present

## 2022-04-22 DIAGNOSIS — E785 Hyperlipidemia, unspecified: Secondary | ICD-10-CM

## 2022-04-22 DIAGNOSIS — Z79890 Hormone replacement therapy (postmenopausal): Secondary | ICD-10-CM

## 2022-04-22 DIAGNOSIS — Z79899 Other long term (current) drug therapy: Secondary | ICD-10-CM

## 2022-04-22 MED ORDER — CYCLOBENZAPRINE HCL 10 MG PO TABS: ORAL_TABLET | ORAL | 0 refills | Status: DC

## 2022-04-22 NOTE — Patient Instructions (Signed)
Blood Pressure Record Sheet To take your blood pressure, you will need a blood pressure machine. You may be prescribed one, or you can buy a blood pressure machine (blood pressure monitor) at your clinic, drug store, or online. When choosing one, look for these features: An automatic monitor that has an arm cuff. A cuff that wraps snugly, but not too tightly, around your upper arm. You should be able to fit only one finger between your arm and the cuff. A device that stores blood pressure reading results. Do not choose a monitor that measures your blood pressure from your wrist or finger. Follow your health care provider's instructions for how to take your blood pressure. To use this form: Get one reading in the morning (a.m.) before you take any medicines. Get one reading in the evening (p.m.) before supper. Take at least two readings with each blood pressure check. This makes sure the results are correct. Wait 1-2 minutes between measurements. Write down the results in the spaces on this form. Repeat this once a week, or as told by your health care provider. Make a follow-up appointment with your health care provider to discuss the results. Blood pressure log Date: _______________________ a.m. _____________________(1st reading) _____________________(2nd reading) p.m. _____________________(1st reading) _____________________(2nd reading) Date: _______________________ a.m. _____________________(1st reading) _____________________(2nd reading) p.m. _____________________(1st reading) _____________________(2nd reading) Date: _______________________ a.m. _____________________(1st reading) _____________________(2nd reading) p.m. _____________________(1st reading) _____________________(2nd reading) Date: _______________________ a.m. _____________________(1st reading) _____________________(2nd reading) p.m. _____________________(1st reading) _____________________(2nd reading) Date:  _______________________ a.m. _____________________(1st reading) _____________________(2nd reading) p.m. _____________________(1st reading) _____________________(2nd reading) This information is not intended to replace advice given to you by your health care provider. Make sure you discuss any questions you have with your health care provider. Document Revised: 03/12/2021 Document Reviewed: 03/12/2021 Elsevier Patient Education  2023 Elsevier Inc.  

## 2022-04-22 NOTE — Progress Notes (Signed)
FOLLOW UP  Assessment and Plan:   Andrea Owens was seen today for  follow up  Diagnoses and all orders for this visit:  Essential hypertension Elevated  Family history MI at 33 Continue current medications: Metoprolol '50mg'$  BID and HCTZ '25mg'$  daily in am. Discussed added Losartan if BP does not respond to lifestyle changes of smoking cessation, staying well hydrated, avoiding salt.  Discussed DASH (Dietary Approaches to Stop Hypertension) DASH diet is lower in sodium than a typical American diet. Cut back on foods that are high in saturated fat, cholesterol, and trans fats. Eat more whole-grain foods, fish, poultry, and nuts Remain active and exercise as tolerated daily.  Monitor BP at home-Call if greater than 130/80.  Check CMP/CBC Discussed possible referral to Cardiology if s/s fail to improve.   Hyperlipidemia, unspecified hyperlipidemia type Elevated lipoprotein(a) Family history of early CAD Discussed lifestyle modifications. Recommended diet heavy in fruits and veggies, omega 3's. Decrease consumption of animal meats, cheeses, and dairy products. Remain active and exercise as tolerated. Continue to monitor. Check lipids/TSH  Tobacco use disorder Smoking cessation instruction/counseling given:  counseled patient on the dangers of tobacco use, advised patient to stop smoking, and reviewed strategies to maximize success Half a pack a day, Continue work program "quit for life" Discussed smoking cessation Not ready to quit at this time  Medication management All medications discussed and reviewed in full. All questions and concerns regarding medications addressed.    Other chest pain EKG - NSR Report to ER or call 911 for any increase in stroke like symptoms, including HA, N/V, paralysis, difficulty speaking, trouble walking, confusion, vision changes, CP, heart palpitations, SOB, diaphoresis.  Back pain upper right side Start Cyclobenzaprine as directed. Rest.  Alternate  Ice/Heat Continue to monitor  Hormone replacement therapy Possible contributor to elevated BP Due to be removed in 3 weeks with GYN Continue to monitor  Orders Placed This Encounter  Procedures   CBC with Differential/Platelet   COMPLETE METABOLIC PANEL WITH GFR   Lipid panel   EKG 12-Lead   Meds ordered this encounter  Medications   cyclobenzaprine (FLEXERIL) 10 MG tablet    Sig: Take 1/2 to 1 tablet 3 x /day as needed for Muscle Spasm    Dispense:  30 tablet    Refill:  0    Order Specific Question:   Supervising Provider    Answer:   Unk Pinto [phone]    Notify office for further evaluation and treatment, questions or concerns if any reported s/s fail to improve.   The patient was advised to call back or seek an in-person evaluation if any symptoms worsen or if the condition fails to improve as anticipated.   Further disposition pending results of labs. Discussed med's effects and SE's.    I discussed the assessment and treatment plan with the patient. The patient was provided an opportunity to ask questions and all were answered. The patient agreed with the plan and demonstrated an understanding of the instructions.  Discussed med's effects and SE's. Screening labs and tests as requested with regular follow-up as recommended.  I provided 20 minutes of face-to-face time during this encounter including counseling, chart review, and critical decision making was preformed.   Future Appointments  Date Time Provider [place]  10/14/2022  9:00 AM Alycia Rossetti, NP [facility] None     HPI 46 y.o. female  presents for 6 month follow up on HTN, HLD, prediabetes/abnormal glucose, and vitamin D deficiency.   She recently  followed with GYN and was placed on Estradiol patch.  She will have this removed in 3 weeks.  Her father has started to recover from his brain cancer, has had surgery to remove the tumor and is feeling well.     Has noticed increase in  right upper back pain.  Tightness, unable to fully turn head to right side without pain, some difficulty sleeping.  Reports stressful job and higher elevation in BP during her home readings.     Her blood pressure is usually controlled at home, but she has noticed systolic elevation of 161-096, associated intermittent chest pressure. she is on HCTZ '25mg'$  daily and metoprolol '50mg'$  BID, compliant with medications today their BP is BP: (!) 160/90   BP Readings from Last 3 Encounters:  04/22/22 (!) 160/90  10/13/21 140/90  09/23/21 120/76  She is trying to stay well hydrated but admits to "not drinking much at all."  She is a current every day smoker.  Trying to quit.  She has hx of elevated cholesterol and is currently on a statin.  Reports medication compliance.  Also a family hx of early CAD and father with MI at age 50.   BMI is Body mass index is 25.86 kg/m., she is working on diet and exercise.  She has gained 5 lb since LOV. Wt Readings from Last 3 Encounters:  04/22/22 153 lb (69.4 kg)  10/13/21 148 lb 12.8 oz (67.5 kg)  09/23/21 151 lb (68.5 kg)   She continues to smoke, about a 1/2 a pack a day. She has tried chantix and did not like the feeling.  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 She is on the '150mg'$  daily.    She is on cholesterol medication and denies myalgias, on Rosuvastatin '10mg'$  nightly. Her cholesterol is at goal. The cholesterol last visit was:   Lab Results  Component Value Date   CHOL 156 10/13/2021   HDL 55 10/13/2021   LDLCALC 86 10/13/2021   TRIG 67 10/13/2021   CHOLHDL 2.8 10/13/2021    She has been working on diet and exercise for prediabetes, and denies   Current Medications:     Current Outpatient Medications (Cardiovascular):    hydrochlorothiazide (HYDRODIURIL) 25 MG tablet, TAKE 1 TABLET BY MOUTH  DAILY FOR BLOOD PRESSURE  AND FLUID RETENTION, ANKLE  SWELLING   metoprolol tartrate (LOPRESSOR) 50 MG  tablet, TAKE 1 TABLET BY MOUTH 2  TIMES DAILY (EVERY 12  HOURS) FOR BLOOD PRESSURE   rosuvastatin (CRESTOR) 10 MG tablet, TAKE 1 TABLET BY MOUTH AT  BEDTIME  Current Outpatient Medications (Respiratory):    fexofenadine-pseudoephedrine (ALLEGRA-D 24) 180-240 MG 24 hr tablet, Take  1 tablet  Daily  for Sinus Allergies & Congestion   fluticasone (FLONASE) 50 MCG/ACT nasal spray, Place 2 sprays into both nostrils at bedtime.   montelukast (SINGULAIR) 10 MG tablet, Take 1 tablet (10 mg total) by mouth daily.   levocetirizine (XYZAL) 5 MG tablet, TAKE 1 TABLET BY MOUTH  DAILY FOR ALLERGIES (Patient not taking: Reported on 04/22/2022)  Current Outpatient Medications (Analgesics):    acetaminophen (TYLENOL) 500 MG tablet, Take 1,000 mg by mouth every 6 (six) hours as needed.   naproxen (NAPROSYN) 500 MG tablet, Take 1 tablet (500 mg total) by mouth 2 (two) times daily. (Patient taking differently: Take 500 mg by mouth as needed.)  Current Outpatient Medications (Hematological):    Cyanocobalamin (VITAMIN B 12 PO), Take  by mouth daily.  Current Outpatient Medications (Other):    buPROPion (WELLBUTRIN XL) 300 MG 24 hr tablet, TAKE 1 TABLET BY MOUTH  EVERY MORNING FOR MOOD,  FOCUS AND CONCENTRATION   cholecalciferol (VITAMIN D) 1000 UNITS tablet, Take 2,000 Units by mouth daily.   dicyclomine (BENTYL) 20 MG tablet, Take  1/2 to 1 tablet  3 x /day  before Meals for Abdominal Discomfort / Cramping (Patient taking differently: as needed. Take  1/2 to 1 tablet  3 x /day  before Meals for Abdominal Discomfort / Cramping)   famotidine (PEPCID) 40 MG tablet, Take 40 mg by mouth daily.   gabapentin (NEURONTIN) 100 MG capsule, TAKE 1 CAPSULE BY MOUTH 3  TIMES DAILY (Patient taking differently: as needed.)   Magnesium 250 MG TABS, Take 250 mg by mouth daily.   TURMERIC PO, Take 500 mg by mouth.  Medical History:  Past Medical History:  Diagnosis Date   Allergy    Endometriosis    GERD (gastroesophageal  reflux disease)    Hyperlipidemia    Hypertension    Migraine    Prediabetes    Vitamin D deficiency    Allergies:  Allergies  Allergen Reactions   Penicillins     rash     Names of Other Physician/Practitioners you currently use: 1. [facility] here for primary care 2. Eye exam: Due, [place]- bilateral 2005, discussed with patient 3.Dental exam: 07/2020   Patient Care Team: Unk Pinto, MD as PCP - General (Internal Medicine) Pyrtle, Lajuan Lines, MD as Consulting Physician (Gastroenterology) Louretta Shorten, MD as Consulting Physician (Obstetrics and Gynecology)    Screening Tests: Immunization History  Administered Date(s) Administered   PFIZER(Purple Top)SARS-COV-2 Vaccination 10/11/2019, 11/07/2019   Tdap 07/17/2012     Review of Systems:  Review of Systems  Constitutional: Negative.  Negative for chills and fever.  HENT:  Negative for congestion, ear discharge, ear pain, hearing loss, nosebleeds, sinus pain, sore throat and tinnitus.        + TMJ, wears night guard  Eyes:  Negative for blurred vision and double vision.  Respiratory:  Negative for cough, hemoptysis, sputum production, shortness of breath, wheezing (infrequent) and stridor.   Cardiovascular:  Positive for chest pain. Negative for palpitations and leg swelling.  Gastrointestinal: Negative.  Negative for abdominal pain, constipation, diarrhea, heartburn, nausea and vomiting.  Genitourinary: Negative.  Negative for dysuria and urgency.  Musculoskeletal:  Positive for back pain. Negative for falls, joint pain, myalgias and neck pain.  Skin: Negative.  Negative for rash.  Neurological:  Negative for dizziness, tingling, tremors, sensory change, seizures, weakness and headaches.  Endo/Heme/Allergies:  Does not bruise/bleed easily.  Psychiatric/Behavioral: Negative.  Negative for depression and suicidal ideas. The patient is not nervous/anxious and does not have insomnia.      Family history- Review and unchanged Social history- Review and unchanged  Physical Exam: BP (!) 160/90   Pulse (!) 58   Temp 97.9 F (36.6 C)   Resp (!) 98   Ht 5' 4.5" (1.638 m)   Wt 153 lb (69.4 kg)   LMP 03/15/2015   BMI 25.86 kg/m  Wt Readings from Last 3 Encounters:  04/22/22 153 lb (69.4 kg)  10/13/21 148 lb 12.8 oz (67.5 kg)  09/23/21 151 lb (68.5 kg)   General Appearance: Well nourished, in no apparent distress. Eyes: PERRLA, EOMs, conjunctiva no swelling or erythema Sinuses: No Frontal/maxillary tenderness ENT/Mouth: Ext aud canals clear, TMs without erythema, bulging. No  erythema, swelling, or exudate on post pharynx.  Tonsils not swollen or erythematous. Hearing normal.  Neck: Supple, thyroid with small nodules on the left.  Respiratory: Respiratory effort normal, BS equal bilaterally without rales, rhonchi Expiratory wheezing top lobes bilaterally that clears with cough. Cardio: RRR with no MRGs. Brisk peripheral pulses without edema.  Abdomen: Soft, + BS, nontender, no guarding, rebound, hernias, masses. Lymphatics: Non tender without lymphadenopathy.  Musculoskeletal:  No warmth, erythema. 5/5 strength Skin: dry without rashes, lesions, ecchymosis.  Bilateral feet, sensation intact.   Neuro: Cranial nerves intact. Normal muscle tone, no cerebellar symptoms. Psych: Awake and oriented X 3, normal affect, Insight and Judgment appropriate.   EKG: NSR, no ST changes   Darrol Jump, NP [facility]  04/22/2022

## 2022-04-23 LAB — CBC WITH DIFFERENTIAL/PLATELET
Absolute Monocytes: 792 cells/uL (ref 200–950)
Basophils Absolute: 52 cells/uL (ref 0–200)
Basophils Relative: 0.6 %
Eosinophils Absolute: 122 cells/uL (ref 15–500)
Eosinophils Relative: 1.4 %
HCT: 42.2 % (ref 35.0–45.0)
Hemoglobin: 14.3 g/dL (ref 11.7–15.5)
Lymphs Abs: 2828 cells/uL (ref 850–3900)
MCH: 33.9 pg — ABNORMAL HIGH (ref 27.0–33.0)
MCHC: 33.9 g/dL (ref 32.0–36.0)
MCV: 100 fL (ref 80.0–100.0)
MPV: 13.9 fL — ABNORMAL HIGH (ref 7.5–12.5)
Monocytes Relative: 9.1 %
Neutro Abs: 4907 cells/uL (ref 1500–7800)
Neutrophils Relative %: 56.4 %
Platelets: 173 10*3/uL (ref 140–400)
RBC: 4.22 10*6/uL (ref 3.80–5.10)
RDW: 12.3 % (ref 11.0–15.0)
Total Lymphocyte: 32.5 %
WBC: 8.7 10*3/uL (ref 3.8–10.8)

## 2022-04-23 LAB — LIPID PANEL
Cholesterol: 160 mg/dL (ref <200)
HDL: 51 mg/dL (ref >=50)
LDL Cholesterol (Calc): 82 mg/dL (calc)
Non-HDL Cholesterol (Calc): 109 mg/dL (calc) (ref <130)
Total CHOL/HDL Ratio: 3.1 (calc) (ref <5.0)
Triglycerides: 175 mg/dL — ABNORMAL HIGH (ref <150)

## 2022-04-23 LAB — COMPLETE METABOLIC PANEL WITH GFR
AG Ratio: 2.1 (calc) (ref 1.0–2.5)
ALT: 22 U/L (ref 6–29)
AST: 17 U/L (ref 10–35)
Albumin: 4.4 g/dL (ref 3.6–5.1)
Alkaline phosphatase (APISO): 66 U/L (ref 31–125)
BUN: 13 mg/dL (ref 7–25)
CO2: 30 mmol/L (ref 20–32)
Calcium: 9.6 mg/dL (ref 8.6–10.2)
Chloride: 105 mmol/L (ref 98–110)
Creat: 0.7 mg/dL (ref 0.50–0.99)
Globulin: 2.1 g/dL (calc) (ref 1.9–3.7)
Glucose, Bld: 76 mg/dL (ref 65–99)
Potassium: 4.2 mmol/L (ref 3.5–5.3)
Sodium: 141 mmol/L (ref 135–146)
Total Bilirubin: 0.3 mg/dL (ref 0.2–1.2)
Total Protein: 6.5 g/dL (ref 6.1–8.1)
eGFR: 107 mL/min/{1.73_m2} (ref >=60)

## 2022-08-25 ENCOUNTER — Other Ambulatory Visit: Payer: Self-pay

## 2022-08-25 MED ORDER — ROSUVASTATIN CALCIUM 10 MG PO TABS: 10.0000 mg | ORAL_TABLET | Freq: Every day | ORAL | 3 refills | Status: DC

## 2022-09-13 ENCOUNTER — Other Ambulatory Visit: Payer: Self-pay | Admitting: Nurse Practitioner

## 2022-09-13 DIAGNOSIS — Z9109 Other allergy status, other than to drugs and biological substances: Secondary | ICD-10-CM

## 2022-10-13 NOTE — Progress Notes (Unsigned)
COMPLETE PHYSICAL   Assessment and Plan:   Andrea Owens was seen today for annual exam.  Diagnoses and all orders for this visit:  Encounter for routine adult medical exam with abnormal findings Yearly  Essential hypertension Continue current medications: Metoprolol 50mg  BID and HCTZ 25mg  daily in am. Monitor blood pressure at home; call if consistently over 130/80 Continue DASH diet.   Reminder to go to the ER if any CP, SOB, nausea, dizziness, severe HA, changes vision/speech, left arm numbness and tingling and jaw pain. -     CBC with Differential/Platelet -     COMPLETE METABOLIC PANEL WITH GFR -     TSH -     Magnesium -     EKG 12-Lead  Aortic Atherosclerosis by CT 09/2021(HCC) Control BP, Blood sugars, weight and cholesterol  Hyperlipidemia, unspecified hyperlipidemia type Elevated lipoprotein(a) Family history of early CAD Family history MI at 67 Cardiac calcium score at age 10? Discussed with patient -     Lipid panel  Tobacco use disorder Half a pack a day, she is doing a program through work-"quit for life" Discussed smoking cessation Not ready to quit at this time  Abnormal glucose Discussed dietary and exercise modifications -     Hemoglobin A1c  Vitamin D deficiency Continue supplementation to maintain goal of 70-100 Taking Vitamin D 2,000 IU daily -     VITAMIN D 25 Hydroxy (Vit-D Deficiency, Fractures)  Gallbladder polyp Monitor  Chronic midline low back pain with right-sided sciatica Continue follow up with Dr Derry Lory  Medication management Continued  Other migraine without status migrainosus, not intractable Doing well at this time  Environmental allergies Continue Allegra D and Flonase Add Singulair Dexamethasone 10 mg IM given in office today.   Screening, ischemic heart disease -EKG  Colon cancer screening - Cologuard  Hot flashes/night sweats - FSH - LH  Screening for blood or protein in urine -     Urinalysis routine with  reflex microscopic -  Microalbumin/creatinine urine ratio     Continue diet and meds as discussed. Further disposition pending results of labs. Over 30 minutes of face to face interview, exam, counseling, chart review, and critical decision making was performed  Future Appointments  Date Time Provider [place]  10/14/2022  9:00 AM Alycia Rossetti, NP [facility] None  10/14/2023  9:00 AM Alycia Rossetti, NP [facility] None     HPI 47 y.o. female  presents for 6 month follow up on HTN, HLD, prediabetes/abnormal glucose, and vitamin D deficiency.   Overall she is doing well.    Andrea Owens is sick and she is helping to take care of him. He has brain cancer. He lives 5 minutes away. She does wish to spend as much time as possible with Andrea Owens so does not wish to pursue colonoscopy at current time.  Will do cologuard.   She battles seasonal allergies and taking Xyzal.  She is also on Allegra D and Flonase. She reports she is having significant nasal congestion .  She does see Dr. Posey Pronto for bilateral feet pain L> R.   Andrea Owens blood pressure has been controlled at home, she is on HCTZ 25mg  daily and metoprolol 50mg  BID, today their BP is     BP Readings from Last 3 Encounters:  04/22/22 (!) 160/90  10/13/21 140/90  09/23/21 120/76    BMI is There is no height or weight on file to calculate BMI., she is working on diet and exercise. Wt Readings from  Last 3 Encounters:  04/22/22 153 lb (69.4 kg)  10/13/21 148 lb 12.8 oz (67.5 kg)  09/23/21 151 lb (68.5 kg)    She does workout, she is walking. She denies chest pain, shortness of breath, dizziness.   She continues to smoke, about a 1/2 a pack a day. She has tried chantix and did not like the feeling.  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 She is on the 150mg  daily.    She is on cholesterol medication and denies myalgias, on Rosuvastatin 10mg  nightly. Andrea Owens cholesterol is at  goal. The cholesterol last visit was:   Lab Results  Component Value Date   CHOL 160 04/22/2022   HDL 51 04/22/2022   LDLCALC 82 04/22/2022   TRIG 175 (H) 04/22/2022   CHOLHDL 3.1 04/22/2022    She has been working on diet and exercise for prediabetes, and denies paresthesia of the feet, polydipsia, polyuria and visual disturbances. Last A1C in the office was:  Lab Results  Component Value Date   HGBA1C 5.4 10/13/2021   Patient is on Vitamin D supplement.   Lab Results  Component Value Date   VD25OH 57 10/13/2021      Current Medications:     Current Outpatient Medications (Cardiovascular):    hydrochlorothiazide (HYDRODIURIL) 25 MG tablet, TAKE 1 TABLET BY MOUTH  DAILY FOR BLOOD PRESSURE  AND FLUID RETENTION, ANKLE  SWELLING   metoprolol tartrate (LOPRESSOR) 50 MG tablet, TAKE 1 TABLET BY MOUTH 2  TIMES DAILY (EVERY 12  HOURS) FOR BLOOD PRESSURE   rosuvastatin (CRESTOR) 10 MG tablet, Take 1 tablet (10 mg total) by mouth at bedtime.  Current Outpatient Medications (Respiratory):    fexofenadine-pseudoephedrine (ALLEGRA-D 24) 180-240 MG 24 hr tablet, Take  1 tablet  Daily  for Sinus Allergies & Congestion   fluticasone (FLONASE) 50 MCG/ACT nasal spray, Place 2 sprays into both nostrils at bedtime.   levocetirizine (XYZAL) 5 MG tablet, TAKE 1 TABLET BY MOUTH  DAILY FOR ALLERGIES (Patient not taking: Reported on 04/22/2022)   montelukast (SINGULAIR) 10 MG tablet, TAKE 1 TABLET BY MOUTH DAILY  Current Outpatient Medications (Analgesics):    acetaminophen (TYLENOL) 500 MG tablet, Take 1,000 mg by mouth every 6 (six) hours as needed.   naproxen (NAPROSYN) 500 MG tablet, Take 1 tablet (500 mg total) by mouth 2 (two) times daily. (Patient taking differently: Take 500 mg by mouth as needed.)  Current Outpatient Medications (Hematological):    Cyanocobalamin (VITAMIN B 12 PO), Take by mouth daily.  Current Outpatient Medications (Other):    buPROPion (WELLBUTRIN XL) 300 MG 24 hr  tablet, TAKE 1 TABLET BY MOUTH  EVERY MORNING FOR MOOD,  FOCUS AND CONCENTRATION   cholecalciferol (VITAMIN D) 1000 UNITS tablet, Take 2,000 Units by mouth daily.   cyclobenzaprine (FLEXERIL) 10 MG tablet, Take 1/2 to 1 tablet 3 x /day as needed for Muscle Spasm   dicyclomine (BENTYL) 20 MG tablet, Take  1/2 to 1 tablet  3 x /day  before Meals for Abdominal Discomfort / Cramping (Patient taking differently: as needed. Take  1/2 to 1 tablet  3 x /day  before Meals for Abdominal Discomfort / Cramping)   famotidine (PEPCID) 40 MG tablet, Take 40 mg by mouth daily.   gabapentin (NEURONTIN) 100 MG capsule, TAKE 1 CAPSULE BY MOUTH 3  TIMES DAILY (Patient taking differently: as needed.)   Magnesium 250 MG TABS, Take 250 mg by mouth daily.  TURMERIC PO, Take 500 mg by mouth.  Medical History:  Past Medical History:  Diagnosis Date   Allergy    Endometriosis    GERD (gastroesophageal reflux disease)    Hyperlipidemia    Hypertension    Migraine    Prediabetes    Vitamin D deficiency    Allergies:  Allergies  Allergen Reactions   Penicillins     rash     Names of Other Physician/Practitioners you currently use: 1. [facility] here for primary care 2. Eye exam: Due, [place]- bilateral 2005, discussed with patient 3.Dental exam: 07/2020   Patient Care Team: Unk Pinto, MD as PCP - General (Internal Medicine) Pyrtle, Lajuan Lines, MD as Consulting Physician (Gastroenterology) Louretta Shorten, MD as Consulting Physician (Obstetrics and Gynecology)    Screening Tests: Immunization History  Administered Date(s) Administered   PFIZER(Purple Top)SARS-COV-2 Vaccination 10/11/2019, 11/07/2019   Tdap 07/17/2012    Preventative care: Last colonoscopy: N/A //Last mammogram: 8/20220 Last pap smear/pelvic exam: 02/2020 DEXA:N/A SARS-COV2-Pfizer 06/2020   Vaccinations: TD or Tdap: 2014  Influenza: Due for 2022 Pneumococcal: N/A Prevnar13:  N/A Shingles/Zostavax: N/A    Review of Systems:  Review of Systems  Constitutional: Negative.  Negative for chills and fever.  HENT:  Positive for congestion. Negative for ear discharge, ear pain, hearing loss, nosebleeds, sinus pain, sore throat and tinnitus.        + TMJ, wears night guard  Eyes:  Negative for blurred vision and double vision.  Respiratory:  Positive for wheezing (infrequent). Negative for cough, hemoptysis, sputum production, shortness of breath and stridor.   Cardiovascular: Negative.  Negative for chest pain, palpitations and leg swelling.  Gastrointestinal: Negative.  Negative for abdominal pain, constipation, diarrhea, heartburn, nausea and vomiting.  Genitourinary: Negative.  Negative for dysuria and urgency.  Musculoskeletal:  Positive for myalgias (left foot). Negative for back pain, falls, joint pain and neck pain.  Skin: Negative.  Negative for rash.  Neurological:  Negative for dizziness, tingling, tremors, sensory change, seizures, weakness and headaches.  Endo/Heme/Allergies:  Does not bruise/bleed easily.  Psychiatric/Behavioral: Negative.  Negative for depression and suicidal ideas. The patient is not nervous/anxious and does not have insomnia.     Family history- Review and unchanged Social history- Review and unchanged  Physical Exam: LMP 03/15/2015  Wt Readings from Last 3 Encounters:  04/22/22 153 lb (69.4 kg)  10/13/21 148 lb 12.8 oz (67.5 kg)  09/23/21 151 lb (68.5 kg)   General Appearance: Well nourished, in no apparent distress. Eyes: PERRLA, EOMs, conjunctiva no swelling or erythema Sinuses: No Frontal/maxillary tenderness ENT/Mouth: Ext aud canals clear, TMs without erythema, bulging. No erythema, swelling, or exudate on post pharynx.  Tonsils not swollen or erythematous. Hearing normal.  Neck: Supple, thyroid with small nodules on the left.  Respiratory: Respiratory effort normal, BS equal bilaterally without rales, rhonchi Expiratory  wheezing top lobes bilaterally that clears with cough. Cardio: RRR with no MRGs. Brisk peripheral pulses without edema.  Abdomen: Soft, + BS, nontender, no guarding, rebound, hernias, masses. Lymphatics: Non tender without lymphadenopathy.  Musculoskeletal:  No warmth, erythema. 5/5 strength Skin: dry without rashes, lesions, ecchymosis.  Bilateral feet, sensation intact.   Neuro: Cranial nerves intact. Normal muscle tone, no cerebellar symptoms. Psych: Awake and oriented X 3, normal affect, Insight and Judgment appropriate.  EKG: NSR, no ST changes   [facility]  10/13/2022

## 2022-10-14 ENCOUNTER — Encounter: Payer: Self-pay | Admitting: Nurse Practitioner

## 2022-10-14 ENCOUNTER — Ambulatory Visit (INDEPENDENT_AMBULATORY_CARE_PROVIDER_SITE_OTHER): Payer: BC Managed Care – PPO | Admitting: Nurse Practitioner

## 2022-10-14 VITALS — BP 160/100 | HR 69 | Temp 97.7°F | Ht 64.25 in | Wt 157.6 lb

## 2022-10-14 DIAGNOSIS — Z131 Encounter for screening for diabetes mellitus: Secondary | ICD-10-CM

## 2022-10-14 DIAGNOSIS — E7841 Elevated Lipoprotein(a): Secondary | ICD-10-CM

## 2022-10-14 DIAGNOSIS — Z1329 Encounter for screening for other suspected endocrine disorder: Secondary | ICD-10-CM

## 2022-10-14 DIAGNOSIS — Z13220 Encounter for screening for lipoid disorders: Secondary | ICD-10-CM | POA: Diagnosis not present

## 2022-10-14 DIAGNOSIS — Z1389 Encounter for screening for other disorder: Secondary | ICD-10-CM | POA: Diagnosis not present

## 2022-10-14 DIAGNOSIS — M62830 Muscle spasm of back: Secondary | ICD-10-CM

## 2022-10-14 DIAGNOSIS — Z136 Encounter for screening for cardiovascular disorders: Secondary | ICD-10-CM | POA: Diagnosis not present

## 2022-10-14 DIAGNOSIS — R232 Flushing: Secondary | ICD-10-CM

## 2022-10-14 DIAGNOSIS — Z79899 Other long term (current) drug therapy: Secondary | ICD-10-CM | POA: Diagnosis not present

## 2022-10-14 DIAGNOSIS — I10 Essential (primary) hypertension: Secondary | ICD-10-CM

## 2022-10-14 DIAGNOSIS — G43809 Other migraine, not intractable, without status migrainosus: Secondary | ICD-10-CM

## 2022-10-14 DIAGNOSIS — R7309 Other abnormal glucose: Secondary | ICD-10-CM

## 2022-10-14 DIAGNOSIS — E559 Vitamin D deficiency, unspecified: Secondary | ICD-10-CM

## 2022-10-14 DIAGNOSIS — I700 Atherosclerosis of aorta: Secondary | ICD-10-CM

## 2022-10-14 DIAGNOSIS — Z0000 Encounter for general adult medical examination without abnormal findings: Secondary | ICD-10-CM | POA: Diagnosis not present

## 2022-10-14 DIAGNOSIS — Z0001 Encounter for general adult medical examination with abnormal findings: Secondary | ICD-10-CM

## 2022-10-14 DIAGNOSIS — Z9109 Other allergy status, other than to drugs and biological substances: Secondary | ICD-10-CM

## 2022-10-14 DIAGNOSIS — F17200 Nicotine dependence, unspecified, uncomplicated: Secondary | ICD-10-CM

## 2022-10-14 DIAGNOSIS — E785 Hyperlipidemia, unspecified: Secondary | ICD-10-CM

## 2022-10-14 DIAGNOSIS — K824 Cholesterolosis of gallbladder: Secondary | ICD-10-CM

## 2022-10-14 DIAGNOSIS — G8929 Other chronic pain: Secondary | ICD-10-CM

## 2022-10-14 DIAGNOSIS — R61 Generalized hyperhidrosis: Secondary | ICD-10-CM

## 2022-10-14 DIAGNOSIS — Z8249 Family history of ischemic heart disease and other diseases of the circulatory system: Secondary | ICD-10-CM

## 2022-10-14 MED ORDER — METHOCARBAMOL 500 MG PO TABS: 500.0000 mg | ORAL_TABLET | Freq: Three times a day (TID) | ORAL | 1 refills | Status: DC | PRN

## 2022-10-14 MED ORDER — LOSARTAN POTASSIUM 25 MG PO TABS: 25.0000 mg | ORAL_TABLET | Freq: Every day | ORAL | 3 refills | Status: DC

## 2022-10-14 NOTE — Patient Instructions (Signed)
Start Losartan 25 mg daily.  Check BP daily and keep log.  If BP consistently greater than 130/80 notify the office  Heat, ibuprofen 600 mg q 6 hours and methocarbomol muscle relaxant for muscle spasm.   Muscle Cramps and Spasms Muscle cramps and spasms happen when muscles tighten on their own. They can be in any muscle. They happen most often in the muscles in the back of your lower leg (calf). Muscle cramps are painful. They are often stronger and last longer than muscle spasms. Muscle spasms may or may not be painful. In many cases, the cause of a muscle cramp or spasm is not known. But it may be from: Doing more work or exercise than your body is ready for. Using the muscles too much (overuse). Staying in one position for too long. Not preparing enough or having bad form when you play a sport or do an activity. Getting hurt. Other causes may include: Not enough water in your body (dehydration). Taking certain medicines. Not having enough salts and minerals in the body (electrolytes). Follow these instructions at home: Eating and drinking Drink enough fluid to keep your pee (urine) pale yellow. Eat a healthy diet to help your muscles work well. Your diet should include: Fruits and vegetables. Lean protein. Whole grains. Low-fat or nonfat dairy products. Managing pain and stiffness     Massage, stretch, and relax the muscle. Do this for a few minutes at a time. If told, put ice on the muscle. This may help if you are sore or have pain after a cramp or spasm. Put ice in a plastic bag. Place a towel between your skin and the bag. Leave the ice on for 20 minutes, 2-3 times a day. If told, put heat on tight or tense muscles. Do this as often as told by your doctor. Use the heat source that your doctor recommends, such as a moist heat pack or a heating pad. Place a towel between your skin and the heat source. Leave the heat on for 20-30 minutes. If your skin turns bright red, take  off the ice or heat right away to prevent skin damage. The risk of damage is higher if you cannot feel pain, heat, or cold. Take hot showers or baths to help relax the muscles. General instructions If you are having cramps often, avoid intense exercise for a few days. Take over-the-counter and prescription medicines only as told by your doctor. Watch for any changes in your symptoms. Contact a doctor if: Your cramps or spasms get worse or happen more often. Your cramps or spasms do not get better with time. This information is not intended to replace advice given to you by your health care provider. Make sure you discuss any questions you have with your health care provider. Document Revised: 02/16/2022 Document Reviewed: 02/16/2022 Elsevier Patient Education  [employer].

## 2022-10-15 LAB — URINALYSIS, ROUTINE W REFLEX MICROSCOPIC
Bilirubin Urine: NEGATIVE
Glucose, UA: NEGATIVE
Hgb urine dipstick: NEGATIVE
Ketones, ur: NEGATIVE
Leukocytes,Ua: NEGATIVE
Nitrite: NEGATIVE
Protein, ur: NEGATIVE
Specific Gravity, Urine: 1.01 (ref 1.001–1.035)
pH: 7.5 (ref 5.0–8.0)

## 2022-10-15 LAB — CBC WITH DIFFERENTIAL/PLATELET
Absolute Monocytes: 593 cells/uL (ref 200–950)
Basophils Absolute: 39 cells/uL (ref 0–200)
Basophils Relative: 0.5 %
Eosinophils Absolute: 100 cells/uL (ref 15–500)
Eosinophils Relative: 1.3 %
HCT: 41.1 % (ref 35.0–45.0)
Hemoglobin: 13.9 g/dL (ref 11.7–15.5)
Lymphs Abs: 2571.8 cells/uL (ref 850–3900)
MCH: 33.2 pg — ABNORMAL HIGH (ref 27.0–33.0)
MCHC: 33.8 g/dL (ref 32.0–36.0)
MCV: 98.1 fL (ref 80.0–100.0)
MPV: 14.3 fL — ABNORMAL HIGH (ref 7.5–12.5)
Monocytes Relative: 7.7 %
Neutro Abs: 4397 cells/uL (ref 1500–7800)
Neutrophils Relative %: 57.1 %
Platelets: 135 10*3/uL — ABNORMAL LOW (ref 140–400)
RBC: 4.19 10*6/uL (ref 3.80–5.10)
RDW: 11.9 % (ref 11.0–15.0)
Total Lymphocyte: 33.4 %
WBC: 7.7 10*3/uL (ref 3.8–10.8)

## 2022-10-15 LAB — LIPID PANEL
Cholesterol: 153 mg/dL (ref <200)
HDL: 56 mg/dL (ref >=50)
LDL Cholesterol (Calc): 80 mg/dL (calc)
Non-HDL Cholesterol (Calc): 97 mg/dL (calc) (ref <130)
Total CHOL/HDL Ratio: 2.7 (calc) (ref <5.0)
Triglycerides: 83 mg/dL (ref <150)

## 2022-10-15 LAB — COMPLETE METABOLIC PANEL WITH GFR
AG Ratio: 2.2 (calc) (ref 1.0–2.5)
ALT: 19 U/L (ref 6–29)
AST: 18 U/L (ref 10–35)
Albumin: 4.6 g/dL (ref 3.6–5.1)
Alkaline phosphatase (APISO): 65 U/L (ref 31–125)
BUN: 10 mg/dL (ref 7–25)
CO2: 31 mmol/L (ref 20–32)
Calcium: 9.8 mg/dL (ref 8.6–10.2)
Chloride: 106 mmol/L (ref 98–110)
Creat: 0.71 mg/dL (ref 0.50–0.99)
Globulin: 2.1 g/dL (calc) (ref 1.9–3.7)
Glucose, Bld: 84 mg/dL (ref 65–99)
Potassium: 4.4 mmol/L (ref 3.5–5.3)
Sodium: 145 mmol/L (ref 135–146)
Total Bilirubin: 0.3 mg/dL (ref 0.2–1.2)
Total Protein: 6.7 g/dL (ref 6.1–8.1)
eGFR: 105 mL/min/{1.73_m2} (ref >=60)

## 2022-10-15 LAB — HEMOGLOBIN A1C
Hgb A1c MFr Bld: 5.6 % of total Hgb (ref <5.7)
Mean Plasma Glucose: 114 mg/dL
eAG (mmol/L): 6.3 mmol/L

## 2022-10-15 LAB — MICROALBUMIN / CREATININE URINE RATIO
Creatinine, Urine: 39 mg/dL (ref 20–275)
Microalb, Ur: <0.2 mg/dL

## 2022-10-15 LAB — TSH: TSH: 0.83 mIU/L

## 2022-10-15 LAB — VITAMIN D 25 HYDROXY (VIT D DEFICIENCY, FRACTURES): Vit D, 25-Hydroxy: 57 ng/mL (ref 30–100)

## 2022-10-15 LAB — MAGNESIUM: Magnesium: 2.2 mg/dL (ref 1.5–2.5)

## 2022-10-20 ENCOUNTER — Encounter: Payer: BC Managed Care – PPO | Admitting: Internal Medicine

## 2022-11-17 NOTE — Progress Notes (Signed)
Assessment and Plan:  Andrea Owens was seen today for follow-up.  Diagnoses and all orders for this visit:  Essential hypertension - continue DASH diet, exercise and monitor at home. Call if greater than 130/80.  -Stop Losartan -Start Olmesartan 40 mg every night, monitor BP if within a week it is not lower than 130/80 notify the office Follow up in 3 months or sooner if BP not controlled Go to the ER if any chest pain, shortness of breath, nausea, dizziness, severe HA, changes vision/speech  -     olmesartan (BENICAR) 40 MG tablet; Take 1 tablet (40 mg total) by mouth daily.  Lesion of skin of nose Right side of nose < 1 cm two color raised lesion, will refer to derm for evaluation -     Ambulatory referral to Dermatology  Spasm of thoracic back muscle Change muscle relaxant from methocarbamol to cyclobenzaprine. Get standing desk Do back strengthening exercises below -     cyclobenzaprine (FLEXERIL) 5 MG tablet; Take 1 tablet (5 mg total) by mouth 3 (three) times daily as needed for muscle spasms.       Further disposition pending results of labs. Discussed med's effects and SE's.   Over 30 minutes of exam, counseling, chart review, and critical decision making was performed.   Future Appointments  Date Time Provider Department Center  11/18/2022  9:30 AM Raynelle Dick, NP [facility] None  10/14/2023  9:00 AM Raynelle Dick, NP [facility] None    ------------------------------------------------------------------------------------------------------------------   HPI BP (!) 166/96   Pulse (!) 59   Temp 97.7 F (36.5 C)   Ht 5' 4.25" (1.632 m)   Wt 156 lb 9.6 oz (71 kg)   LMP 03/15/2015   SpO2 97%   BMI 26.67 kg/m    47 y.o.female presents for reevaluation of blood pressure  She was started on Losartan 25 mg approximately 5 weeks ago in addition to her HCTZ 25mg  and Metoprolol 50 mg BID that she has been on and BP continues to be elevated.  She has a chipped tooth  and can not get it repaired because BP remains elevated. BP's at home have been running 130-170/90's.  She does have some headaches and occ blurred vision.  BP Readings from Last 3 Encounters:  11/18/22 (!) 166/96  10/14/22 (!) 160/100  04/22/22 (!) 160/90   BMI is Body mass index is 26.67 kg/m., she has been working on diet and exercise. Wt Readings from Last 3 Encounters:  11/18/22 156 lb 9.6 oz (71 kg)  10/14/22 157 lb 9.6 oz (71.5 kg)  04/22/22 153 lb (69.4 kg)   She continues to have an aching pain in left thoracic area. 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   buPROPion (WELLBUTRIN XL) 300 MG 24 hr tablet TAKE 1 TABLET BY MOUTH  EVERY MORNING FOR MOOD,  FOCUS AND CONCENTRATION   cholecalciferol (VITAMIN D) 1000  UNITS tablet Take 2,000 Units by mouth daily.   Cyanocobalamin (VITAMIN B 12 PO) Take by mouth daily.   dicyclomine (BENTYL) 20 MG tablet Take  1/2 to 1 tablet  3 x /day  before Meals for Abdominal Discomfort / Cramping (Patient taking differently: as needed. Take  1/2 to 1 tablet  3 x /day  before Meals for Abdominal Discomfort / Cramping)   estradiol (DOTTI) 0.1 MG/24HR patch Apply 1 patch twice a week by transdermal route as directed.   estradiol (ESTRACE) 0.1 MG/GM vaginal cream INSERT 1 GRAM VAGINALLY 2 TIMES A WEEK AT BEDTIME   famotidine (PEPCID) 40 MG tablet Take 40 mg by mouth daily.   fluticasone (FLONASE) 50 MCG/ACT nasal spray Place 2 sprays into both nostrils at bedtime.    hydrochlorothiazide (HYDRODIURIL) 25 MG tablet TAKE 1 TABLET BY MOUTH  DAILY FOR BLOOD PRESSURE  AND FLUID RETENTION, ANKLE  SWELLING   losartan (COZAAR) 25 MG tablet Take 1 tablet (25 mg total) by mouth daily.   Magnesium 250 MG TABS Take 250 mg by mouth daily.   methocarbamol (ROBAXIN) 500 MG tablet Take 1 tablet (500 mg total) by mouth every 8 (eight) hours as needed for muscle spasms.   metoprolol tartrate (LOPRESSOR) 50 MG tablet TAKE 1 TABLET BY MOUTH 2  TIMES DAILY (EVERY 12  HOURS) FOR BLOOD PRESSURE   montelukast (SINGULAIR) 10 MG tablet TAKE 1 TABLET BY MOUTH DAILY   rosuvastatin (CRESTOR) 10 MG tablet Take 1 tablet (10 mg total) by mouth at bedtime.   TURMERIC PO Take 500 mg by mouth.   No current facility-administered medications on file prior to visit.    ROS: all negative except above.   Physical Exam:  BP (!) 166/96   Pulse (!) 59   Temp 97.7 F (36.5 C)   Ht 5' 4.25" (1.632 m)   Wt 156 lb 9.6 oz (71 kg)   LMP 03/15/2015   SpO2 97%   BMI 26.67 kg/m   General Appearance: Well nourished, in no apparent distress. Eyes: PERRLA, EOMs, conjunctiva no swelling or erythema Neck: Supple, thyroid normal.  Respiratory: Respiratory effort normal, BS equal bilaterally without rales, rhonchi, wheezing or stridor.  Cardio: RRR with no MRGs. Brisk peripheral pulses without edema.  Abdomen: Soft, + BS.  Non tender, no guarding, rebound, hernias, masses. Lymphatics: Non tender without lymphadenopathy.  Musculoskeletal: Full ROM, 5/5 strength, normal gait. Muscle spasm noted of left thoracic area Skin: Warm, dry. Right side of nose < 1 cm two color raised lesion Neuro: Cranial nerves intact. Normal muscle tone, no cerebellar symptoms. Sensation intact.  Psych: Awake and oriented X 3, normal affect, Insight and Judgment appropriate.     Raynelle Dick, NP 9:26 AM [name]

## 2022-11-18 ENCOUNTER — Ambulatory Visit: Payer: BC Managed Care – PPO | Admitting: Nurse Practitioner

## 2022-11-18 ENCOUNTER — Encounter: Payer: Self-pay | Admitting: Nurse Practitioner

## 2022-11-18 VITALS — BP 166/96 | HR 59 | Temp 97.7°F | Ht 64.25 in | Wt 156.6 lb

## 2022-11-18 DIAGNOSIS — M62830 Muscle spasm of back: Secondary | ICD-10-CM

## 2022-11-18 DIAGNOSIS — I10 Essential (primary) hypertension: Secondary | ICD-10-CM | POA: Diagnosis not present

## 2022-11-18 DIAGNOSIS — L989 Disorder of the skin and subcutaneous tissue, unspecified: Secondary | ICD-10-CM | POA: Diagnosis not present

## 2022-11-18 MED ORDER — OLMESARTAN MEDOXOMIL 40 MG PO TABS
40.0000 mg | ORAL_TABLET | Freq: Every day | ORAL | 11 refills | Status: DC
Start: 2022-11-18 — End: 2023-05-16

## 2022-11-18 MED ORDER — CYCLOBENZAPRINE HCL 5 MG PO TABS
5.0000 mg | ORAL_TABLET | Freq: Three times a day (TID) | ORAL | 0 refills | Status: AC | PRN
Start: 2022-11-18 — End: ?

## 2022-11-18 NOTE — Patient Instructions (Addendum)
Stop Losartan Start Olmesartan 40 mg every night, monitor BP if within a week it is not lower than 130/80 notify the office  Change muscle relaxant from methocarbomol to cyclobenzaprine. Get standing desk Do back strengthening exercises below  Referral to dermatology for skin lesion of nose- they will call you with appointment  Hypertension, Adult High blood pressure (hypertension) is when the force of blood pumping through the arteries is too strong. The arteries are the blood vessels that carry blood from the heart throughout the body. Hypertension forces the heart to work harder to pump blood and may cause arteries to become narrow or stiff. Untreated or uncontrolled hypertension can lead to a heart attack, heart failure, a stroke, kidney disease, and other problems. A blood pressure reading consists of a higher number over a lower number. Ideally, your blood pressure should be below 120/80. The first ("top") number is called the systolic pressure. It is a measure of the pressure in your arteries as your heart beats. The second ("bottom") number is called the diastolic pressure. It is a measure of the pressure in your arteries as the heart relaxes. What are the causes? The exact cause of this condition is not known. There are some conditions that result in high blood pressure. What increases the risk? Certain factors may make you more likely to develop high blood pressure. Some of these risk factors are under your control, including: Smoking. Not getting enough exercise or physical activity. Being overweight. Having too much fat, sugar, calories, or salt (sodium) in your diet. Drinking too much alcohol. Other risk factors include: Having a personal history of heart disease, diabetes, high cholesterol, or kidney disease. Stress. Having a family history of high blood pressure and high cholesterol. Having obstructive sleep apnea. Age. The risk increases with age. What are the signs or  symptoms? High blood pressure may not cause symptoms. Very high blood pressure (hypertensive crisis) may cause: Headache. Fast or irregular heartbeats (palpitations). Shortness of breath. Nosebleed. Nausea and vomiting. Vision changes. Severe chest pain, dizziness, and seizures. How is this diagnosed? This condition is diagnosed by measuring your blood pressure while you are seated, with your arm resting on a flat surface, your legs uncrossed, and your feet flat on the floor. The cuff of the blood pressure monitor will be placed directly against the skin of your upper arm at the level of your heart. Blood pressure should be measured at least twice using the same arm. Certain conditions can cause a difference in blood pressure between your right and left arms. If you have a high blood pressure reading during one visit or you have normal blood pressure with other risk factors, you may be asked to: Return on a different day to have your blood pressure checked again. Monitor your blood pressure at home for 1 week or longer. If you are diagnosed with hypertension, you may have other blood or imaging tests to help your health care provider understand your overall risk for other conditions. How is this treated? This condition is treated by making healthy lifestyle changes, such as eating healthy foods, exercising more, and reducing your alcohol intake. You may be referred for counseling on a healthy diet and physical activity. Your health care provider may prescribe medicine if lifestyle changes are not enough to get your blood pressure under control and if: Your systolic blood pressure is above 130. Your diastolic blood pressure is above 80. Your personal target blood pressure may vary depending on your medical conditions, your  age, and other factors. Follow these instructions at home: Eating and drinking  Eat a diet that is high in fiber and potassium, and low in sodium, added sugar, and fat. An  example of this eating plan is called the DASH diet. DASH stands for Dietary Approaches to Stop Hypertension. To eat this way: Eat plenty of fresh fruits and vegetables. Try to fill one half of your plate at each meal with fruits and vegetables. Eat whole grains, such as whole-wheat pasta, brown rice, or whole-grain bread. Fill about one fourth of your plate with whole grains. Eat or drink low-fat dairy products, such as skim milk or low-fat yogurt. Avoid fatty cuts of meat, processed or cured meats, and poultry with skin. Fill about one fourth of your plate with lean proteins, such as fish, chicken without skin, beans, eggs, or tofu. Avoid pre-made and processed foods. These tend to be higher in sodium, added sugar, and fat. Reduce your daily sodium intake. Many people with hypertension should eat less than 1,500 mg of sodium a day. Do not drink alcohol if: Your health care provider tells you not to drink. You are pregnant, may be pregnant, or are planning to become pregnant. If you drink alcohol: Limit how much you have to: 0-1 drink a day for women. 0-2 drinks a day for men. Know how much alcohol is in your drink. In the [place] one drink equals one 12 oz bottle of beer (355 mL), one 5 oz glass of wine (148 mL), or one 1 oz glass of hard liquor (44 mL). Lifestyle  Work with your health care provider to maintain a healthy body weight or to lose weight. Ask what an ideal weight is for you. Get at least 30 minutes of exercise that causes your heart to beat faster (aerobic exercise) most days of the week. Activities may include walking, swimming, or biking. Include exercise to strengthen your muscles (resistance exercise), such as Pilates or lifting weights, as part of your weekly exercise routine. Try to do these types of exercises for 30 minutes at least 3 days a week. Do not use any products that contain nicotine or tobacco. These products include cigarettes, chewing tobacco, and vaping devices,  such as e-cigarettes. If you need help quitting, ask your health care provider. Monitor your blood pressure at home as told by your health care provider. Keep all follow-up visits. This is important. Medicines Take over-the-counter and prescription medicines only as told by your health care provider. Follow directions carefully. Blood pressure medicines must be taken as prescribed. Do not skip doses of blood pressure medicine. Doing this puts you at risk for problems and can make the medicine less effective. Ask your health care provider about side effects or reactions to medicines that you should watch for. Contact a health care provider if you: Think you are having a reaction to a medicine you are taking. Have headaches that keep coming back (recurring). Feel dizzy. Have swelling in your ankles. Have trouble with your vision. Get help right away if you: Develop a severe headache or confusion. Have unusual weakness or numbness. Feel faint. Have severe pain in your chest or abdomen. Vomit repeatedly. Have trouble breathing. These symptoms may be an emergency. Get help right away. Call 911. Do not wait to see if the symptoms will go away. Do not drive yourself to the hospital. Summary Hypertension is when the force of blood pumping through your arteries is too strong. If this condition is not controlled, it may  put you at risk for serious complications. Your personal target blood pressure may vary depending on your medical conditions, your age, and other factors. For most people, a normal blood pressure is less than 120/80. Hypertension is treated with lifestyle changes, medicines, or a combination of both. Lifestyle changes include losing weight, eating a healthy, low-sodium diet, exercising more, and limiting alcohol. This information is not intended to replace advice given to you by your health care provider. Make sure you discuss any questions you have with your health care  provider. Document Revised: 05/05/2021 Document Reviewed: 05/05/2021 Elsevier Patient Education  2023 [employer]  Back Exercises These exercises help to make your trunk and back strong. They also help to keep the lower back flexible. Doing these exercises can help to prevent or lessen pain in your lower back. If you have back pain, try to do these exercises 2-3 times each day or as told by your doctor. As you get better, do the exercises once each day. Repeat the exercises more often as told by your doctor. To stop back pain from coming back, do the exercises once each day, or as told by your doctor. Do exercises exactly as told by your doctor. Stop right away if you feel sudden pain or your pain gets worse. Exercises Single knee to chest Do these steps 3-5 times in a row for each leg: Lie on your back on a firm bed or the floor with your legs stretched out. Bring one knee to your chest. Grab your knee or thigh with both hands and hold it in place. Pull on your knee until you feel a gentle stretch in your lower back or butt. Keep doing the stretch for 10-30 seconds. Slowly let go of your leg and straighten it. Pelvic tilt Do these steps 5-10 times in a row: Lie on your back on a firm bed or the floor with your legs stretched out. Bend your knees so they point up to the ceiling. Your feet should be flat on the floor. Tighten your lower belly (abdomen) muscles to press your lower back against the floor. This will make your tailbone point up to the ceiling instead of pointing down to your feet or the floor. Stay in this position for 5-10 seconds while you gently tighten your muscles and breathe evenly. Cat-cow Do these steps until your lower back bends more easily: Get on your hands and knees on a firm bed or the floor. Keep your hands under your shoulders, and keep your knees under your hips. You may put padding under your knees. Let your head hang down toward your chest. Tighten  (contract) the muscles in your belly. Point your tailbone toward the floor so your lower back becomes rounded like the back of a cat. Stay in this position for 5 seconds. Slowly lift your head. Let the muscles of your belly relax. Point your tailbone up toward the ceiling so your back forms a sagging arch like the back of a cow. Stay in this position for 5 seconds.  Press-ups Do these steps 5-10 times in a row: Lie on your belly (face-down) on a firm bed or the floor. Place your hands near your head, about shoulder-width apart. While you keep your back relaxed and keep your hips on the floor, slowly straighten your arms to raise the top half of your body and lift your shoulders. Do not use your back muscles. You may change where you place your hands to make yourself more comfortable.  Stay in this position for 5 seconds. Keep your back relaxed. Slowly return to lying flat on the floor.  Bridges Do these steps 10 times in a row: Lie on your back on a firm bed or the floor. Bend your knees so they point up to the ceiling. Your feet should be flat on the floor. Your arms should be flat at your sides, next to your body. Tighten your butt muscles and lift your butt off the floor until your waist is almost as high as your knees. If you do not feel the muscles working in your butt and the back of your thighs, slide your feet 1-2 inches (2.5-5 cm) farther away from your butt. Stay in this position for 3-5 seconds. Slowly lower your butt to the floor, and let your butt muscles relax. If this exercise is too easy, try doing it with your arms crossed over your chest. Belly crunches Do these steps 5-10 times in a row: Lie on your back on a firm bed or the floor with your legs stretched out. Bend your knees so they point up to the ceiling. Your feet should be flat on the floor. Cross your arms over your chest. Tip your chin a little bit toward your chest, but do not bend your neck. Tighten your belly  muscles and slowly raise your chest just enough to lift your shoulder blades a tiny bit off the floor. Avoid raising your body higher than that because it can put too much stress on your lower back. Slowly lower your chest and your head to the floor. Back lifts Do these steps 5-10 times in a row: Lie on your belly (face-down) with your arms at your sides, and rest your forehead on the floor. Tighten the muscles in your legs and your butt. Slowly lift your chest off the floor while you keep your hips on the floor. Keep the back of your head in line with the curve in your back. Look at the floor while you do this. Stay in this position for 3-5 seconds. Slowly lower your chest and your face to the floor. Contact a doctor if: Your back pain gets a lot worse when you do an exercise. Your back pain does not get better within 2 hours after you exercise. If you have any of these problems, stop doing the exercises. Do not do them again unless your doctor says it is okay. Get help right away if: You have sudden, very bad back pain. If this happens, stop doing the exercises. Do not do them again unless your doctor says it is okay. This information is not intended to replace advice given to you by your health care provider. Make sure you discuss any questions you have with your health care provider. Document Revised: 09/10/2020 Document Reviewed: 09/10/2020 Elsevier Patient Education  2023 [employer].

## 2022-12-20 ENCOUNTER — Other Ambulatory Visit: Payer: Self-pay

## 2022-12-20 DIAGNOSIS — F4321 Adjustment disorder with depressed mood: Secondary | ICD-10-CM

## 2022-12-20 MED ORDER — BUPROPION HCL ER (XL) 300 MG PO TB24: ORAL_TABLET | ORAL | 3 refills | Status: DC

## 2023-01-25 DIAGNOSIS — D2239 Melanocytic nevi of other parts of face: Secondary | ICD-10-CM | POA: Diagnosis not present

## 2023-01-25 DIAGNOSIS — L821 Other seborrheic keratosis: Secondary | ICD-10-CM | POA: Diagnosis not present

## 2023-01-25 DIAGNOSIS — L700 Acne vulgaris: Secondary | ICD-10-CM | POA: Diagnosis not present

## 2023-02-09 ENCOUNTER — Other Ambulatory Visit: Payer: Self-pay | Admitting: Nurse Practitioner

## 2023-02-09 DIAGNOSIS — I10 Essential (primary) hypertension: Secondary | ICD-10-CM

## 2023-02-09 DIAGNOSIS — Z0001 Encounter for general adult medical examination with abnormal findings: Secondary | ICD-10-CM

## 2023-03-08 NOTE — Progress Notes (Unsigned)
3 MONTH FOLLOW UP   Assessment and Plan:   Andrea Owens was seen today for annual exam.  Diagnoses and all orders for this visit:  Essential hypertension Continue current medications: Metoprolol 50mg  BID and HCTZ 25mg  daily in am. Start Losartan 25 mg daily- f/u in 1 month Monitor blood pressure at home; call if consistently over 130/80 Continue DASH diet.   Reminder to go to the ER if any CP, SOB, nausea, dizziness, severe HA, changes vision/speech, left arm numbness and tingling and jaw pain. -     CBC with Differential/Platelet -     COMPLETE METABOLIC PANEL WITH GFR -     TSH -     Magnesium -     EKG 12-Lead  Aortic Atherosclerosis by CT 09/2021(HCC) Control BP, Blood sugars, weight and cholesterol  Hyperlipidemia, unspecified hyperlipidemia type Elevated lipoprotein(a) Family history of early CAD Family history MI at 80 Cardiac calcium score at age 4? Discussed with patient -     Lipid panel  Tobacco use disorder Half a pack a day,she is trying to cut down Discussed smoking cessation Not ready to quit at this time  Abnormal glucose Discussed dietary and exercise modifications -     Hemoglobin A1c  Vitamin D deficiency Continue supplementation to maintain goal of 70-100 Taking Vitamin D 2,000 IU daily -     VITAMIN D 25 Hydroxy (Vit-D Deficiency, Fractures)   Medication management Continued     Continue diet and meds as discussed. Further disposition pending results of labs. Over 30 minutes of face to face interview, exam, counseling, chart review, and critical decision making was performed  Future Appointments  Date Time Provider Department Center  03/09/2023  9:30 AM Raynelle Dick, NP [facility] None  10/14/2023  9:00 AM Raynelle Dick, NP [facility] None     HPI 47 y.o. female  presents for 6 month follow up on HTN, HLD, prediabetes/abnormal glucose, and vitamin D deficiency.   Overall she is doing well.    Her father is sick and she is  helping to take care of him. He has brain cancer. He lives 5 minutes away.    Her blood pressure has been controlled at home, she is on HCTZ 25mg  daily and metoprolol 50mg  BID, today their BP is     BP Readings from Last 3 Encounters:  11/18/22 (!) 166/96  10/14/22 (!) 160/100  04/22/22 (!) 160/90    BMI is There is no height or weight on file to calculate BMI., she is working on diet and exercise. She is up 9 pounds since starting HRT- will discuss with OB/GYN Wt Readings from Last 3 Encounters:  11/18/22 156 lb 9.6 oz (71 kg)  10/14/22 157 lb 9.6 oz (71.5 kg)  04/22/22 153 lb (69.4 kg)    She does workout, she is walking. She denies chest pain, shortness of breath, dizziness.   She continues to smoke, about a 1/2 a pack a day. She has tried chantix and did not like the feeling.  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 She is on the 150mg  daily.    She is on cholesterol medication and denies myalgias, on Rosuvastatin 10mg  nightly. Her cholesterol is at goal. The cholesterol last visit was:   Lab Results  Component Value Date   CHOL 153 10/14/2022   HDL 56 10/14/2022   LDLCALC 80 10/14/2022   TRIG 83 10/14/2022   CHOLHDL 2.7 10/14/2022  She has been working on diet and exercise for prediabetes, and denies paresthesia of the feet, polydipsia, polyuria and visual disturbances. Last A1C in the office was:  Lab Results  Component Value Date   HGBA1C 5.6 10/14/2022   Patient is on Vitamin D supplement.   Lab Results  Component Value Date   VD25OH 57 10/14/2022      Current Medications:    Current Outpatient Medications (Endocrine & Metabolic):    estradiol (DOTTI) 0.1 MG/24HR patch, Apply 1 patch twice a week by transdermal route as directed.  Current Outpatient Medications (Cardiovascular):    hydrochlorothiazide (HYDRODIURIL) 25 MG tablet, TAKE 1 TABLET BY MOUTH DAILY FOR BLOOD PRESSURE AND FLUID  RETENTION, ANKLE SWELLING    metoprolol tartrate (LOPRESSOR) 50 MG tablet, TAKE 1 TABLET BY MOUTH EVERY 12  HOURS FOR BLOOD PRESSURE   olmesartan (BENICAR) 40 MG tablet, Take 1 tablet (40 mg total) by mouth daily.   rosuvastatin (CRESTOR) 10 MG tablet, Take 1 tablet (10 mg total) by mouth at bedtime.  Current Outpatient Medications (Respiratory):    fluticasone (FLONASE) 50 MCG/ACT nasal spray, Place 2 sprays into both nostrils at bedtime.   montelukast (SINGULAIR) 10 MG tablet, TAKE 1 TABLET BY MOUTH DAILY  Current Outpatient Medications (Analgesics):    acetaminophen (TYLENOL) 500 MG tablet, Take 1,000 mg by mouth every 6 (six) hours as needed.  Current Outpatient Medications (Hematological):    Cyanocobalamin (VITAMIN B 12 PO), Take by mouth daily.  Current Outpatient Medications (Other):    buPROPion (WELLBUTRIN XL) 300 MG 24 hr tablet, TAKE 1 TABLET BY MOUTH  EVERY MORNING FOR MOOD,  FOCUS AND CONCENTRATION   cholecalciferol (VITAMIN D) 1000 UNITS tablet, Take 2,000 Units by mouth daily.   cyclobenzaprine (FLEXERIL) 5 MG tablet, Take 1 tablet (5 mg total) by mouth 3 (three) times daily as needed for muscle spasms.   dicyclomine (BENTYL) 20 MG tablet, Take  1/2 to 1 tablet  3 x /day  before Meals for Abdominal Discomfort / Cramping (Patient taking differently: as needed. Take  1/2 to 1 tablet  3 x /day  before Meals for Abdominal Discomfort / Cramping)   estradiol (ESTRACE) 0.1 MG/GM vaginal cream, INSERT 1 GRAM VAGINALLY 2 TIMES A WEEK AT BEDTIME   famotidine (PEPCID) 40 MG tablet, Take 40 mg by mouth daily.   Magnesium 250 MG TABS, Take 250 mg by mouth daily.   TURMERIC PO, Take 500 mg by mouth.  Medical History:  Past Medical History:  Diagnosis Date   Allergy    Endometriosis    GERD (gastroesophageal reflux disease)    Hyperlipidemia    Hypertension    Migraine    Prediabetes    Vitamin D deficiency    Allergies:  Allergies  Allergen Reactions   Penicillins     rash     Names of Other  Physician/Practitioners you currently use: 1. [facility] here for primary care 2. Eye exam: Due, PRK- bilateral 2005, discussed with patient 3.Dental exam: 07/2020   Patient Care Team: Lucky Cowboy, MD as PCP - General (Internal Medicine) Pyrtle, Carie Caddy, MD as Consulting Physician (Gastroenterology) Candice Camp, MD as Consulting Physician (Obstetrics and Gynecology)    Screening Tests: Immunization History  Administered Date(s) Administered   PFIZER(Purple Top)SARS-COV-2 Vaccination 10/11/2019, 11/07/2019   Tdap 07/17/2012    Preventative care: Last colonoscopy: N/A //Last mammogram: 8/20220 Last pap smear/pelvic exam: 02/2020 DEXA:N/A SARS-COV2-[employer] 06/2020   Vaccinations: TD or Tdap: 2014  Influenza: Due for 2022 Pneumococcal: N/A Prevnar13: N/A Shingles/Zostavax: N/A    Review of Systems:  Review of Systems  Constitutional: Negative.  Negative for chills and fever.  HENT:  Negative for congestion, ear discharge, ear pain, hearing loss, nosebleeds, sinus pain, sore throat and tinnitus.        + TMJ, wears night guard  Eyes:  Negative for blurred vision and double vision.  Respiratory:  Positive for wheezing (infrequent). Negative for cough, hemoptysis, sputum production, shortness of breath and stridor.   Cardiovascular: Negative.  Negative for chest pain, palpitations and leg swelling.  Gastrointestinal: Negative.  Negative for abdominal pain, constipation, diarrhea, heartburn, nausea and vomiting.  Genitourinary: Negative.  Negative for dysuria and urgency.  Musculoskeletal:  Positive for back pain (thoracic area ache intermittent). Negative for falls, joint pain, myalgias (left foot) and neck pain.  Skin: Negative.  Negative for rash.  Neurological:  Negative for dizziness, tingling, tremors, sensory change, seizures, weakness and headaches.  Endo/Heme/Allergies:  Does not bruise/bleed easily.  Psychiatric/Behavioral:  Negative.  Negative for depression and suicidal ideas. The patient is not nervous/anxious and does not have insomnia.     Family history- Review and unchanged Social history- Review and unchanged  Physical Exam: LMP 03/15/2015  Wt Readings from Last 3 Encounters:  11/18/22 156 lb 9.6 oz (71 kg)  10/14/22 157 lb 9.6 oz (71.5 kg)  04/22/22 153 lb (69.4 kg)   General Appearance: Well nourished, in no apparent distress. Eyes: PERRLA, EOMs, conjunctiva no swelling or erythema Sinuses: No Frontal/maxillary tenderness ENT/Mouth: Ext aud canals clear, TMs without erythema, bulging. No erythema, swelling, or exudate on post pharynx.  Tonsils not swollen or erythematous. Hearing normal.  Neck: Supple, thyroid with small nodules on the left.  Respiratory: Respiratory effort normal, BS equal bilaterally without rales, rhonchi Expiratory wheezing top lobes bilaterally that clears with cough. Cardio: RRR with no MRGs. Brisk peripheral pulses without edema.  Abdomen: Soft, + BS, nontender, no guarding, rebound, hernias, masses. Lymphatics: Non tender without lymphadenopathy.  Musculoskeletal:  No warmth, erythema. 5/5 strength. Muscle spasm noted in thoracic area of back on right side Skin: dry without rashes, lesions, ecchymosis.  Bilateral feet, sensation intact.   Neuro: Cranial nerves intact. Normal muscle tone, no cerebellar symptoms. Psych: Awake and oriented X 3, normal affect, Insight and Judgment appropriate.  Breast and Pelvic: defer to gyn  RJJ:OACZY bradycardia, no ST changes   [facility]  03/08/2023

## 2023-03-09 ENCOUNTER — Ambulatory Visit: Payer: BC Managed Care – PPO | Admitting: Nurse Practitioner

## 2023-03-09 ENCOUNTER — Encounter: Payer: Self-pay | Admitting: Nurse Practitioner

## 2023-03-09 ENCOUNTER — Other Ambulatory Visit: Payer: Self-pay | Admitting: Nurse Practitioner

## 2023-03-09 VITALS — BP 158/80 | HR 76 | Temp 97.9°F | Resp 16 | Ht 64.25 in | Wt 157.4 lb

## 2023-03-09 DIAGNOSIS — E785 Hyperlipidemia, unspecified: Secondary | ICD-10-CM

## 2023-03-09 DIAGNOSIS — I10 Essential (primary) hypertension: Secondary | ICD-10-CM | POA: Diagnosis not present

## 2023-03-09 DIAGNOSIS — R1011 Right upper quadrant pain: Secondary | ICD-10-CM

## 2023-03-09 DIAGNOSIS — Z8249 Family history of ischemic heart disease and other diseases of the circulatory system: Secondary | ICD-10-CM

## 2023-03-09 DIAGNOSIS — I700 Atherosclerosis of aorta: Secondary | ICD-10-CM | POA: Diagnosis not present

## 2023-03-09 DIAGNOSIS — R7309 Other abnormal glucose: Secondary | ICD-10-CM | POA: Diagnosis not present

## 2023-03-09 DIAGNOSIS — F17200 Nicotine dependence, unspecified, uncomplicated: Secondary | ICD-10-CM

## 2023-03-09 DIAGNOSIS — K219 Gastro-esophageal reflux disease without esophagitis: Secondary | ICD-10-CM

## 2023-03-09 DIAGNOSIS — R1013 Epigastric pain: Secondary | ICD-10-CM

## 2023-03-09 DIAGNOSIS — E559 Vitamin D deficiency, unspecified: Secondary | ICD-10-CM

## 2023-03-09 DIAGNOSIS — E7841 Elevated Lipoprotein(a): Secondary | ICD-10-CM

## 2023-03-09 DIAGNOSIS — Z79899 Other long term (current) drug therapy: Secondary | ICD-10-CM

## 2023-03-09 DIAGNOSIS — R197 Diarrhea, unspecified: Secondary | ICD-10-CM

## 2023-03-09 DIAGNOSIS — K824 Cholesterolosis of gallbladder: Secondary | ICD-10-CM

## 2023-03-09 MED ORDER — PANTOPRAZOLE SODIUM 40 MG PO TBEC: 40.0000 mg | DELAYED_RELEASE_TABLET | Freq: Every day | ORAL | 1 refills | Status: DC

## 2023-03-09 NOTE — Patient Instructions (Addendum)
YOU CAN CALL TO MAKE AN ULTRASOUND..  I have put in an order for a right upper quadrant ultrasound for you to have You can set them up at your convenience by calling this number [phone] You will likely have the ultrasound at [address]  If you have any issues call our office and we will set this up for you.     Increase Metoprolol to 50 mg 2 tabs twice a day Continue to check bp and keep a log Follow up in 1 month and bring BP log   Start Protonix in the am and Pepcid at night  Gallbladder Eating Plan High blood cholesterol, obesity, a sedentary lifestyle, an unhealthy diet, and diabetes are risk factors for developing gallstones. If you have a gallbladder condition, you may have trouble digesting fats and tolerating high fat intake. Eating a low-fat diet can help reduce your symptoms and may be helpful before and after having surgery to remove your gallbladder (cholecystectomy). Your health care provider may recommend that you work with a dietitian to help you reduce the amount of fat in your diet. What are tips for following this plan? General guidelines Limit your fat intake to less than 30% of your total daily calories. If you eat around 1,800 calories each day, this means eating less than 60 grams (g) of fat per day. Fat is an important part of a healthy diet. Eating a low-fat diet can make it hard to maintain a healthy body weight. Ask your dietitian how much fat, calories, and other nutrients you need each day. Eat small, frequent meals throughout the day instead of three large meals. Drink at least 8-10 cups (1.9-2.4 L) of fluid a day. Drink enough fluid to keep your urine pale yellow. If you drink alcohol: Limit how much you have to: 0-1 drink a day for women who are not pregnant. 0-2 drinks a day for men. Know how much alcohol is in a drink. In the [place] one drink equals one 12 oz bottle of beer (355 mL), one 5 oz glass of wine (148 mL), or one 1 oz glass of hard  liquor (44 mL). Reading food labels  Check nutrition facts on food labels for the amount of fat per serving. Choose foods with less than 3 grams of fat per serving. Shopping Choose nonfat and low-fat healthy foods. Look for the words "nonfat," "low-fat," or "fat-free." Avoid buying processed or prepackaged foods. Cooking Cook using low-fat methods, such as baking, broiling, grilling, or boiling. Cook with small amounts of healthy fats, such as olive oil, grapeseed oil, canola oil, avocado oil, or sunflower oil. What foods are recommended?  All fresh, frozen, or canned fruits and vegetables. Whole grains. Low-fat or nonfat (skim) milk and yogurt. Lean meat, skinless poultry, fish, eggs, and beans. Low-fat protein supplement powders or drinks. Spices and herbs. The items listed above may not be a complete list of foods and beverages you can eat and drink. Contact a dietitian for more information. What foods are not recommended? High-fat foods. These include baked goods, fast food, fatty cuts of meat, ice cream, french toast, sweet rolls, pizza, cheese bread, foods covered with butter, creamy sauces, or cheese. Fried foods. These include french fries, tempura, battered fish, breaded chicken, fried breads, and sweets. Foods that cause bloating and gas. The items listed above may not be a complete list of foods that you should avoid. Contact a dietitian for more information. Summary A low-fat diet can be  helpful if you have a gallbladder condition, or before and after gallbladder surgery. Limit your fat intake to less than 30% of your total daily calories. This is about 60 g of fat if you eat 1,800 calories each day. Eat small, frequent meals throughout the day instead of three large meals. This information is not intended to replace advice given to you by your health care provider. Make sure you discuss any questions you have with your health care provider. Document Revised: 06/12/2021  Document Reviewed: 06/12/2021 Elsevier Patient Education  2024 [employer].

## 2023-03-10 ENCOUNTER — Other Ambulatory Visit: Payer: Self-pay | Admitting: Nurse Practitioner

## 2023-03-10 ENCOUNTER — Encounter: Payer: Self-pay | Admitting: Nurse Practitioner

## 2023-03-10 ENCOUNTER — Ambulatory Visit
Admission: RE | Admit: 2023-03-10 | Discharge: 2023-03-10 | Disposition: A | Discharge status: Home / Self Care | Payer: BC Managed Care – PPO | Source: Ambulatory Visit | Attending: Nurse Practitioner | Admitting: Nurse Practitioner

## 2023-03-10 DIAGNOSIS — K824 Cholesterolosis of gallbladder: Secondary | ICD-10-CM

## 2023-03-10 DIAGNOSIS — K8020 Calculus of gallbladder without cholecystitis without obstruction: Secondary | ICD-10-CM

## 2023-03-10 DIAGNOSIS — R1011 Right upper quadrant pain: Secondary | ICD-10-CM

## 2023-03-15 LAB — COMPLETE METABOLIC PANEL WITH GFR
AG Ratio: 1.9 (calc) (ref 1.0–2.5)
ALT: 23 U/L (ref 6–29)
AST: 23 U/L (ref 10–35)
Albumin: 4.4 g/dL (ref 3.6–5.1)
Alkaline phosphatase (APISO): 64 U/L (ref 31–125)
BUN: 14 mg/dL (ref 7–25)
CO2: 30 mmol/L (ref 20–32)
Calcium: 9.8 mg/dL (ref 8.6–10.2)
Chloride: 105 mmol/L (ref 98–110)
Creat: 0.67 mg/dL (ref 0.50–0.99)
Globulin: 2.3 g/dL (ref 1.9–3.7)
Glucose, Bld: 57 mg/dL — ABNORMAL LOW (ref 65–99)
Potassium: 4.2 mmol/L (ref 3.5–5.3)
Sodium: 143 mmol/L (ref 135–146)
Total Bilirubin: 0.4 mg/dL (ref 0.2–1.2)
Total Protein: 6.7 g/dL (ref 6.1–8.1)
eGFR: 107 mL/min/{1.73_m2} (ref >=60)

## 2023-03-15 LAB — CBC WITH DIFFERENTIAL/PLATELET
Absolute Monocytes: 808 {cells}/uL (ref 200–950)
Basophils Absolute: 56 {cells}/uL (ref 0–200)
Basophils Relative: 0.6 %
Eosinophils Absolute: 150 {cells}/uL (ref 15–500)
Eosinophils Relative: 1.6 %
HCT: 40.8 % (ref 35.0–45.0)
Hemoglobin: 13.7 g/dL (ref 11.7–15.5)
Lymphs Abs: 2698 {cells}/uL (ref 850–3900)
MCH: 33.7 pg — ABNORMAL HIGH (ref 27.0–33.0)
MCHC: 33.6 g/dL (ref 32.0–36.0)
MCV: 100.5 fL — ABNORMAL HIGH (ref 80.0–100.0)
MPV: 13.9 fL — ABNORMAL HIGH (ref 7.5–12.5)
Monocytes Relative: 8.6 %
Neutro Abs: 5687 {cells}/uL (ref 1500–7800)
Neutrophils Relative %: 60.5 %
Platelets: 168 10*3/uL (ref 140–400)
RBC: 4.06 10*6/uL (ref 3.80–5.10)
RDW: 12.8 % (ref 11.0–15.0)
Total Lymphocyte: 28.7 %
WBC: 9.4 10*3/uL (ref 3.8–10.8)

## 2023-03-15 LAB — H. PYLORI BREATH TEST: H. pylori Breath Test: NOT DETECTED

## 2023-03-15 LAB — LIPID PANEL
Cholesterol: 188 mg/dL (ref <200)
HDL: 53 mg/dL (ref >=50)
LDL Cholesterol (Calc): 109 mg/dL — ABNORMAL HIGH
Non-HDL Cholesterol (Calc): 135 mg/dL — ABNORMAL HIGH (ref <130)
Total CHOL/HDL Ratio: 3.5 (calc) (ref <5.0)
Triglycerides: 148 mg/dL (ref <150)

## 2023-03-15 LAB — MAGNESIUM: Magnesium: 2.2 mg/dL (ref 1.5–2.5)

## 2023-04-07 NOTE — Progress Notes (Signed)
3 MONTH FOLLOW UP   Assessment and Plan:   Andrea Owens was seen today for 3 month followup  Diagnoses and all orders for this visit:  Essential hypertension Continue current medications:  olmesartan 40 mg every day and HCTZ 25mg  daily in am,  Metoprolol to 100 mg  BID and add minoxidil 10 mg 1/2 tab once a day Keep BP log and follow up in 2 weeks Monitor blood pressure at home; call if consistently over 130/80 Continue DASH diet.   Reminder to go to the ER if any CP, SOB, nausea, dizziness, severe HA, changes vision/speech, left arm numbness and tingling and jaw pain. -     CBC with Differential/Platelet -     COMPLETE METABOLIC PANEL WITH GFR     Tobacco use disorder She is not smoking more than half a pack, continues to try to quit.  Discussed smoking cessation Not ready to quit at this time         Continue diet and meds as discussed. Further disposition pending results of labs. Over 30 minutes of face to face interview, exam, counseling, chart review, and critical decision making was performed  Future Appointments  Date Time Provider Department Center  10/14/2023  9:00 AM Raynelle Dick, NP [facility] None     HPI 47 y.o. female  presents for 1 month follow up on HTN, Smoking Overall she is doing well.    Her father is sick and she is helping to take care of him. He has brain cancer. He lives 5 minutes away.    Her blood pressure has not been controlled at home running 146-168/88-103, she is on Olmesartan 40 mg every day, HCTZ 25mg  daily and metoprolol 100 mg tabs BID, today their BP is BP: (!) 148/90   BP Readings from Last 3 Encounters:  04/11/23 (!) 148/90  03/09/23 (!) 158/80  11/18/22 (!) 166/96    BMI is Body mass index is 27.69 kg/m., she is working on diet and exercise. Wt Readings from Last 3 Encounters:  04/11/23 162 lb 9.6 oz (73.8 kg)  03/09/23 157 lb 6.4 oz (71.4 kg)  11/18/22 156 lb 9.6 oz (71 kg)  She does workout, she is walking. She denies  chest pain, shortness of breath, dizziness.   She continues to smoke, about a 1/2 a pack a day. She has tried chantix and did not like the feeling.  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 She is on the 300mg  every day   Current Medications:    Current Outpatient Medications (Endocrine & Metabolic):    estradiol (DOTTI) 0.1 MG/24HR patch, Apply 1 patch twice a week by transdermal route as directed.  Current Outpatient Medications (Cardiovascular):    hydrochlorothiazide (HYDRODIURIL) 25 MG tablet, TAKE 1 TABLET BY MOUTH DAILY FOR BLOOD PRESSURE AND FLUID  RETENTION, ANKLE SWELLING   metoprolol tartrate (LOPRESSOR) 50 MG tablet, TAKE 1 TABLET BY MOUTH EVERY 12  HOURS FOR BLOOD PRESSURE   olmesartan (BENICAR) 40 MG tablet, Take 1 tablet (40 mg total) by mouth daily.   rosuvastatin (CRESTOR) 10 MG tablet, Take 1 tablet (10 mg total) by mouth at bedtime.  Current Outpatient Medications (Respiratory):    fluticasone (FLONASE) 50 MCG/ACT nasal spray, Place 2 sprays into both nostrils at bedtime.   montelukast (SINGULAIR) 10 MG tablet, TAKE 1 TABLET BY MOUTH DAILY  Current Outpatient Medications (Analgesics):    acetaminophen (TYLENOL) 500 MG tablet, Take 1,000 mg  by mouth every 6 (six) hours as needed.  Current Outpatient Medications (Hematological):    Cyanocobalamin (VITAMIN B 12 PO), Take by mouth daily.  Current Outpatient Medications (Other):    buPROPion (WELLBUTRIN XL) 300 MG 24 hr tablet, TAKE 1 TABLET BY MOUTH  EVERY MORNING FOR MOOD,  FOCUS AND CONCENTRATION   cholecalciferol (VITAMIN D) 1000 UNITS tablet, Take 2,000 Units by mouth daily.   cyclobenzaprine (FLEXERIL) 5 MG tablet, Take 1 tablet (5 mg total) by mouth 3 (three) times daily as needed for muscle spasms.   estradiol (ESTRACE) 0.1 MG/GM vaginal cream, INSERT 1 GRAM VAGINALLY 2 TIMES A WEEK AT BEDTIME   famotidine (PEPCID) 40 MG tablet, Take 40 mg by mouth daily.   Magnesium  250 MG TABS, Take 250 mg by mouth daily.   pantoprazole (PROTONIX) 40 MG tablet, TAKE 1 TABLET(40 MG) BY MOUTH DAILY   tretinoin (RETIN-A) 0.025 % cream, Apply topically.   TURMERIC PO, Take 500 mg by mouth.  Medical History:  Past Medical History:  Diagnosis Date   Allergy    Endometriosis    GERD (gastroesophageal reflux disease)    Hyperlipidemia    Hypertension    Migraine    Prediabetes    Vitamin D deficiency    Allergies:  Allergies  Allergen Reactions   Penicillins     rash      Patient Care Team: Lucky Cowboy, MD as PCP - General (Internal Medicine) Pyrtle, Carie Caddy, MD as Consulting Physician (Gastroenterology) Candice Camp, MD as Consulting Physician (Obstetrics and Gynecology)   Family history- Review and unchanged Social history- Review and unchanged  Review of Systems: all normal unless noted above  Physical Exam: BP (!) 148/90   Pulse 70   Temp (!) 97.5 F (36.4 C)   Ht 5' 4.25" (1.632 m)   Wt 162 lb 9.6 oz (73.8 kg)   LMP 03/15/2015   SpO2 98%   BMI 27.69 kg/m  Wt Readings from Last 3 Encounters:  04/11/23 162 lb 9.6 oz (73.8 kg)  03/09/23 157 lb 6.4 oz (71.4 kg)  11/18/22 156 lb 9.6 oz (71 kg)   General Appearance: Well nourished, in no apparent distress. Eyes: PERRLA, EOMs, conjunctiva no swelling or erythema Neck: Supple, thyroid with small nodules on the left.  Respiratory: Respiratory effort normal, BS equal bilaterally without rales, rhonchi Expiratory wheezing top lobes bilaterally that clears with cough. Cardio: RRR with no MRGs. Brisk peripheral pulses without edema.  Abdomen: Soft, + BS.Epigastric and RUQ tenderness, no masses Lymphatics: Non tender without lymphadenopathy.  Musculoskeletal:  No warmth, erythema. 5/5 strength. Muscle spasm noted in thoracic area of back on right side Skin: dry without rashes, lesions, ecchymosis.  Bilateral feet, sensation intact.   Neuro: Cranial nerves intact. Normal muscle tone, no cerebellar  symptoms. Psych: Awake and oriented X 3, normal affect, Insight and Judgment appropriate.  EKG: NSR, no ST changes    [facility]  04/11/2023

## 2023-04-11 ENCOUNTER — Ambulatory Visit: Payer: BC Managed Care – PPO | Admitting: Nurse Practitioner

## 2023-04-11 ENCOUNTER — Encounter: Payer: Self-pay | Admitting: Nurse Practitioner

## 2023-04-11 VITALS — BP 148/90 | HR 70 | Temp 97.5°F | Ht 64.25 in | Wt 162.6 lb

## 2023-04-11 DIAGNOSIS — R0602 Shortness of breath: Secondary | ICD-10-CM

## 2023-04-11 DIAGNOSIS — I700 Atherosclerosis of aorta: Secondary | ICD-10-CM | POA: Diagnosis not present

## 2023-04-11 DIAGNOSIS — E663 Overweight: Secondary | ICD-10-CM

## 2023-04-11 DIAGNOSIS — I10 Essential (primary) hypertension: Secondary | ICD-10-CM

## 2023-04-11 MED ORDER — METOPROLOL TARTRATE 100 MG PO TABS: 100.0000 mg | ORAL_TABLET | Freq: Two times a day (BID) | ORAL | 2 refills | Status: DC

## 2023-04-11 MED ORDER — MINOXIDIL 10 MG PO TABS: ORAL_TABLET | ORAL | 0 refills | Status: DC

## 2023-04-11 NOTE — Patient Instructions (Signed)
Hydrochlorothiazide 25 mg every AM Add Minoxidil 10 mg 1/2 tab everyAM Metoprolol 100 mg  every AM  Olmesartan 40 mg 1 tab daily every PM Metoprolol 100 mg every PM  Follow up in 2 weeks, check bp daily  Go to the ER if any chest pain, shortness of breath, nausea, dizziness, severe HA, changes vision/speech    Hypertension, Adult High blood pressure (hypertension) is when the force of blood pumping through the arteries is too strong. The arteries are the blood vessels that carry blood from the heart throughout the body. Hypertension forces the heart to work harder to pump blood and may cause arteries to become narrow or stiff. Untreated or uncontrolled hypertension can lead to a heart attack, heart failure, a stroke, kidney disease, and other problems. A blood pressure reading consists of a higher number over a lower number. Ideally, your blood pressure should be below 120/80. The first ("top") number is called the systolic pressure. It is a measure of the pressure in your arteries as your heart beats. The second ("bottom") number is called the diastolic pressure. It is a measure of the pressure in your arteries as the heart relaxes. What are the causes? The exact cause of this condition is not known. There are some conditions that result in high blood pressure. What increases the risk? Certain factors may make you more likely to develop high blood pressure. Some of these risk factors are under your control, including: Smoking. Not getting enough exercise or physical activity. Being overweight. Having too much fat, sugar, calories, or salt (sodium) in your diet. Drinking too much alcohol. Other risk factors include: Having a personal history of heart disease, diabetes, high cholesterol, or kidney disease. Stress. Having a family history of high blood pressure and high cholesterol. Having obstructive sleep apnea. Age. The risk increases with age. What are the signs or symptoms? High  blood pressure may not cause symptoms. Very high blood pressure (hypertensive crisis) may cause: Headache. Fast or irregular heartbeats (palpitations). Shortness of breath. Nosebleed. Nausea and vomiting. Vision changes. Severe chest pain, dizziness, and seizures. How is this diagnosed? This condition is diagnosed by measuring your blood pressure while you are seated, with your arm resting on a flat surface, your legs uncrossed, and your feet flat on the floor. The cuff of the blood pressure monitor will be placed directly against the skin of your upper arm at the level of your heart. Blood pressure should be measured at least twice using the same arm. Certain conditions can cause a difference in blood pressure between your right and left arms. If you have a high blood pressure reading during one visit or you have normal blood pressure with other risk factors, you may be asked to: Return on a different day to have your blood pressure checked again. Monitor your blood pressure at home for 1 week or longer. If you are diagnosed with hypertension, you may have other blood or imaging tests to help your health care provider understand your overall risk for other conditions. How is this treated? This condition is treated by making healthy lifestyle changes, such as eating healthy foods, exercising more, and reducing your alcohol intake. You may be referred for counseling on a healthy diet and physical activity. Your health care provider may prescribe medicine if lifestyle changes are not enough to get your blood pressure under control and if: Your systolic blood pressure is above 130. Your diastolic blood pressure is above 80. Your personal target blood pressure may  vary depending on your medical conditions, your age, and other factors. Follow these instructions at home: Eating and drinking  Eat a diet that is high in fiber and potassium, and low in sodium, added sugar, and fat. An example of this  eating plan is called the DASH diet. DASH stands for Dietary Approaches to Stop Hypertension. To eat this way: Eat plenty of fresh fruits and vegetables. Try to fill one half of your plate at each meal with fruits and vegetables. Eat whole grains, such as whole-wheat pasta, brown rice, or whole-grain bread. Fill about one fourth of your plate with whole grains. Eat or drink low-fat dairy products, such as skim milk or low-fat yogurt. Avoid fatty cuts of meat, processed or cured meats, and poultry with skin. Fill about one fourth of your plate with lean proteins, such as fish, chicken without skin, beans, eggs, or tofu. Avoid pre-made and processed foods. These tend to be higher in sodium, added sugar, and fat. Reduce your daily sodium intake. Many people with hypertension should eat less than 1,500 mg of sodium a day. Do not drink alcohol if: Your health care provider tells you not to drink. You are pregnant, may be pregnant, or are planning to become pregnant. If you drink alcohol: Limit how much you have to: 0-1 drink a day for women. 0-2 drinks a day for men. Know how much alcohol is in your drink. In the [place] one drink equals one 12 oz bottle of beer (355 mL), one 5 oz glass of wine (148 mL), or one 1 oz glass of hard liquor (44 mL). Lifestyle  Work with your health care provider to maintain a healthy body weight or to lose weight. Ask what an ideal weight is for you. Get at least 30 minutes of exercise that causes your heart to beat faster (aerobic exercise) most days of the week. Activities may include walking, swimming, or biking. Include exercise to strengthen your muscles (resistance exercise), such as Pilates or lifting weights, as part of your weekly exercise routine. Try to do these types of exercises for 30 minutes at least 3 days a week. Do not use any products that contain nicotine or tobacco. These products include cigarettes, chewing tobacco, and vaping devices, such as  e-cigarettes. If you need help quitting, ask your health care provider. Monitor your blood pressure at home as told by your health care provider. Keep all follow-up visits. This is important. Medicines Take over-the-counter and prescription medicines only as told by your health care provider. Follow directions carefully. Blood pressure medicines must be taken as prescribed. Do not skip doses of blood pressure medicine. Doing this puts you at risk for problems and can make the medicine less effective. Ask your health care provider about side effects or reactions to medicines that you should watch for. Contact a health care provider if you: Think you are having a reaction to a medicine you are taking. Have headaches that keep coming back (recurring). Feel dizzy. Have swelling in your ankles. Have trouble with your vision. Get help right away if you: Develop a severe headache or confusion. Have unusual weakness or numbness. Feel faint. Have severe pain in your chest or abdomen. Vomit repeatedly. Have trouble breathing. These symptoms may be an emergency. Get help right away. Call 911. Do not wait to see if the symptoms will go away. Do not drive yourself to the hospital. Summary Hypertension is when the force of blood pumping through your arteries is too strong. If  this condition is not controlled, it may put you at risk for serious complications. Your personal target blood pressure may vary depending on your medical conditions, your age, and other factors. For most people, a normal blood pressure is less than 120/80. Hypertension is treated with lifestyle changes, medicines, or a combination of both. Lifestyle changes include losing weight, eating a healthy, low-sodium diet, exercising more, and limiting alcohol. This information is not intended to replace advice given to you by your health care provider. Make sure you discuss any questions you have with your health care provider. Document  Revised: 05/05/2021 Document Reviewed: 05/05/2021 Elsevier Patient Education  2024 [employer].

## 2023-04-12 ENCOUNTER — Other Ambulatory Visit: Payer: Self-pay | Admitting: Nurse Practitioner

## 2023-04-12 DIAGNOSIS — I10 Essential (primary) hypertension: Secondary | ICD-10-CM

## 2023-04-26 ENCOUNTER — Telehealth: Payer: Self-pay

## 2023-04-26 NOTE — Progress Notes (Deleted)
Assessment and Plan:  There are no diagnoses linked to this encounter.    Further disposition pending results of labs. Discussed med's effects and SE's.   Over 30 minutes of exam, counseling, chart review, and critical decision making was performed.   Future Appointments  Date Time Provider Department Center  04/27/2023 11:45 AM Raynelle Dick, NP [facility] None  10/14/2023  9:00 AM Raynelle Dick, NP [facility] None    ------------------------------------------------------------------------------------------------------------------   HPI LMP 03/15/2015  47 y.o.female presents for  Past Medical History:  Diagnosis Date   Allergy    Endometriosis    GERD (gastroesophageal reflux disease)    Hyperlipidemia    Hypertension    Migraine    Prediabetes    Vitamin D deficiency      Allergies  Allergen Reactions   Penicillins     rash    Current Outpatient Medications on File Prior to Visit  Medication Sig   acetaminophen (TYLENOL) 500 MG tablet Take 1,000 mg by mouth every 6 (six) hours as needed.   buPROPion (WELLBUTRIN XL) 300 MG 24 hr tablet TAKE 1 TABLET BY MOUTH  EVERY MORNING FOR MOOD,  FOCUS AND CONCENTRATION   cholecalciferol (VITAMIN D) 1000 UNITS tablet Take 2,000 Units by mouth daily.   Cyanocobalamin (VITAMIN B 12 PO) Take by mouth daily.   cyclobenzaprine (FLEXERIL) 5 MG tablet Take 1 tablet (5 mg total) by mouth 3 (three) times daily as needed for muscle spasms.   estradiol (DOTTI) 0.1 MG/24HR patch Apply 1 patch twice a week by transdermal route as directed.   estradiol (ESTRACE) 0.1 MG/GM vaginal cream INSERT 1 GRAM VAGINALLY 2 TIMES A WEEK AT BEDTIME   famotidine (PEPCID) 40 MG tablet Take 40 mg by mouth daily.   fluticasone (FLONASE) 50 MCG/ACT nasal spray Place 2 sprays into both nostrils at bedtime.   hydrochlorothiazide (HYDRODIURIL) 25 MG tablet TAKE 1 TABLET BY MOUTH DAILY FOR BLOOD PRESSURE AND FLUID  RETENTION, ANKLE SWELLING   Magnesium 250  MG TABS Take 250 mg by mouth daily.   metoprolol tartrate (LOPRESSOR) 100 MG tablet Take 1 tablet (100 mg total) by mouth 2 (two) times daily.   minoxidil (LONITEN) 10 MG tablet TAKE 1/2 TABLET BY MOUTH DAILY IN THE MORNING FOR BLOOD PRESSURE   montelukast (SINGULAIR) 10 MG tablet TAKE 1 TABLET BY MOUTH DAILY   olmesartan (BENICAR) 40 MG tablet Take 1 tablet (40 mg total) by mouth daily.   pantoprazole (PROTONIX) 40 MG tablet TAKE 1 TABLET(40 MG) BY MOUTH DAILY   rosuvastatin (CRESTOR) 10 MG tablet Take 1 tablet (10 mg total) by mouth at bedtime.   tretinoin (RETIN-A) 0.025 % cream Apply topically.   TURMERIC PO Take 500 mg by mouth.   No current facility-administered medications on file prior to visit.    ROS: all negative except above.   Physical Exam:  LMP 03/15/2015   General Appearance: Well nourished, in no apparent distress. Eyes: PERRLA, EOMs, conjunctiva no swelling or erythema Sinuses: No Frontal/maxillary tenderness ENT/Mouth: Ext aud canals clear, TMs without erythema, bulging. No erythema, swelling, or exudate on post pharynx.  Tonsils not swollen or erythematous. Hearing normal.  Neck: Supple, thyroid normal.  Respiratory: Respiratory effort normal, BS equal bilaterally without rales, rhonchi, wheezing or stridor.  Cardio: RRR with no MRGs. Brisk peripheral pulses without edema.  Abdomen: Soft, + BS.  Non tender, no guarding, rebound, hernias, masses. Lymphatics: Non tender without lymphadenopathy.  Musculoskeletal: Full ROM, 5/5 strength, normal gait.  Skin:  Warm, dry without rashes, lesions, ecchymosis.  Neuro: Cranial nerves intact. Normal muscle tone, no cerebellar symptoms. Sensation intact.  Psych: Awake and oriented X 3, normal affect, Insight and Judgment appropriate.     Raynelle Dick, NP 12:22 PM [facility]

## 2023-04-26 NOTE — Telephone Encounter (Signed)
Prior auth completed and submitted. 

## 2023-04-27 ENCOUNTER — Ambulatory Visit: Payer: BC Managed Care – PPO | Admitting: Nurse Practitioner

## 2023-04-27 DIAGNOSIS — K8020 Calculus of gallbladder without cholecystitis without obstruction: Secondary | ICD-10-CM | POA: Diagnosis not present

## 2023-04-28 NOTE — Progress Notes (Signed)
Assessment and Plan:  Diagnoses and all orders for this visit:  Essential hypertension - continue medications-Olmesartan 40 mg every day, HCTZ 25mg  daily and metoprolol 100 mg tabs BID , Minoxidil 10 mg 1/2 tab every day.  BP is well controlled with this combination of medications so will do appeal - Continue DASH diet, exercise and monitor at home. Call if greater than 130/80.   Aortic atherosclerosis (HCC) by Abd CT scan on 03.01.2023 Control BP, Weight, Cholesterol and blood sugars      Further disposition pending results of labs. Discussed med's effects and SE's.   Over 30 minutes of exam, counseling, chart review, and critical decision making was performed.   Future Appointments  Date Time Provider Department Center  04/29/2023 11:30 AM Raynelle Dick, NP [facility] None  10/14/2023  9:00 AM Raynelle Dick, NP [facility] None    ------------------------------------------------------------------------------------------------------------------   HPI BP 128/82   Pulse 67   Ht 5' 4.25" (1.632 m)   Wt 163 lb 3.2 oz (74 kg)   LMP 03/15/2015   SpO2 98%   BMI 27.80 kg/m  47 y.o.female presents for reevaluation of BP  Her blood pressure has not been controlled at home running 120-150/80's, she is on Olmesartan 40 mg every day, HCTZ 25mg  daily and metoprolol 100 mg tabs BID. She was to start Minoxidil but insurance would not cover the medication. She is on minoxidil 10 mg 1/2 tab daily but insurance will not cover. She has 15 pills.  BP is well controlled with this combination of medications so will do appeal BP Readings from Last 3 Encounters:  04/29/23 128/82  04/11/23 (!) 148/90  03/09/23 (!) 158/80   She is due to have her cholescystectomy 06/13/23.    BMI is Body mass index is 27.8 kg/m., she has not been working on diet and exercise. Wt Readings from Last 3 Encounters:  04/29/23 163 lb 3.2 oz (74 kg)  04/11/23 162 lb 9.6 oz (73.8 kg)  03/09/23 157 lb 6.4 oz (71.4  kg)    Past Medical History:  Diagnosis Date   Allergy    Endometriosis    GERD (gastroesophageal reflux disease)    Hyperlipidemia    Hypertension    Migraine    Prediabetes    Vitamin D deficiency      Allergies  Allergen Reactions   Penicillins     rash    Current Outpatient Medications on File Prior to Visit  Medication Sig   acetaminophen (TYLENOL) 500 MG tablet Take 1,000 mg by mouth every 6 (six) hours as needed.   buPROPion (WELLBUTRIN XL) 300 MG 24 hr tablet TAKE 1 TABLET BY MOUTH  EVERY MORNING FOR MOOD,  FOCUS AND CONCENTRATION   cholecalciferol (VITAMIN D) 1000 UNITS tablet Take 2,000 Units by mouth daily.   Cyanocobalamin (VITAMIN B 12 PO) Take by mouth daily.   cyclobenzaprine (FLEXERIL) 5 MG tablet Take 1 tablet (5 mg total) by mouth 3 (three) times daily as needed for muscle spasms.   estradiol (DOTTI) 0.1 MG/24HR patch Apply 1 patch twice a week by transdermal route as directed.   estradiol (ESTRACE) 0.1 MG/GM vaginal cream INSERT 1 GRAM VAGINALLY 2 TIMES A WEEK AT BEDTIME   famotidine (PEPCID) 40 MG tablet Take 40 mg by mouth daily.   fluticasone (FLONASE) 50 MCG/ACT nasal spray Place 2 sprays into both nostrils at bedtime.   hydrochlorothiazide (HYDRODIURIL) 25 MG tablet TAKE 1 TABLET BY MOUTH DAILY FOR BLOOD PRESSURE AND FLUID  RETENTION,  ANKLE SWELLING   Magnesium 250 MG TABS Take 250 mg by mouth daily.   metoprolol tartrate (LOPRESSOR) 100 MG tablet Take 1 tablet (100 mg total) by mouth 2 (two) times daily.   minoxidil (LONITEN) 10 MG tablet TAKE 1/2 TABLET BY MOUTH DAILY IN THE MORNING FOR BLOOD PRESSURE   montelukast (SINGULAIR) 10 MG tablet TAKE 1 TABLET BY MOUTH DAILY   olmesartan (BENICAR) 40 MG tablet Take 1 tablet (40 mg total) by mouth daily.   pantoprazole (PROTONIX) 40 MG tablet TAKE 1 TABLET(40 MG) BY MOUTH DAILY   rosuvastatin (CRESTOR) 10 MG tablet Take 1 tablet (10 mg total) by mouth at bedtime.   tretinoin (RETIN-A) 0.025 % cream Apply  topically.   TURMERIC PO Take 500 mg by mouth.   No current facility-administered medications on file prior to visit.    ROS: all negative except above.   Physical Exam:  BP 128/82   Pulse 67   Ht 5' 4.25" (1.632 m)   Wt 163 lb 3.2 oz (74 kg)   LMP 03/15/2015   SpO2 98%   BMI 27.80 kg/m   General Appearance: Well nourished, in no apparent distress. Eyes: PERRLA, EOMs, conjunctiva no swelling or erythema Neck: Supple, thyroid normal.  Respiratory: Respiratory effort normal, BS equal bilaterally without rales, rhonchi, wheezing or stridor.  Cardio: RRR with no MRGs. Brisk peripheral pulses without edema.  Abdomen: Soft, + BS.  Tenderness RUQ Lymphatics: Non tender without lymphadenopathy.  Musculoskeletal: Full ROM, 5/5 strength, normal gait.  Skin: Warm, dry without rashes, lesions, ecchymosis.  Neuro: Cranial nerves intact. Normal muscle tone, no cerebellar symptoms. Sensation intact.  Psych: Awake and oriented X 3, normal affect, Insight and Judgment appropriate.     Raynelle Dick, NP 11:28 AM [name]

## 2023-04-29 ENCOUNTER — Ambulatory Visit: Payer: BC Managed Care – PPO | Admitting: Nurse Practitioner

## 2023-04-29 ENCOUNTER — Encounter: Payer: Self-pay | Admitting: Nurse Practitioner

## 2023-04-29 VITALS — BP 128/82 | HR 67 | Ht 64.25 in | Wt 163.2 lb

## 2023-04-29 DIAGNOSIS — I700 Atherosclerosis of aorta: Secondary | ICD-10-CM | POA: Diagnosis not present

## 2023-04-29 DIAGNOSIS — I10 Essential (primary) hypertension: Secondary | ICD-10-CM

## 2023-04-29 NOTE — Patient Instructions (Signed)
Continue current meds and will do appeal for minoxidil   Hypertension, Adult High blood pressure (hypertension) is when the force of blood pumping through the arteries is too strong. The arteries are the blood vessels that carry blood from the heart throughout the body. Hypertension forces the heart to work harder to pump blood and may cause arteries to become narrow or stiff. Untreated or uncontrolled hypertension can lead to a heart attack, heart failure, a stroke, kidney disease, and other problems. A blood pressure reading consists of a higher number over a lower number. Ideally, your blood pressure should be below 120/80. The first ("top") number is called the systolic pressure. It is a measure of the pressure in your arteries as your heart beats. The second ("bottom") number is called the diastolic pressure. It is a measure of the pressure in your arteries as the heart relaxes. What are the causes? The exact cause of this condition is not known. There are some conditions that result in high blood pressure. What increases the risk? Certain factors may make you more likely to develop high blood pressure. Some of these risk factors are under your control, including: Smoking. Not getting enough exercise or physical activity. Being overweight. Having too much fat, sugar, calories, or salt (sodium) in your diet. Drinking too much alcohol. Other risk factors include: Having a personal history of heart disease, diabetes, high cholesterol, or kidney disease. Stress. Having a family history of high blood pressure and high cholesterol. Having obstructive sleep apnea. Age. The risk increases with age. What are the signs or symptoms? High blood pressure may not cause symptoms. Very high blood pressure (hypertensive crisis) may cause: Headache. Fast or irregular heartbeats (palpitations). Shortness of breath. Nosebleed. Nausea and vomiting. Vision changes. Severe chest pain, dizziness, and  seizures. How is this diagnosed? This condition is diagnosed by measuring your blood pressure while you are seated, with your arm resting on a flat surface, your legs uncrossed, and your feet flat on the floor. The cuff of the blood pressure monitor will be placed directly against the skin of your upper arm at the level of your heart. Blood pressure should be measured at least twice using the same arm. Certain conditions can cause a difference in blood pressure between your right and left arms. If you have a high blood pressure reading during one visit or you have normal blood pressure with other risk factors, you may be asked to: Return on a different day to have your blood pressure checked again. Monitor your blood pressure at home for 1 week or longer. If you are diagnosed with hypertension, you may have other blood or imaging tests to help your health care provider understand your overall risk for other conditions. How is this treated? This condition is treated by making healthy lifestyle changes, such as eating healthy foods, exercising more, and reducing your alcohol intake. You may be referred for counseling on a healthy diet and physical activity. Your health care provider may prescribe medicine if lifestyle changes are not enough to get your blood pressure under control and if: Your systolic blood pressure is above 130. Your diastolic blood pressure is above 80. Your personal target blood pressure may vary depending on your medical conditions, your age, and other factors. Follow these instructions at home: Eating and drinking  Eat a diet that is high in fiber and potassium, and low in sodium, added sugar, and fat. An example of this eating plan is called the DASH diet. DASH stands  for Dietary Approaches to Stop Hypertension. To eat this way: Eat plenty of fresh fruits and vegetables. Try to fill one half of your plate at each meal with fruits and vegetables. Eat whole grains, such as  whole-wheat pasta, brown rice, or whole-grain bread. Fill about one fourth of your plate with whole grains. Eat or drink low-fat dairy products, such as skim milk or low-fat yogurt. Avoid fatty cuts of meat, processed or cured meats, and poultry with skin. Fill about one fourth of your plate with lean proteins, such as fish, chicken without skin, beans, eggs, or tofu. Avoid pre-made and processed foods. These tend to be higher in sodium, added sugar, and fat. Reduce your daily sodium intake. Many people with hypertension should eat less than 1,500 mg of sodium a day. Do not drink alcohol if: Your health care provider tells you not to drink. You are pregnant, may be pregnant, or are planning to become pregnant. If you drink alcohol: Limit how much you have to: 0-1 drink a day for women. 0-2 drinks a day for men. Know how much alcohol is in your drink. In the [place] one drink equals one 12 oz bottle of beer (355 mL), one 5 oz glass of wine (148 mL), or one 1 oz glass of hard liquor (44 mL). Lifestyle  Work with your health care provider to maintain a healthy body weight or to lose weight. Ask what an ideal weight is for you. Get at least 30 minutes of exercise that causes your heart to beat faster (aerobic exercise) most days of the week. Activities may include walking, swimming, or biking. Include exercise to strengthen your muscles (resistance exercise), such as Pilates or lifting weights, as part of your weekly exercise routine. Try to do these types of exercises for 30 minutes at least 3 days a week. Do not use any products that contain nicotine or tobacco. These products include cigarettes, chewing tobacco, and vaping devices, such as e-cigarettes. If you need help quitting, ask your health care provider. Monitor your blood pressure at home as told by your health care provider. Keep all follow-up visits. This is important. Medicines Take over-the-counter and prescription medicines only as  told by your health care provider. Follow directions carefully. Blood pressure medicines must be taken as prescribed. Do not skip doses of blood pressure medicine. Doing this puts you at risk for problems and can make the medicine less effective. Ask your health care provider about side effects or reactions to medicines that you should watch for. Contact a health care provider if you: Think you are having a reaction to a medicine you are taking. Have headaches that keep coming back (recurring). Feel dizzy. Have swelling in your ankles. Have trouble with your vision. Get help right away if you: Develop a severe headache or confusion. Have unusual weakness or numbness. Feel faint. Have severe pain in your chest or abdomen. Vomit repeatedly. Have trouble breathing. These symptoms may be an emergency. Get help right away. Call 911. Do not wait to see if the symptoms will go away. Do not drive yourself to the hospital. Summary Hypertension is when the force of blood pumping through your arteries is too strong. If this condition is not controlled, it may put you at risk for serious complications. Your personal target blood pressure may vary depending on your medical conditions, your age, and other factors. For most people, a normal blood pressure is less than 120/80. Hypertension is treated with lifestyle changes, medicines, or a  combination of both. Lifestyle changes include losing weight, eating a healthy, low-sodium diet, exercising more, and limiting alcohol. This information is not intended to replace advice given to you by your health care provider. Make sure you discuss any questions you have with your health care provider. Document Revised: 05/05/2021 Document Reviewed: 05/05/2021 Elsevier Patient Education  2024 [employer].

## 2023-05-04 ENCOUNTER — Encounter: Payer: Self-pay | Admitting: Nurse Practitioner

## 2023-05-04 ENCOUNTER — Other Ambulatory Visit: Payer: Self-pay

## 2023-05-04 DIAGNOSIS — I10 Essential (primary) hypertension: Secondary | ICD-10-CM

## 2023-05-04 MED ORDER — MINOXIDIL 10 MG PO TABS: ORAL_TABLET | ORAL | 0 refills | Status: DC

## 2023-05-09 ENCOUNTER — Other Ambulatory Visit: Payer: Self-pay

## 2023-05-09 DIAGNOSIS — I10 Essential (primary) hypertension: Secondary | ICD-10-CM

## 2023-05-09 MED ORDER — MINOXIDIL 10 MG PO TABS: ORAL_TABLET | ORAL | 3 refills | Status: DC

## 2023-05-16 ENCOUNTER — Other Ambulatory Visit: Payer: Self-pay

## 2023-05-16 DIAGNOSIS — I10 Essential (primary) hypertension: Secondary | ICD-10-CM

## 2023-05-16 MED ORDER — OLMESARTAN MEDOXOMIL 40 MG PO TABS: 40.0000 mg | ORAL_TABLET | Freq: Every day | ORAL | 11 refills | Status: DC

## 2023-05-24 NOTE — Progress Notes (Signed)
Sent message, via epic in basket, requesting orders in epic from surgeon.  

## 2023-05-25 ENCOUNTER — Ambulatory Visit: Payer: Self-pay | Admitting: General Surgery

## 2023-05-25 DIAGNOSIS — Z01419 Encounter for gynecological examination (general) (routine) without abnormal findings: Secondary | ICD-10-CM | POA: Diagnosis not present

## 2023-05-25 DIAGNOSIS — Z1231 Encounter for screening mammogram for malignant neoplasm of breast: Secondary | ICD-10-CM | POA: Diagnosis not present

## 2023-05-25 DIAGNOSIS — K8020 Calculus of gallbladder without cholecystitis without obstruction: Secondary | ICD-10-CM

## 2023-05-25 DIAGNOSIS — Z6827 Body mass index (BMI) 27.0-27.9, adult: Secondary | ICD-10-CM | POA: Diagnosis not present

## 2023-05-26 NOTE — Progress Notes (Addendum)
Anesthesia Review:  PCP: DR Lucky Cowboy sees Jorge Mandril  LOV 04/29/23  Cardiologist : none  Chest x-ray : EKG :04/11/23  and 06/10/23  Echo : Stress test: Cardiac Cath :  Activity level: can do a flight of stairs without difficulty  Sleep Study/ CPAP : none  Fasting Blood Sugar :      / Checks Blood Sugar -- times a day:   Blood Thinner/ Instructions /Last Dose: ASA / Instructions/ Last Dose :

## 2023-05-26 NOTE — Patient Instructions (Signed)
SURGICAL WAITING ROOM VISITATION  Patients having surgery or a procedure may have no more than 2 support people in the waiting area - these visitors may rotate.    Children under the age of 65 must have an adult with them who is not the patient.  Due to an increase in RSV and influenza rates and associated hospitalizations, children ages 83 and under may not visit patients in [facility] hospitals.  If the patient needs to stay at the hospital during part of their recovery, the visitor guidelines for inpatient rooms apply. Pre-op nurse will coordinate an appropriate time for 1 support person to accompany patient in pre-op.  This support person may not rotate.    Please refer to the [facility] website for the visitor guidelines for Inpatients (after your surgery is over and you are in a regular room).       Your procedure is scheduled on:  06/13/2023    Report to [facility] Main Entrance    Report to admitting at  1200 noon    Call this number if you have problems the morning of surgery [phone]   Do not eat food :After Midnight.   After Midnight you may have the following liquids until  1100______ AM DAY OF SURGERY  Water Non-Citrus Juices (without pulp, NO RED-Apple, White grape, White cranberry) Black Coffee (NO MILK/CREAM OR CREAMERS, sugar ok)  Clear Tea (NO MILK/CREAM OR CREAMERS, sugar ok) regular and decaf                             Plain Jell-O (NO RED)                                           Fruit ices (not with fruit pulp, NO RED)                                     Popsicles (NO RED)                                                               Sports drinks like Gatorade (NO RED)                       The day of surgery:  Drink ONE (1) Pre-Surgery Clear Ensure or G2 at1100 AM ( have completed by )  the morning of surgery. Drink in one sitting. Do not sip.  This drink was given to you during your hospital  pre-op appointment visit. Nothing else to  drink after completing the  Pre-Surgery Clear Ensure or G2.          If you have questions, please contact your surgeon's office.      Oral Hygiene is also important to reduce your risk of infection.                                    Remember - BRUSH YOUR TEETH THE MORNING OF SURGERY  WITH YOUR REGULAR TOOTHPASTE  DENTURES WILL BE REMOVED PRIOR TO SURGERY PLEASE DO NOT APPLY "Poly grip" OR ADHESIVES!!!   Do NOT smoke after Midnight   Stop all vitamins and herbal supplements 7 days before surgery.   Take these medicines the morning of surgery with A SIP OF WATER:  wellbutrin, metoprolol, pepcid, protonix, minoxidil   DO NOT TAKE ANY ORAL DIABETIC MEDICATIONS DAY OF YOUR SURGERY  Bring CPAP mask and tubing day of surgery.                              You may not have any metal on your body including hair pins, jewelry, and body piercing             Do not wear make-up, lotions, powders, perfumes/cologne, or deodorant  Do not wear nail polish including gel and S&S, artificial/acrylic nails, or any other type of covering on natural nails including finger and toenails. If you have artificial nails, gel coating, etc. that needs to be removed by a nail salon please have this removed prior to surgery or surgery may need to be canceled/ delayed if the surgeon/ anesthesia feels like they are unable to be safely monitored.   Do not shave  48 hours prior to surgery.               Men may shave face and neck.   Do not bring valuables to the hospital. [facility] IS NOT             RESPONSIBLE   FOR VALUABLES.   Contacts, glasses, dentures or bridgework may not be worn into surgery.   Bring small overnight bag day of surgery.   DO NOT BRING YOUR HOME MEDICATIONS TO THE HOSPITAL. PHARMACY WILL DISPENSE MEDICATIONS LISTED ON YOUR MEDICATION LIST TO YOU DURING YOUR ADMISSION IN THE HOSPITAL!    Patients discharged on the day of surgery will not be allowed to drive home.  Someone NEEDS to  stay with you for the first 24 hours after anesthesia.   Special Instructions: Bring a copy of your healthcare power of attorney and living will documents the day of surgery if you haven't scanned them before.              Please read over the following fact sheets you were given: IF YOU HAVE QUESTIONS ABOUT YOUR PRE-OP INSTRUCTIONS PLEASE CALL [phone]   If you received a COVID test during your pre-op visit  it is requested that you wear a mask when out in public, stay away from anyone that may not be feeling well and notify your surgeon if you develop symptoms. If you test positive for Covid or have been in contact with anyone that has tested positive in the last 10 days please notify you surgeon.    SURGICAL WAITING ROOM VISITATION  Patients having surgery or a procedure may have no more than 2 support people in the waiting area - these visitors may rotate.    Children under the age of 71 must have an adult with them who is not the patient.  Due to an increase in RSV and influenza rates and associated hospitalizations, children ages 21 and under may not visit patients in [facility] hospitals.  If the patient needs to stay at the hospital during part of their recovery, the visitor guidelines for inpatient rooms apply. Pre-op nurse will coordinate an appropriate time for 1 support person  to accompany patient in pre-op.  This support person may not rotate.    Please refer to the [facility] website for the visitor guidelines for Inpatients (after your surgery is over and you are in a regular room).       Your procedure is scheduled on:    Report to [facility] Main Entrance    Report to admitting at AM   Call this number if you have problems the morning of surgery [phone]   Do not eat food :After Midnight.   After Midnight you may have the following liquids until ______ AM/ PM DAY OF SURGERY  Water Non-Citrus Juices (without pulp, NO RED-Apple, White grape,  White cranberry) Black Coffee (NO MILK/CREAM OR CREAMERS, sugar ok)  Clear Tea (NO MILK/CREAM OR CREAMERS, sugar ok) regular and decaf                             Plain Jell-O (NO RED)                                           Fruit ices (not with fruit pulp, NO RED)                                     Popsicles (NO RED)                                                               Sports drinks like Gatorade (NO RED)              Drink 2 Ensure/G2 drinks AT 10:00 PM the night before surgery.        The day of surgery:  Drink ONE (1) Pre-Surgery Clear Ensure or G2 at AM the morning of surgery. Drink in one sitting. Do not sip.  This drink was given to you during your hospital  pre-op appointment visit. Nothing else to drink after completing the  Pre-Surgery Clear Ensure or G2.          If you have questions, please contact your surgeon's office.   FOLLOW BOWEL PREP AND ANY ADDITIONAL PRE OP INSTRUCTIONS YOU RECEIVED FROM YOUR SURGEON'S OFFICE!!!     Oral Hygiene is also important to reduce your risk of infection.                                    Remember - BRUSH YOUR TEETH THE MORNING OF SURGERY WITH YOUR REGULAR TOOTHPASTE  DENTURES WILL BE REMOVED PRIOR TO SURGERY PLEASE DO NOT APPLY "Poly grip" OR ADHESIVES!!!   Do NOT smoke after Midnight   Stop all vitamins and herbal supplements 7 days before surgery.   Take these medicines the morning of surgery with A SIP OF WATER:   DO NOT TAKE ANY ORAL DIABETIC MEDICATIONS DAY OF YOUR SURGERY  Bring CPAP mask and tubing day of surgery.  You may not have any metal on your body including hair pins, jewelry, and body piercing             Do not wear make-up, lotions, powders, perfumes/cologne, or deodorant  Do not wear nail polish including gel and S&S, artificial/acrylic nails, or any other type of covering on natural nails including finger and toenails. If you have artificial nails, gel coating, etc.  that needs to be removed by a nail salon please have this removed prior to surgery or surgery may need to be canceled/ delayed if the surgeon/ anesthesia feels like they are unable to be safely monitored.   Do not shave  48 hours prior to surgery.               Men may shave face and neck.   Do not bring valuables to the hospital. [facility] IS NOT             RESPONSIBLE   FOR VALUABLES.   Contacts, glasses, dentures or bridgework may not be worn into surgery.   Bring small overnight bag day of surgery.   DO NOT BRING YOUR HOME MEDICATIONS TO THE HOSPITAL. PHARMACY WILL DISPENSE MEDICATIONS LISTED ON YOUR MEDICATION LIST TO YOU DURING YOUR ADMISSION IN THE HOSPITAL!    Patients discharged on the day of surgery will not be allowed to drive home.  Someone NEEDS to stay with you for the first 24 hours after anesthesia.   Special Instructions: Bring a copy of your healthcare power of attorney and living will documents the day of surgery if you haven't scanned them before.              Please read over the following fact sheets you were given: IF YOU HAVE QUESTIONS ABOUT YOUR PRE-OP INSTRUCTIONS PLEASE CALL [phone]   If you received a COVID test during your pre-op visit  it is requested that you wear a mask when out in public, stay away from anyone that may not be feeling well and notify your surgeon if you develop symptoms. If you test positive for Covid or have been in contact with anyone that has tested positive in the last 10 days please notify you surgeon.    [facility] - Preparing for Surgery Before surgery, you can play an important role.  Because skin is not sterile, your skin needs to be as free of germs as possible.  You can reduce the number of germs on your skin by washing with CHG (chlorahexidine gluconate) soap before surgery.  CHG is an antiseptic cleaner which kills germs and bonds with the skin to continue killing germs even after washing. Please DO NOT use if you have  an allergy to CHG or antibacterial soaps.  If your skin becomes reddened/irritated stop using the CHG and inform your nurse when you arrive at Short Stay. Do not shave (including legs and underarms) for at least 48 hours prior to the first CHG shower.  You may shave your face/neck. Please follow these instructions carefully:  1.  Shower with CHG Soap the night before surgery and the  morning of Surgery.  2.  If you choose to wash your hair, wash your hair first as usual with your  normal  shampoo.  3.  After you shampoo, rinse your hair and body thoroughly to remove the  shampoo.  4.  Use CHG as you would any other liquid soap.  You can apply chg directly  to the skin and wash                       Gently with a scrungie or clean washcloth.  5.  Apply the CHG Soap to your body ONLY FROM THE NECK DOWN.   Do not use on face/ open                           Wound or open sores. Avoid contact with eyes, ears mouth and genitals (private parts).                       Wash face,  Genitals (private parts) with your normal soap.             6.  Wash thoroughly, paying special attention to the area where your surgery  will be performed.  7.  Thoroughly rinse your body with warm water from the neck down.  8.  DO NOT shower/wash with your normal soap after using and rinsing off  the CHG Soap.                9.  Pat yourself dry with a clean towel.            10.  Wear clean pajamas.            11.  Place clean sheets on your bed the night of your first shower and do not  sleep with pets. Day of Surgery : Do not apply any lotions/deodorants the morning of surgery.  Please wear clean clothes to the hospital/surgery center.  FAILURE TO FOLLOW THESE INSTRUCTIONS MAY RESULT IN THE CANCELLATION OF YOUR SURGERY PATIENT SIGNATURE_________________________________  NURSE SIGNATURE__________________________________  ________________________________________________________________________

## 2023-05-31 ENCOUNTER — Encounter (HOSPITAL_COMMUNITY)
Admission: RE | Admit: 2023-05-31 | Discharge: 2023-05-31 | Disposition: A | Discharge status: Home / Self Care | Payer: BC Managed Care – PPO | Source: Ambulatory Visit | Attending: General Surgery

## 2023-05-31 ENCOUNTER — Encounter (HOSPITAL_COMMUNITY): Payer: Self-pay

## 2023-05-31 ENCOUNTER — Other Ambulatory Visit: Payer: Self-pay

## 2023-05-31 VITALS — BP 163/96 | HR 58 | Temp 98.1°F | Resp 16 | Ht 64.0 in | Wt 160.0 lb

## 2023-05-31 DIAGNOSIS — K8020 Calculus of gallbladder without cholecystitis without obstruction: Secondary | ICD-10-CM | POA: Diagnosis not present

## 2023-05-31 DIAGNOSIS — Z01812 Encounter for preprocedural laboratory examination: Secondary | ICD-10-CM | POA: Diagnosis not present

## 2023-05-31 DIAGNOSIS — Z01818 Encounter for other preprocedural examination: Secondary | ICD-10-CM | POA: Diagnosis not present

## 2023-05-31 DIAGNOSIS — R9431 Abnormal electrocardiogram [ECG] [EKG]: Secondary | ICD-10-CM | POA: Insufficient documentation

## 2023-05-31 DIAGNOSIS — Z01810 Encounter for preprocedural cardiovascular examination: Secondary | ICD-10-CM | POA: Diagnosis not present

## 2023-05-31 HISTORY — DX: Pneumonia, unspecified organism: J18.9

## 2023-05-31 HISTORY — DX: Dyspnea, unspecified: R06.00

## 2023-05-31 LAB — COMPREHENSIVE METABOLIC PANEL
ALT: 29 U/L (ref 0–44)
AST: 29 U/L (ref 15–41)
Albumin: 4 g/dL (ref 3.5–5.0)
Alkaline Phosphatase: 56 U/L (ref 38–126)
Anion gap: 7 (ref 5–15)
BUN: 17 mg/dL (ref 6–20)
CO2: 27 mmol/L (ref 22–32)
Calcium: 9.2 mg/dL (ref 8.9–10.3)
Chloride: 106 mmol/L (ref 98–111)
Creatinine, Ser: 0.67 mg/dL (ref 0.44–1.00)
GFR, Estimated: >60 mL/min (ref >60)
Glucose, Bld: 87 mg/dL (ref 70–99)
Potassium: 4.1 mmol/L (ref 3.5–5.1)
Sodium: 140 mmol/L (ref 135–145)
Total Bilirubin: 0.6 mg/dL (ref <1.2)
Total Protein: 6.8 g/dL (ref 6.5–8.1)

## 2023-05-31 LAB — CBC WITH DIFFERENTIAL/PLATELET
Abs Immature Granulocytes: 0.01 10*3/uL (ref 0.00–0.07)
Basophils Absolute: 0.1 10*3/uL (ref 0.0–0.1)
Basophils Relative: 1 %
Eosinophils Absolute: 0.2 10*3/uL (ref 0.0–0.5)
Eosinophils Relative: 2 %
HCT: 40.8 % (ref 36.0–46.0)
Hemoglobin: 13.9 g/dL (ref 12.0–15.0)
Immature Granulocytes: 0 %
Lymphocytes Relative: 26 %
Lymphs Abs: 2 10*3/uL (ref 0.7–4.0)
MCH: 34.4 pg — ABNORMAL HIGH (ref 26.0–34.0)
MCHC: 34.1 g/dL (ref 30.0–36.0)
MCV: 101 fL — ABNORMAL HIGH (ref 80.0–100.0)
Monocytes Absolute: 0.6 10*3/uL (ref 0.1–1.0)
Monocytes Relative: 8 %
Neutro Abs: 4.8 10*3/uL (ref 1.7–7.7)
Neutrophils Relative %: 63 %
Platelets: 152 10*3/uL (ref 150–400)
RBC: 4.04 MIL/uL (ref 3.87–5.11)
RDW: 13.2 % (ref 11.5–15.5)
WBC: 7.6 10*3/uL (ref 4.0–10.5)
nRBC: 0 % (ref 0.0–0.2)

## 2023-06-13 ENCOUNTER — Ambulatory Visit (HOSPITAL_COMMUNITY): Payer: BC Managed Care – PPO | Admitting: Anesthesiology

## 2023-06-13 ENCOUNTER — Ambulatory Visit (HOSPITAL_COMMUNITY): Payer: BC Managed Care – PPO | Admitting: Physician Assistant

## 2023-06-13 ENCOUNTER — Ambulatory Visit (HOSPITAL_COMMUNITY)
Admission: RE | Admit: 2023-06-13 | Discharge: 2023-06-13 | Disposition: A | Discharge status: Home / Self Care | Payer: BC Managed Care – PPO | Source: Ambulatory Visit | Attending: General Surgery | Admitting: General Surgery

## 2023-06-13 ENCOUNTER — Encounter (HOSPITAL_COMMUNITY): Payer: Self-pay | Admitting: General Surgery

## 2023-06-13 ENCOUNTER — Encounter (HOSPITAL_COMMUNITY)
Admission: RE | Disposition: A | Discharge status: Home / Self Care | Payer: Self-pay | Source: Ambulatory Visit | Attending: General Surgery

## 2023-06-13 DIAGNOSIS — Z79899 Other long term (current) drug therapy: Secondary | ICD-10-CM | POA: Diagnosis not present

## 2023-06-13 DIAGNOSIS — K8020 Calculus of gallbladder without cholecystitis without obstruction: Secondary | ICD-10-CM | POA: Diagnosis not present

## 2023-06-13 DIAGNOSIS — I10 Essential (primary) hypertension: Secondary | ICD-10-CM | POA: Insufficient documentation

## 2023-06-13 DIAGNOSIS — E785 Hyperlipidemia, unspecified: Secondary | ICD-10-CM | POA: Diagnosis not present

## 2023-06-13 DIAGNOSIS — Z01818 Encounter for other preprocedural examination: Secondary | ICD-10-CM

## 2023-06-13 DIAGNOSIS — F17210 Nicotine dependence, cigarettes, uncomplicated: Secondary | ICD-10-CM | POA: Insufficient documentation

## 2023-06-13 DIAGNOSIS — K8010 Calculus of gallbladder with chronic cholecystitis without obstruction: Secondary | ICD-10-CM | POA: Diagnosis not present

## 2023-06-13 DIAGNOSIS — K219 Gastro-esophageal reflux disease without esophagitis: Secondary | ICD-10-CM | POA: Insufficient documentation

## 2023-06-13 HISTORY — PX: GALLBLADDER SURGERY: SHX652

## 2023-06-13 HISTORY — PX: CHOLECYSTECTOMY: SHX55

## 2023-06-13 SURGERY — LAPAROSCOPIC CHOLECYSTECTOMY
Anesthesia: General

## 2023-06-13 MED ORDER — FENTANYL CITRATE (PF) 250 MCG/5ML IJ SOLN: INTRAMUSCULAR | Status: DC | PRN

## 2023-06-13 MED ORDER — DEXAMETHASONE SODIUM PHOSPHATE 10 MG/ML IJ SOLN
INTRAMUSCULAR | Status: AC
  Filled 2023-06-13: qty 1

## 2023-06-13 MED ORDER — LACTATED RINGERS IV SOLN: INTRAVENOUS | Status: DC

## 2023-06-13 MED ORDER — PROPOFOL 10 MG/ML IV BOLUS
INTRAVENOUS | Status: AC
  Filled 2023-06-13: qty 20

## 2023-06-13 MED ORDER — LACTATED RINGERS IR SOLN
Status: DC | PRN
  Administered 2023-06-13: 1000 mL

## 2023-06-13 MED ORDER — FENTANYL CITRATE (PF) 100 MCG/2ML IJ SOLN
INTRAMUSCULAR | Status: AC
  Filled 2023-06-13: qty 2

## 2023-06-13 MED ORDER — FENTANYL CITRATE (PF) 100 MCG/2ML IJ SOLN
INTRAMUSCULAR | Status: DC | PRN
  Administered 2023-06-13: 100 ug via INTRAVENOUS
  Administered 2023-06-13 (×2): 50 ug via INTRAVENOUS

## 2023-06-13 MED ORDER — OXYCODONE HCL 5 MG PO TABS: 5.0000 mg | ORAL_TABLET | Freq: Four times a day (QID) | ORAL | 0 refills | Status: DC | PRN

## 2023-06-13 MED ORDER — ACETAMINOPHEN 500 MG PO TABS: 1000.0000 mg | ORAL_TABLET | Freq: Three times a day (TID) | ORAL | Status: AC

## 2023-06-13 MED ORDER — LIDOCAINE 2% (20 MG/ML) 5 ML SYRINGE
INTRAMUSCULAR | Status: DC | PRN
  Administered 2023-06-13: 60 mg via INTRAVENOUS

## 2023-06-13 MED ORDER — ONDANSETRON HCL 4 MG/2ML IJ SOLN: 4.0000 mg | Freq: Four times a day (QID) | INTRAMUSCULAR | Status: DC | PRN

## 2023-06-13 MED ORDER — ONDANSETRON HCL 4 MG/2ML IJ SOLN
INTRAMUSCULAR | Status: AC
  Filled 2023-06-13: qty 2

## 2023-06-13 MED ORDER — OXYCODONE HCL 5 MG PO TABS: 5.0000 mg | ORAL_TABLET | Freq: Once | ORAL | Status: DC | PRN

## 2023-06-13 MED ORDER — BUPIVACAINE HCL (PF) 0.25 % IJ SOLN
INTRAMUSCULAR | Status: DC | PRN
  Administered 2023-06-13: 30 mL

## 2023-06-13 MED ORDER — FENTANYL CITRATE PF 50 MCG/ML IJ SOSY
PREFILLED_SYRINGE | INTRAMUSCULAR | Status: AC
  Filled 2023-06-13: qty 1

## 2023-06-13 MED ORDER — ACETAMINOPHEN 500 MG PO TABS
1000.0000 mg | ORAL_TABLET | ORAL | Status: AC
  Administered 2023-06-13: 1000 mg via ORAL
  Filled 2023-06-13: qty 2

## 2023-06-13 MED ORDER — LIDOCAINE HCL (PF) 2 % IJ SOLN
INTRAMUSCULAR | Status: AC
  Filled 2023-06-13: qty 5

## 2023-06-13 MED ORDER — MIDAZOLAM HCL 2 MG/2ML IJ SOLN
INTRAMUSCULAR | Status: AC
Start: 2023-06-13 — End: ?
  Filled 2023-06-13: qty 2

## 2023-06-13 MED ORDER — BUPIVACAINE HCL (PF) 0.25 % IJ SOLN
INTRAMUSCULAR | Status: AC
  Filled 2023-06-13: qty 30

## 2023-06-13 MED ORDER — ROCURONIUM BROMIDE 10 MG/ML (PF) SYRINGE
PREFILLED_SYRINGE | INTRAVENOUS | Status: DC | PRN
  Administered 2023-06-13: 10 mg via INTRAVENOUS
  Administered 2023-06-13: 50 mg via INTRAVENOUS
  Administered 2023-06-13: 10 mg via INTRAVENOUS

## 2023-06-13 MED ORDER — OXYCODONE HCL 5 MG/5ML PO SOLN
5.0000 mg | Freq: Once | ORAL | Status: DC | PRN
Start: 2023-06-13 — End: 2023-06-13

## 2023-06-13 MED ORDER — PROPOFOL 10 MG/ML IV BOLUS
INTRAVENOUS | Status: DC | PRN
  Administered 2023-06-13: 200 mg via INTRAVENOUS

## 2023-06-13 MED ORDER — DEXMEDETOMIDINE HCL IN NACL 80 MCG/20ML IV SOLN
INTRAVENOUS | Status: AC
  Filled 2023-06-13: qty 20

## 2023-06-13 MED ORDER — ONDANSETRON HCL 4 MG/2ML IJ SOLN
INTRAMUSCULAR | Status: DC | PRN
  Administered 2023-06-13: 4 mg via INTRAVENOUS

## 2023-06-13 MED ORDER — FENTANYL CITRATE PF 50 MCG/ML IJ SOSY
25.0000 ug | PREFILLED_SYRINGE | INTRAMUSCULAR | Status: DC | PRN
  Administered 2023-06-13 (×2): 50 ug via INTRAVENOUS

## 2023-06-13 MED ORDER — CHLORHEXIDINE GLUCONATE CLOTH 2 % EX PADS: 6.0000 | MEDICATED_PAD | Freq: Once | CUTANEOUS | Status: DC

## 2023-06-13 MED ORDER — DEXAMETHASONE SODIUM PHOSPHATE 10 MG/ML IJ SOLN
INTRAMUSCULAR | Status: DC | PRN
  Administered 2023-06-13: 10 mg via INTRAVENOUS

## 2023-06-13 MED ORDER — DEXMEDETOMIDINE HCL IN NACL 80 MCG/20ML IV SOLN
INTRAVENOUS | Status: DC | PRN
  Administered 2023-06-13: 8 ug via INTRAVENOUS
  Administered 2023-06-13: 12 ug via INTRAVENOUS

## 2023-06-13 MED ORDER — ORAL CARE MOUTH RINSE: 15.0000 mL | Freq: Once | OROMUCOSAL | Status: AC

## 2023-06-13 MED ORDER — SUGAMMADEX SODIUM 200 MG/2ML IV SOLN
INTRAVENOUS | Status: DC | PRN
  Administered 2023-06-13: 160 mg via INTRAVENOUS

## 2023-06-13 MED ORDER — SODIUM CHLORIDE 0.9 % IV SOLN
2.0000 g | INTRAVENOUS | Status: AC
  Administered 2023-06-13: 2 g via INTRAVENOUS
  Filled 2023-06-13: qty 2

## 2023-06-13 MED ORDER — LACTATED RINGERS IV SOLN: INTRAVENOUS | Status: DC | PRN

## 2023-06-13 MED ORDER — SPY AGENT GREEN - (INDOCYANINE FOR INJECTION)
1.2500 mg | Freq: Once | INTRAMUSCULAR | Status: AC
  Administered 2023-06-13: 1.25 mg via INTRAVENOUS
  Filled 2023-06-13: qty 10

## 2023-06-13 MED ORDER — CHLORHEXIDINE GLUCONATE 0.12 % MT SOLN
15.0000 mL | Freq: Once | OROMUCOSAL | Status: AC
  Administered 2023-06-13: 15 mL via OROMUCOSAL

## 2023-06-13 MED ORDER — MIDAZOLAM HCL 2 MG/2ML IJ SOLN
INTRAMUSCULAR | Status: DC | PRN
  Administered 2023-06-13: 2 mg via INTRAVENOUS

## 2023-06-13 MED ORDER — FENTANYL CITRATE PF 50 MCG/ML IJ SOSY
PREFILLED_SYRINGE | INTRAMUSCULAR | Status: AC
  Filled 2023-06-13: qty 2

## 2023-06-13 SURGICAL SUPPLY — 49 items
APPLICATOR ARISTA FLEXITIP XL (MISCELLANEOUS) IMPLANT
APPLIER CLIP 5 13 M/L LIGAMAX5 (MISCELLANEOUS) ×1
APPLIER CLIP ROT 10 11.4 M/L (STAPLE)
BAG COUNTER SPONGE SURGICOUNT (BAG) IMPLANT
CABLE HIGH FREQUENCY MONO STRZ (ELECTRODE) ×2 IMPLANT
CHLORAPREP W/TINT 26 (MISCELLANEOUS) ×2 IMPLANT
CLIP APPLIE 5 13 M/L LIGAMAX5 (MISCELLANEOUS) IMPLANT
CLIP APPLIE ROT 10 11.4 M/L (STAPLE) IMPLANT
CLIP LIGATING HEMO O LOK GREEN (MISCELLANEOUS) IMPLANT
COVER MAYO STAND XLG (MISCELLANEOUS) IMPLANT
COVER SURGICAL LIGHT HANDLE (MISCELLANEOUS) ×2 IMPLANT
DERMABOND ADVANCED .7 DNX12 (GAUZE/BANDAGES/DRESSINGS) IMPLANT
DRAPE C-ARM 42X120 X-RAY (DRAPES) IMPLANT
DRSG TEGADERM 2-3/8X2-3/4 SM (GAUZE/BANDAGES/DRESSINGS) ×6 IMPLANT
DRSG TEGADERM 4X4.75 (GAUZE/BANDAGES/DRESSINGS) ×2 IMPLANT
ELECT REM PT RETURN 15FT ADLT (MISCELLANEOUS) ×2 IMPLANT
ENDOLOOP SUT PDS II 0 18 (SUTURE) IMPLANT
GAUZE SPONGE 2X2 8PLY STRL LF (GAUZE/BANDAGES/DRESSINGS) ×2 IMPLANT
GLOVE BIO SURGEON STRL SZ7.5 (GLOVE) ×2 IMPLANT
GLOVE INDICATOR 8.0 STRL GRN (GLOVE) ×2 IMPLANT
GOWN STRL REUS W/ TWL XL LVL3 (GOWN DISPOSABLE) ×2 IMPLANT
GRASPER SUT TROCAR 14GX15 (MISCELLANEOUS) IMPLANT
HEMOSTAT ARISTA ABSORB 3G PWDR (HEMOSTASIS) IMPLANT
HEMOSTAT SNOW SURGICEL 2X4 (HEMOSTASIS) IMPLANT
IRRIG SUCT STRYKERFLOW 2 WTIP (MISCELLANEOUS) ×1
IRRIGATION SUCT STRKRFLW 2 WTP (MISCELLANEOUS) ×2 IMPLANT
KIT BASIN OR (CUSTOM PROCEDURE TRAY) ×2 IMPLANT
KIT IMAGING PINPOINTPAQ (MISCELLANEOUS) IMPLANT
KIT TURNOVER KIT A (KITS) IMPLANT
L-HOOK LAP DISP 36CM (ELECTROSURGICAL)
LHOOK LAP DISP 36CM (ELECTROSURGICAL) IMPLANT
POUCH RETRIEVAL ECOSAC 10 (ENDOMECHANICALS) ×2 IMPLANT
SCISSORS LAP 5X35 DISP (ENDOMECHANICALS) ×2 IMPLANT
SET CHOLANGIOGRAPH MIX (MISCELLANEOUS) IMPLANT
SET TUBE SMOKE EVAC HIGH FLOW (TUBING) ×2 IMPLANT
SLEEVE ADV FIXATION 5X100MM (TROCAR) ×2 IMPLANT
SPIKE FLUID TRANSFER (MISCELLANEOUS) ×2 IMPLANT
STRIP CLOSURE SKIN 1/2X4 (GAUZE/BANDAGES/DRESSINGS) ×2 IMPLANT
SUT MNCRL AB 4-0 PS2 18 (SUTURE) ×2 IMPLANT
SUT VIC AB 0 UR5 27 (SUTURE) IMPLANT
SUT VICRYL 0 TIES 12 18 (SUTURE) IMPLANT
SUT VICRYL 0 UR6 27IN ABS (SUTURE) IMPLANT
TOWEL OR 17X26 10 PK STRL BLUE (TOWEL DISPOSABLE) ×2 IMPLANT
TOWEL OR NON WOVEN STRL DISP B (DISPOSABLE) ×2 IMPLANT
TRAY LAPAROSCOPIC (CUSTOM PROCEDURE TRAY) ×2 IMPLANT
TROCAR ADV FIXATION 12X100MM (TROCAR) IMPLANT
TROCAR ADV FIXATION 5X100MM (TROCAR) ×2 IMPLANT
TROCAR BALLN 12MMX100 BLUNT (TROCAR) IMPLANT
TROCAR XCEL NON-BLD 5MMX100MML (ENDOMECHANICALS) IMPLANT

## 2023-06-13 NOTE — Anesthesia Preprocedure Evaluation (Signed)
Anesthesia Evaluation  Patient identified by MRN, date of birth, ID band Patient awake    Reviewed: Allergy & Precautions, H&P , NPO status , Patient's Chart, lab work & pertinent test results  Airway Mallampati: II   Neck ROM: full    Dental   Pulmonary shortness of breath, Current Smoker   breath sounds clear to auscultation       Cardiovascular hypertension,  Rhythm:regular Rate:Normal     Neuro/Psych  Headaches    GI/Hepatic ,GERD  ,,  Endo/Other    Renal/GU      Musculoskeletal   Abdominal   Peds  Hematology   Anesthesia Other Findings   Reproductive/Obstetrics                             Anesthesia Physical Anesthesia Plan  ASA: 2  Anesthesia Plan: General   Post-op Pain Management:    Induction: Intravenous  PONV Risk Score and Plan: 2 and Ondansetron, Dexamethasone, Midazolam and Treatment may vary due to age or medical condition  Airway Management Planned: Oral ETT  Additional Equipment:   Intra-op Plan:   Post-operative Plan: Extubation in OR  Informed Consent: I have reviewed the patients History and Physical, chart, labs and discussed the procedure including the risks, benefits and alternatives for the proposed anesthesia with the patient or authorized representative who has indicated his/her understanding and acceptance.     Dental advisory given  Plan Discussed with: CRNA, Anesthesiologist and Surgeon  Anesthesia Plan Comments:        Anesthesia Quick Evaluation

## 2023-06-13 NOTE — Discharge Instructions (Signed)

## 2023-06-13 NOTE — Anesthesia Procedure Notes (Signed)
Procedure Name: Intubation Date/Time: 06/13/2023 1:12 PM  Performed by: Florene Route, CRNAPre-anesthesia Checklist: Patient identified, Emergency Drugs available, Suction available and Patient being monitored Patient Re-evaluated:Patient Re-evaluated prior to induction Oxygen Delivery Method: Circle system utilized Preoxygenation: Pre-oxygenation with 100% oxygen Induction Type: IV induction Ventilation: Mask ventilation without difficulty and Oral airway inserted - appropriate to patient size Laryngoscope Size: Hyacinth Meeker and 2 Grade View: Grade I Tube type: Oral Tube size: 7.5 mm Number of attempts: 1 Airway Equipment and Method: Stylet and Oral airway Placement Confirmation: ETT inserted through vocal cords under direct vision, positive ETCO2 and breath sounds checked- equal and bilateral Secured at: 21 cm Tube secured with: Tape Dental Injury: Teeth and Oropharynx as per pre-operative assessment

## 2023-06-13 NOTE — Op Note (Signed)
Andrea Owens [id number] 1975-07-25 06/13/2023  Laparoscopic Cholecystectomy with near infrared fluorescent cholangiography procedure Note  Indications: This patient presents with symptomatic gallbladder disease and will undergo laparoscopic cholecystectomy.  Pre-operative Diagnosis: symptomatic cholelithiasis  Post-operative Diagnosis: same  Surgeon: Gaynelle Adu MD FACS  Assistants: none  Anesthesia: General endotracheal anesthesia  Procedure Details  The patient was seen again in the Holding Room. The risks, benefits, complications, treatment options, and expected outcomes were discussed with the patient. The possibilities of reaction to medication, pulmonary aspiration, perforation of viscus, bleeding, recurrent infection, finding a normal gallbladder, the need for additional procedures, failure to diagnose a condition, the possible need to convert to an open procedure, and creating a complication requiring transfusion or operation were discussed with the patient. The likelihood of improving the patient's symptoms with return to their baseline status is good.  The patient and/or family concurred with the proposed plan, giving informed consent. The site of surgery properly noted. The patient was taken to Operating Room, identified as Andrea Owens and the procedure verified as Laparoscopic Cholecystectomy with ICG dye.  A Time Out was held and the above information confirmed. Antibiotic prophylaxis was administered.    ICG dye was administered preoperatively.    General endotracheal anesthesia was then administered and tolerated well. After the induction, the abdomen was prepped with Chloraprep and draped in the sterile fashion. The patient was positioned in the supine position.  Local anesthetic agent was injected into the skin near the umbilicus and an incision made. We dissected down to the abdominal fascia with blunt dissection.  The fascia was incised vertically and we entered the  peritoneal cavity bluntly.  A pursestring suture of 0-Vicryl was placed around the fascial opening.  The Hasson cannula was inserted and secured with the stay suture.  Pneumoperitoneum was then created with CO2 and tolerated well without any adverse changes in the patient's vital signs. An 5-mm port was placed in the subxiphoid position.  Two 5-mm ports were placed in the right upper quadrant. All skin incisions were infiltrated with a local anesthetic agent before making the incision and placing the trocars.   We positioned the patient in reverse Trendelenburg, tilted slightly to the patient's left.  The gallbladder which was distended was identified, the fundus grasped and retracted cephalad. Adhesions were lysed bluntly and with the electrocautery where indicated, taking care not to injure any adjacent organs or viscus. The infundibulum was grasped and retracted laterally, exposing the peritoneum overlying the triangle of Calot. This was then divided and exposed in a blunt fashion. A critical view of the cystic duct and cystic artery was obtained.  The cystic duct was clearly identified and bluntly dissected circumferentially.  Utilizing the Stryker camera system near infrared fluorescent activity was visualized in the liver, cystic duct, common hepatic duct and common bile duct and small bowel.  This served as a secondary confirmation of our anatomy.  The cystic duct was then ligated with two clips on biliary side and divided.  The cystic duct was dilated.  I decided to put a PDS Endoloop around the cystic duct stump.  The cystic artery which had been identified & dissected free was ligated with clips and divided as well.   The gallbladder was dissected from the liver bed in retrograde fashion with the electrocautery. The gallbladder was removed and placed in an Ecco sac.  The gallbladder and Ecco sac were then removed through the umbilical port site. The liver bed was irrigated and inspected. Hemostasis  was achieved with the electrocautery. Copious irrigation was utilized and was repeatedly aspirated until clear.  The pursestring suture was used to close the umbilical fascia.  I did elect to place an additional interrupted 0 Vicryl at the umbilical fascia with a PMI suture passer with laparoscopic guidance.  We again inspected the right upper quadrant for hemostasis.  The umbilical closure was inspected and there was no air leak and nothing trapped within the closure. Pneumoperitoneum was released as we removed the trocars.  4-0 Monocryl was used to close the skin.   Dermabond was applied. The patient was then extubated and brought to the recovery room in stable condition. Instrument, sponge, and needle counts were correct at closure and at the conclusion of the case.   Findings: Positive critical view  Estimated Blood Loss: Minimal         Drains: none         Specimens: Gallbladder           Complications: None; patient tolerated the procedure well.         Disposition: PACU - hemodynamically stable.         Condition: stable  Mary Sella. Andrey Campanile, MD, FACS General, Bariatric, & Minimally Invasive Surgery [facility],  [facility]

## 2023-06-13 NOTE — Transfer of Care (Signed)
Immediate Anesthesia Transfer of Care Note  Patient: Andrea Owens  Procedure(s) Performed: LAPAROSCOPIC CHOLECYSTECTOMY W/ ICG DYE  Patient Location: PACU  Anesthesia Type:General  Level of Consciousness: awake  Airway & Oxygen Therapy: Patient Spontanous Breathing and Patient connected to face mask oxygen  Post-op Assessment: Report given to RN and Post -op Vital signs reviewed and stable  Post vital signs: Reviewed and stable  Last Vitals:  Vitals Value Taken Time  BP 147/75 06/13/23 1438  Temp    Pulse 65 06/13/23 1440  Resp 20 06/13/23 1440  SpO2 100 % 06/13/23 1440  Vitals shown include unfiled device data.  Last Pain:  Vitals:   06/13/23 1100  TempSrc: Oral         Complications: No notable events documented.

## 2023-06-13 NOTE — H&P (Signed)
CC: here for surgery  Requesting provider: n/a  HPI: Andrea Owens is an 47 y.o. female who is here for laparoscopic cholecystectomy with ICG dye.  She denies any medical changes since I last saw her.   Old hpi: She saw her primary care team in late August for routine follow-up. At that time she complained of some right upper quadrant pain radiating to her back. She also had nausea. She would also have some loose stool after eating. The symptoms started in early August late July. She also complained of worsening GERD. They added Protonix to her regimen and encouraged her to take Pepcid at night. They ordered a right upper quadrant ultrasound and H. pylori breath test.  Past medical history is hypertension, hyperlipidemia, tobacco use  She works intermittent episodes of right upper quadrant sharp pain that will radiate to her back and shoulder. It will be associate with nausea and bloating. It can last seconds to minutes. Can occur after eating but also can occur at other times. Pain is not constant. Has daily bowel movements. Sometimes will have loose stools after eating. Has had a laparoscopic assisted hysterectomy. No chest pain, chest pressure or shortness of breath.  Past Medical History:  Diagnosis Date   Allergy    Dyspnea    Endometriosis    GERD (gastroesophageal reflux disease)    Hyperlipidemia    Hypertension    Pneumonia    Prediabetes    Vitamin D deficiency     Past Surgical History:  Procedure Laterality Date   ABDOMINAL HYSTERECTOMY  03/2015   Dr. Rana Snare   REFRACTIVE SURGERY  2004    Family History  Problem Relation Age of Onset   Hyperlipidemia Mother    Irritable bowel syndrome Mother    Heart disease Father    Hyperlipidemia Father    Hypertension Father    Breast cancer Maternal Aunt        Great Aunt   Prostate cancer Maternal Grandfather    Colon cancer Neg Hx    Esophageal cancer Neg Hx    Stomach cancer Neg Hx    Rectal cancer Neg Hx      Social:  reports that she has been smoking cigarettes. She has a 10 pack-year smoking history. She has never used smokeless tobacco. She reports current alcohol use. She reports that she does not use drugs.  Allergies:  Allergies  Allergen Reactions   Penicillins Rash    Medications: I have reviewed the patient's current medications.   ROS - all of the below systems have been reviewed with the patient and positives are indicated with bold text General: chills, fever or night sweats Eyes: blurry vision or double vision ENT: epistaxis or sore throat Allergy/Immunology: itchy/watery eyes or nasal congestion Hematologic/Lymphatic: bleeding problems, blood clots or swollen lymph nodes Endocrine: temperature intolerance or unexpected weight changes Breast: new or changing breast lumps or nipple discharge Resp: cough, shortness of breath, or wheezing CV: chest pain or dyspnea on exertion GI: as per HPI GU: dysuria, trouble voiding, or hematuria MSK: joint pain or joint stiffness Neuro: TIA or stroke symptoms Derm: pruritus and skin lesion changes Psych: anxiety and depression  PE Blood pressure (!) 144/84, pulse (!) 58, temperature 98.3 F (36.8 C), temperature source Oral, resp. rate 16, height 5\' 4"  (1.626 m), weight 72.6 kg, last menstrual period 03/15/2015, SpO2 100%. Constitutional: NAD; conversant; no deformities Eyes: Moist conjunctiva; no lid lag; anicteric; PERRL Neck: Trachea midline; no thyromegaly Lungs: Normal respiratory effort;  no tactile fremitus CV: RRR; no palpable thrills; no pitting edema GI: Abd soft, nt, nd; no palpable hepatosplenomegaly MSK: Normal gait; no clubbing/cyanosis Psychiatric: Appropriate affect; alert and oriented x3 Lymphatic: No palpable cervical or axillary lymphadenopathy Skin:no rash, lesions  No results found for this or any previous visit (from the past 48 hour(s)).  No results found.  Imaging: reviewed  A/P: Andrea Owens  is an 47 y.o. female with  Symptomatic cholelithiasis   To OR for lap chole with ICG dye All questions asked and answered IV abx ERAS   Mary Sella. Andrey Campanile, MD, FACS General, Bariatric, & Minimally Invasive Surgery [facility]

## 2023-06-14 ENCOUNTER — Encounter (HOSPITAL_COMMUNITY): Payer: Self-pay | Admitting: General Surgery

## 2023-06-14 ENCOUNTER — Other Ambulatory Visit: Payer: Self-pay

## 2023-06-14 NOTE — Anesthesia Postprocedure Evaluation (Signed)
Anesthesia Post Note  Patient: Andrea Owens  Procedure(s) Performed: LAPAROSCOPIC CHOLECYSTECTOMY W/ ICG DYE     Patient location during evaluation: PACU Anesthesia Type: General Level of consciousness: awake and alert Pain management: pain level controlled Vital Signs Assessment: post-procedure vital signs reviewed and stable Respiratory status: spontaneous breathing, nonlabored ventilation, respiratory function stable and patient connected to nasal cannula oxygen Cardiovascular status: blood pressure returned to baseline and stable Postop Assessment: no apparent nausea or vomiting Anesthetic complications: no   No notable events documented.  Last Vitals:  Vitals:   06/13/23 1515 06/13/23 1539  BP: (!) 129/92 (!) 154/93  Pulse: 60 64  Resp: 12 12  Temp:  36.5 C  SpO2: 95% 95%    Last Pain:  Vitals:   06/13/23 1539  TempSrc: Oral  PainSc: 5                  Overton Boggus S

## 2023-06-15 LAB — SURGICAL PATHOLOGY

## 2023-06-28 ENCOUNTER — Other Ambulatory Visit: Payer: Self-pay | Admitting: Nurse Practitioner

## 2023-06-28 DIAGNOSIS — K219 Gastro-esophageal reflux disease without esophagitis: Secondary | ICD-10-CM

## 2023-07-12 ENCOUNTER — Other Ambulatory Visit: Payer: Self-pay | Admitting: Internal Medicine

## 2023-07-29 ENCOUNTER — Other Ambulatory Visit: Payer: Self-pay | Admitting: Nurse Practitioner

## 2023-07-29 DIAGNOSIS — Z9109 Other allergy status, other than to drugs and biological substances: Secondary | ICD-10-CM

## 2023-10-14 ENCOUNTER — Encounter: Payer: BC Managed Care – PPO | Admitting: Nurse Practitioner

## 2023-10-27 ENCOUNTER — Ambulatory Visit: Admitting: Physician Assistant

## 2023-10-27 ENCOUNTER — Encounter: Payer: Self-pay | Admitting: Physician Assistant

## 2023-10-27 VITALS — BP 138/82 | HR 71 | Temp 97.7°F | Ht 64.0 in | Wt 165.2 lb

## 2023-10-27 DIAGNOSIS — R635 Abnormal weight gain: Secondary | ICD-10-CM | POA: Insufficient documentation

## 2023-10-27 DIAGNOSIS — Z1159 Encounter for screening for other viral diseases: Secondary | ICD-10-CM | POA: Insufficient documentation

## 2023-10-27 DIAGNOSIS — I10 Essential (primary) hypertension: Secondary | ICD-10-CM | POA: Diagnosis not present

## 2023-10-27 DIAGNOSIS — F17200 Nicotine dependence, unspecified, uncomplicated: Secondary | ICD-10-CM

## 2023-10-27 DIAGNOSIS — K588 Other irritable bowel syndrome: Secondary | ICD-10-CM | POA: Diagnosis not present

## 2023-10-27 DIAGNOSIS — K219 Gastro-esophageal reflux disease without esophagitis: Secondary | ICD-10-CM

## 2023-10-27 DIAGNOSIS — K2100 Gastro-esophageal reflux disease with esophagitis, without bleeding: Secondary | ICD-10-CM

## 2023-10-27 DIAGNOSIS — E785 Hyperlipidemia, unspecified: Secondary | ICD-10-CM

## 2023-10-27 DIAGNOSIS — K921 Melena: Secondary | ICD-10-CM | POA: Insufficient documentation

## 2023-10-27 MED ORDER — OLMESARTAN MEDOXOMIL-HCTZ 40-25 MG PO TABS: 1.0000 | ORAL_TABLET | Freq: Every day | ORAL | 3 refills | Status: DC

## 2023-10-27 MED ORDER — DICYCLOMINE HCL 20 MG PO TABS: 20.0000 mg | ORAL_TABLET | Freq: Three times a day (TID) | ORAL | 3 refills | Status: DC

## 2023-10-27 MED ORDER — PANTOPRAZOLE SODIUM 40 MG PO TBEC: 40.0000 mg | DELAYED_RELEASE_TABLET | Freq: Every day | ORAL | 0 refills | Status: AC

## 2023-10-27 NOTE — Assessment & Plan Note (Signed)
 Long-standing hyperlipidemia with aortic plaque. Discussed potential for more aggressive lipid-lowering therapy if needed. - Continue current statin therapy. - Consider Repatha if cholesterol remains uncontrolled or plaque buildup progresses.

## 2023-10-27 NOTE — Assessment & Plan Note (Signed)
 Long history of smoking, currently reduced. Discussed Wellbutrin for cessation support. - Continue Wellbutrin for depression and smoking cessation support. - Encourage continued efforts to reduce smoking.

## 2023-10-27 NOTE — Assessment & Plan Note (Signed)
 Long-standing hypertension with slightly elevated blood pressure today. Discussed medication regimen simplification and potential cardiology referral if uncontrolled. Thyroid dysfunction considered as a contributing factor. - Consider combining hydrochlorothiazide and olmesartan into one tablet. - Monitor blood pressure closely. - Consider cardiology referral if blood pressure remains uncontrolled. - Order thyroid panel to assess for potential contribution to hypertension.

## 2023-10-27 NOTE — Patient Instructions (Signed)
 VISIT SUMMARY:  You came in today to establish care and discuss several health concerns, including acid reflux, irregular bowel movements, weight gain, frequent headaches, and your history of hypertension and high cholesterol. We reviewed your current medications and discussed potential changes to better manage your conditions.  YOUR PLAN:  -HYPERTENSION: Hypertension is high blood pressure, which can lead to serious health problems if not controlled. We discussed simplifying your medication regimen by combining hydrochlorothiazide and olmesartan into one tablet. We will monitor your blood pressure closely and consider a cardiology referral if it remains uncontrolled. We also ordered a thyroid panel to check if thyroid issues are contributing to your high blood pressure.  -GASTROESOPHAGEAL REFLUX DISEASE (GERD): GERD is a condition where stomach acid frequently flows back into the tube connecting your mouth and stomach, causing irritation. We prescribed Protonix (pantoprazole) to reduce acid production. If Protonix is not effective, we may switch to omeprazole. We also referred you to gastroenterology for further evaluation, including a possible endoscopy.  -IRRITABLE BOWEL SYNDROME (IBS): IBS is a disorder affecting the large intestine, causing symptoms like cramping, abdominal pain, bloating, gas, and diarrhea or constipation. We prescribed Bentyl (dicyclomine) for abdominal spasms and referred you to gastroenterology for a colonoscopy and further evaluation. We also ordered iron studies to check for anemia due to blood loss.  -HYPERLIPIDEMIA: Hyperlipidemia is having high levels of fats (lipids) in your blood, which can increase the risk of heart disease. We will continue your current statin therapy and consider adding Repatha if your cholesterol remains uncontrolled or if plaque buildup progresses.  -WEIGHT GAIN: We discussed your significant weight gain despite maintaining a low intake diet and  regular exercise. We ordered a thyroid panel to check for hypothyroidism, which can affect metabolism and contribute to weight gain.  -SMOKING CESSATION: We discussed your long history of smoking and your current efforts to reduce it. We will continue Wellbutrin to support both your depression and smoking cessation efforts. Keep up the good work in reducing your smoking.  -GENERAL HEALTH MAINTENANCE: We discussed the importance of routine health maintenance, including managing cardiovascular risk through lifestyle changes. We ordered labs to check your cholesterol, A1c, and hepatitis C. Continue regular exercise and a healthy diet to manage your weight and cardiovascular risk.  INSTRUCTIONS:  Please schedule a follow-up appointment in three months to review your lab results and adjust your treatment as needed. Monitor MyChart for your lab results and any comments from your provider.

## 2023-10-27 NOTE — Progress Notes (Signed)
 Subjective:  Patient ID: Andrea Owens, female    DOB: 07-Jul-1976  Age: 48 y.o. MRN: [id number]  Chief Complaint  Patient presents with   Establish Care    Patient is establishing as a new patient.  Discussed the use of AI scribe software for clinical note transcription with the patient, who gave verbal consent to proceed.  History of Present Illness   The patient, with a past medical history of hypertension, high cholesterol, and gallbladder removal, presents to establish care and discuss multiple health concerns. The patient reports taking hydrochlorothiazide and omnisartan for blood pressure control.  The patient has been experiencing increased acid reflux symptoms, despite taking Pepcid regularly for the past two months. She reports a sore throat and difficulty swallowing medication, but not food. The patient has a history of an endoscopy performed several years ago and was previously on omeprazole for acid reflux.  The patient also reports irregular bowel movements and occasional blood in the stool since her gallbladder removal. She describes the stool as being very dark and foul-smelling. The patient has a history of irritable bowel syndrome and has previously taken Bentyl for this condition.  The patient has gained significant weight since October of the previous year, despite maintaining a low intake diet and walking three times a week. She has a family history of thyroid issues.  The patient also reports frequent headaches, waking up four to five times a week with a headache. She has a history of poor sleep, waking up multiple times a night.  The patient has a 20+ year history of smoking, currently half a pack a day. She has been taking Wellbutrin for depression for the past five years.           10/27/2023   10:53 AM  Depression screen PHQ 2/9  Decreased Interest 0  Down, Depressed, Hopeless 0  PHQ - 2 Score 0         10/27/2023   10:53 AM  Fall Risk  Falls in the  past year? 1  Was there an injury with Fall? 0  Fall Risk Category Calculator 2  Patient at Risk for Falls Due to History of fall(s)     Current Outpatient Medications on File Prior to Visit  Medication Sig Dispense Refill   buPROPion (WELLBUTRIN XL) 300 MG 24 hr tablet TAKE 1 TABLET BY MOUTH  EVERY MORNING FOR MOOD,  FOCUS AND CONCENTRATION 90 tablet 3   cholecalciferol (VITAMIN D) 1000 UNITS tablet Take 1,000 Units by mouth daily.     Cyanocobalamin (VITAMIN B 12 PO) Take 1 tablet by mouth daily.     cyclobenzaprine (FLEXERIL) 5 MG tablet Take 1 tablet (5 mg total) by mouth 3 (three) times daily as needed for muscle spasms. 60 tablet 0   estradiol (DOTTI) 0.1 MG/24HR patch Place 1 patch onto the skin 2 (two) times a week.     famotidine (PEPCID) 40 MG tablet Take 40 mg by mouth daily as needed for heartburn or indigestion.     fluticasone (FLONASE) 50 MCG/ACT nasal spray Place 2 sprays into both nostrils at bedtime. (Patient taking differently: Place 2 sprays into both nostrils daily as needed for allergies.) 16 g 1   loratadine (CLARITIN) 10 MG tablet Take 10 mg by mouth daily.     Magnesium 250 MG TABS Take 250 mg by mouth daily.     metoprolol tartrate (LOPRESSOR) 100 MG tablet Take 1 tablet (100 mg total) by mouth 2 (two) times daily.  180 tablet 2   minoxidil (LONITEN) 10 MG tablet TAKE 1/2 TABLET BY MOUTH DAILY IN THE MORNING FOR BLOOD PRESSURE 45 tablet 3   montelukast (SINGULAIR) 10 MG tablet TAKE 1 TABLET BY MOUTH DAILY 90 tablet 3   rosuvastatin (CRESTOR) 10 MG tablet Take  1 tablet  Daily for Cholesterol        /       TAKE      BY      MOUTH 90 tablet 3   TURMERIC PO Take 500 mg by mouth daily.     No current facility-administered medications on file prior to visit.  . Social History   Socioeconomic History   Marital status: Married    Spouse name: Not on file   Number of children: 0   Years of education: Not on file   Highest education level: 12th grade  Occupational  History   Occupation: Production designer, theatre/television/film in Training and development officer: [employer]  Tobacco Use   Smoking status: Every Day    Current packs/day: 0.50    Average packs/day: 0.5 packs/day for 20.0 years (10.0 ttl pk-yrs)    Types: Cigarettes   Smokeless tobacco: Never  Vaping Use   Vaping status: Never Used  Substance and Sexual Activity   Alcohol use: Yes    Comment: social   Drug use: No   Sexual activity: Not on file    Comment: on period  Other Topics Concern   Not on file  Social History Narrative   Not on file   Social Drivers of Health   Financial Resource Strain: Low Risk  (10/26/2023)   Overall Financial Resource Strain (CARDIA)    Difficulty of Paying Living Expenses: Not very hard  Food Insecurity: No Food Insecurity (10/26/2023)   Hunger Vital Sign    Worried About Running Out of Food in the Last Year: Never true    Ran Out of Food in the Last Year: Never true  Transportation Needs: No Transportation Needs (10/26/2023)   PRAPARE - Administrator, Civil Service (Medical): No    Lack of Transportation (Non-Medical): No  Physical Activity: Insufficiently Active (10/26/2023)   Exercise Vital Sign    Days of Exercise per Week: 3 days    Minutes of Exercise per Session: 30 min  Stress: Stress Concern Present (10/26/2023)   [employer] of Occupational Health - Occupational Stress Questionnaire    Feeling of Stress : To some extent  Social Connections: Moderately Integrated (10/26/2023)   Social Connection and Isolation Panel [NHANES]    Frequency of Communication with Friends and Family: Twice a week    Frequency of Social Gatherings with Friends and Family: Twice a week    Attends Religious Services: More than 4 times per year    Active Member of [employer] or Organizations: No    Attends Engineer, structural: Not on file    Marital Status: Married   Past Medical History:  Diagnosis Date   Allergy    Dyspnea    Endometriosis    GERD (gastroesophageal  reflux disease)    Hyperlipidemia    Hypertension    Pneumonia    Prediabetes    Vitamin D deficiency    Family History  Problem Relation Age of Onset   Hyperlipidemia Mother    Irritable bowel syndrome Mother    Heart disease Father    Hyperlipidemia Father    Hypertension Father    Breast cancer Maternal Aunt  Great Aunt   Prostate cancer Maternal Grandfather    Colon cancer Neg Hx    Esophageal cancer Neg Hx    Stomach cancer Neg Hx    Rectal cancer Neg Hx     Review of Systems  Constitutional:  Negative for chills, fatigue and fever.  HENT:  Negative for congestion, ear pain and sore throat.   Respiratory:  Negative for cough and shortness of breath.   Cardiovascular:  Negative for chest pain.  Gastrointestinal:  Negative for abdominal pain, constipation, diarrhea, nausea and vomiting.  Genitourinary:  Negative for dysuria and frequency.  Musculoskeletal:  Negative for arthralgias and myalgias.  Neurological:  Negative for dizziness and headaches.  Psychiatric/Behavioral:  Negative for dysphoric mood. The patient is not nervous/anxious.      Objective:  BP 138/82   Pulse 71   Temp 97.7 F (36.5 C)   Ht 5\' 4"  (1.626 m)   Wt 165 lb 3.2 oz (74.9 kg)   LMP 03/15/2015   SpO2 98%   BMI 28.36 kg/m      10/27/2023   10:50 AM 06/13/2023    3:39 PM 06/13/2023    3:15 PM  BP/Weight  Systolic BP 138 154 129  Diastolic BP 82 93 92  Wt. (Lbs) 165.2    BMI 28.36 kg/m2      Physical Exam Vitals reviewed.  Constitutional:      Appearance: Normal appearance.  HENT:     Right Ear: Tympanic membrane normal.     Left Ear: Tympanic membrane normal.     Nose: Nose normal.     Mouth/Throat:     Pharynx: No oropharyngeal exudate or posterior oropharyngeal erythema.  Eyes:     Conjunctiva/sclera: Conjunctivae normal.  Cardiovascular:     Rate and Rhythm: Normal rate and regular rhythm.     Heart sounds: Normal heart sounds.  Pulmonary:     Effort: Pulmonary  effort is normal.     Breath sounds: Normal breath sounds.  Abdominal:     General: Bowel sounds are normal. There is no distension.     Palpations: Abdomen is soft.     Tenderness: There is abdominal tenderness. There is no right CVA tenderness, left CVA tenderness, guarding or rebound.     Hernia: No hernia is present.  Skin:    Findings: No lesion or rash.  Neurological:     Mental Status: She is alert and oriented to person, place, and time.  Psychiatric:        Mood and Affect: Mood normal.        Behavior: Behavior normal.    Diabetic Foot Exam - Simple   No data filed      Lab Results  Component Value Date   WBC 7.6 05/31/2023   HGB 13.9 05/31/2023   HCT 40.8 05/31/2023   PLT 152 05/31/2023   GLUCOSE 87 05/31/2023   CHOL 188 03/09/2023   TRIG 148 03/09/2023   HDL 53 03/09/2023   LDLCALC 109 (H) 03/09/2023   ALT 29 05/31/2023   AST 29 05/31/2023   NA 140 05/31/2023   K 4.1 05/31/2023   CL 106 05/31/2023   CREATININE 0.67 05/31/2023   BUN 17 05/31/2023   CO2 27 05/31/2023   TSH 0.83 10/14/2022   HGBA1C 5.6 10/14/2022   MICROALBUR <0.2 10/14/2022      Assessment & Plan:  Primary hypertension Assessment & Plan: Long-standing hypertension with slightly elevated blood pressure today. Discussed medication regimen simplification and potential cardiology  referral if uncontrolled. Thyroid dysfunction considered as a contributing factor. - Consider combining hydrochlorothiazide and olmesartan into one tablet. - Monitor blood pressure closely. - Consider cardiology referral if blood pressure remains uncontrolled. - Order thyroid panel to assess for potential contribution to hypertension.  Orders: -     CBC with Differential/Platelet -     Comprehensive metabolic panel with GFR -     Olmesartan Medoxomil-HCTZ; Take 1 tablet by mouth daily.  Dispense: 90 tablet; Refill: 3 -     Thyroid Panel With TSH -     Thyroid peroxidase antibody  Gastroesophageal reflux  disease with esophagitis without hemorrhage Assessment & Plan: Recent exacerbation of GERD symptoms despite dietary management. Discussed stronger acid suppression therapy and potential esophageal damage if untreated. - Prescribe Protonix (pantoprazole) once daily for acid reduction. - Consider omeprazole if Protonix is not effective. - Refer to gastroenterology for further evaluation, including possible endoscopy.  Orders: -     Ambulatory referral to Gastroenterology -     Comprehensive metabolic panel with GFR  Hyperlipidemia, unspecified hyperlipidemia type Assessment & Plan: Long-standing hyperlipidemia with aortic plaque. Discussed potential for more aggressive lipid-lowering therapy if needed. - Continue current statin therapy. - Consider Repatha if cholesterol remains uncontrolled or plaque buildup progresses.  Orders: -     Comprehensive metabolic panel with GFR -     Lipid panel -     Hemoglobin A1c  Other irritable bowel syndrome Assessment & Plan: IBS exacerbation post-cholecystectomy with concerning blood in stool. Discussed Bentyl for spasms and need for further GI evaluation. - Prescribe Bentyl (dicyclomine) for abdominal spasms. - Refer to gastroenterology for colonoscopy and further evaluation. - Order iron studies to assess for anemia due to blood loss.  Orders: -     Dicyclomine HCl; Take 1 tablet (20 mg total) by mouth 4 (four) times daily -  before meals and at bedtime.  Dispense: 90 tablet; Refill: 3  Abnormal weight gain -     Thyroid Panel With TSH  Bloody stool Assessment & Plan: Continue to monitor Will adjust treatment depending on labs Referral sent to GI  Orders: -     Iron, TIBC and Ferritin Panel  Gastroesophageal reflux disease, unspecified whether esophagitis present Assessment & Plan: Recent exacerbation of GERD symptoms despite dietary management. Discussed stronger acid suppression therapy and potential esophageal damage if  untreated. - Prescribe Protonix (pantoprazole) once daily for acid reduction. - Consider omeprazole if Protonix is not effective. - Refer to gastroenterology for further evaluation, including possible endoscopy.  Orders: -     Pantoprazole Sodium; Take 1 tablet (40 mg total) by mouth daily.  Dispense: 30 tablet; Refill: 0  Tobacco use disorder Assessment & Plan: Long history of smoking, currently reduced. Discussed Wellbutrin for cessation support. - Continue Wellbutrin for depression and smoking cessation support. - Encourage continued efforts to reduce smoking.   Need for hepatitis C screening test -     Hepatitis C antibody       Meds ordered this encounter  Medications   olmesartan-hydrochlorothiazide (BENICAR HCT) 40-25 MG tablet    Sig: Take 1 tablet by mouth daily.    Dispense:  90 tablet    Refill:  3   dicyclomine (BENTYL) 20 MG tablet    Sig: Take 1 tablet (20 mg total) by mouth 4 (four) times daily -  before meals and at bedtime.    Dispense:  90 tablet    Refill:  3   pantoprazole (PROTONIX) 40 MG tablet  Sig: Take 1 tablet (40 mg total) by mouth daily.    Dispense:  30 tablet    Refill:  0    Requesting 1 year supply   Orders Placed This Encounter  Procedures   CBC with Differential   Comprehensive metabolic panel with GFR   Lipid Panel   Hepatitis C antibody   Thyroid Panel With TSH   Thyroid peroxidase antibody   Hemoglobin A1c   Iron, TIBC and Ferritin Panel   Ambulatory referral to Gastroenterology    Weight Gain Significant weight gain despite dietary control and exercise. Discussed potential thyroid dysfunction affecting metabolism. - Order thyroid panel to assess for hypothyroidism.  General Health Maintenance Routine health maintenance discussed with emphasis on managing cardiovascular risk through lifestyle. - Order labs including cholesterol, A1c, and hepatitis C screening. - Encourage regular exercise and healthy diet to manage weight  and cardiovascular risk.  Follow-up Follow-up plan established to monitor treatment response. - Schedule follow-up appointment in three months to review lab results and adjust treatment as needed. - Monitor MyChart for lab results and provider comments.        Follow-up: Return in about 3 months (around 01/26/2024) for Chronic, Mirian Ames.  AVS was given to patient prior to departure.  Odilia Bennett [facility] [phone]

## 2023-10-27 NOTE — Assessment & Plan Note (Signed)
 Continue to monitor Will adjust treatment depending on labs Referral sent to GI

## 2023-10-27 NOTE — Assessment & Plan Note (Signed)
 IBS exacerbation post-cholecystectomy with concerning blood in stool. Discussed Bentyl for spasms and need for further GI evaluation. - Prescribe Bentyl (dicyclomine) for abdominal spasms. - Refer to gastroenterology for colonoscopy and further evaluation. - Order iron studies to assess for anemia due to blood loss.

## 2023-10-27 NOTE — Assessment & Plan Note (Signed)
 Recent exacerbation of GERD symptoms despite dietary management. Discussed stronger acid suppression therapy and potential esophageal damage if untreated. - Prescribe Protonix (pantoprazole) once daily for acid reduction. - Consider omeprazole if Protonix is not effective. - Refer to gastroenterology for further evaluation, including possible endoscopy.

## 2023-10-28 LAB — LIPID PANEL
Chol/HDL Ratio: 4.1 ratio (ref 0.0–4.4)
Cholesterol, Total: 196 mg/dL (ref 100–199)
HDL: 48 mg/dL (ref >39)
LDL Chol Calc (NIH): 122 mg/dL — ABNORMAL HIGH (ref 0–99)
Triglycerides: 144 mg/dL (ref 0–149)
VLDL Cholesterol Cal: 26 mg/dL (ref 5–40)

## 2023-10-28 LAB — CBC WITH DIFFERENTIAL/PLATELET
Basophils Absolute: 0 10*3/uL (ref 0.0–0.2)
Basos: 1 %
EOS (ABSOLUTE): 0.1 10*3/uL (ref 0.0–0.4)
Eos: 1 %
Hematocrit: 43 % (ref 34.0–46.6)
Hemoglobin: 14.3 g/dL (ref 11.1–15.9)
Immature Grans (Abs): 0 10*3/uL (ref 0.0–0.1)
Immature Granulocytes: 0 %
Lymphocytes Absolute: 2.8 10*3/uL (ref 0.7–3.1)
Lymphs: 37 %
MCH: 32.8 pg (ref 26.6–33.0)
MCHC: 33.3 g/dL (ref 31.5–35.7)
MCV: 99 fL — ABNORMAL HIGH (ref 79–97)
Monocytes Absolute: 0.6 10*3/uL (ref 0.1–0.9)
Monocytes: 8 %
Neutrophils Absolute: 4 10*3/uL (ref 1.4–7.0)
Neutrophils: 53 %
Platelets: 168 10*3/uL (ref 150–450)
RBC: 4.36 x10E6/uL (ref 3.77–5.28)
RDW: 12.7 % (ref 11.7–15.4)
WBC: 7.7 10*3/uL (ref 3.4–10.8)

## 2023-10-28 LAB — THYROID PANEL WITH TSH
Free Thyroxine Index: 1.5 (ref 1.2–4.9)
T3 Uptake Ratio: 26 % (ref 24–39)
T4, Total: 5.9 ug/dL (ref 4.5–12.0)
TSH: 1.06 u[IU]/mL (ref 0.450–4.500)

## 2023-10-28 LAB — THYROID PEROXIDASE ANTIBODY: Thyroperoxidase Ab SerPl-aCnc: <9 [IU]/mL (ref 0–34)

## 2023-10-28 LAB — COMPREHENSIVE METABOLIC PANEL WITH GFR
ALT: 25 IU/L (ref 0–32)
AST: 25 IU/L (ref 0–40)
Albumin: 4.7 g/dL (ref 3.9–4.9)
Alkaline Phosphatase: 87 IU/L (ref 44–121)
BUN/Creatinine Ratio: 18 (ref 9–23)
BUN: 16 mg/dL (ref 6–24)
Bilirubin Total: 0.2 mg/dL (ref 0.0–1.2)
CO2: 28 mmol/L (ref 20–29)
Calcium: 10.2 mg/dL (ref 8.7–10.2)
Chloride: 103 mmol/L (ref 96–106)
Creatinine, Ser: 0.89 mg/dL (ref 0.57–1.00)
Globulin, Total: 2 g/dL (ref 1.5–4.5)
Glucose: 83 mg/dL (ref 70–99)
Potassium: 4.6 mmol/L (ref 3.5–5.2)
Sodium: 142 mmol/L (ref 134–144)
Total Protein: 6.7 g/dL (ref 6.0–8.5)
eGFR: 79 mL/min/{1.73_m2} (ref >59)

## 2023-10-28 LAB — HEMOGLOBIN A1C
Est. average glucose Bld gHb Est-mCnc: 114 mg/dL
Hgb A1c MFr Bld: 5.6 % (ref 4.8–5.6)

## 2023-10-28 LAB — HEPATITIS C ANTIBODY: Hep C Virus Ab: NONREACTIVE

## 2023-11-01 ENCOUNTER — Encounter: Payer: Self-pay | Admitting: Physician Assistant

## 2023-11-08 ENCOUNTER — Telehealth: Payer: Self-pay

## 2023-11-08 NOTE — Telephone Encounter (Addendum)
 Called patient left message for patient to call office back   ----- Message from Odilia Bennett sent at 11/07/2023  8:39 PM EDT ----- Regarding: RE: Labs Can you please call the patient and see if she is wanting to have that test drawn for or if she is ok doing it at her 3 month follow up? ----- Message ----- From: Caryl Clas, CMA Sent: 11/01/2023  11:27 AM EDT To: Odilia Bennett, [place] Subject: RE: Labs                                       I looked in [facility] and it looks like it was not put in with the other orders through [facility]. We would have to call patient back in for labs. ----- Message ----- From: Odilia Bennett, PA Sent: 11/01/2023  10:51 AM EDT To: [facility] Subject: Labs                                           I'm not seeing the results for her iron. I see I put the order in but I dont see any other comments or anything else about it

## 2024-01-23 ENCOUNTER — Telehealth: Payer: Self-pay

## 2024-01-23 NOTE — Telephone Encounter (Signed)
 I left a message on the number(s) listed in the patients chart requesting the patient to call back regarding the upcomming appointment for 01/27/2024. The provider is out of the office that day. The appointment has been canceled. Waiting for the patient to return the call.

## 2024-01-27 ENCOUNTER — Ambulatory Visit: Admitting: Physician Assistant

## 2024-01-30 ENCOUNTER — Encounter: Payer: Self-pay | Admitting: Nurse Practitioner

## 2024-01-30 ENCOUNTER — Ambulatory Visit: Admitting: Physician Assistant

## 2024-01-30 ENCOUNTER — Encounter: Payer: Self-pay | Admitting: Physician Assistant

## 2024-01-30 VITALS — BP 138/80 | HR 60 | Temp 98.2°F | Resp 18 | Ht 63.5 in | Wt 165.4 lb

## 2024-01-30 DIAGNOSIS — I10 Essential (primary) hypertension: Secondary | ICD-10-CM | POA: Diagnosis not present

## 2024-01-30 DIAGNOSIS — K921 Melena: Secondary | ICD-10-CM | POA: Diagnosis not present

## 2024-01-30 DIAGNOSIS — J0111 Acute recurrent frontal sinusitis: Secondary | ICD-10-CM | POA: Insufficient documentation

## 2024-01-30 DIAGNOSIS — K2100 Gastro-esophageal reflux disease with esophagitis, without bleeding: Secondary | ICD-10-CM | POA: Diagnosis not present

## 2024-01-30 MED ORDER — DOXYCYCLINE HYCLATE 100 MG PO TABS: 100.0000 mg | ORAL_TABLET | Freq: Two times a day (BID) | ORAL | 0 refills | Status: DC

## 2024-01-30 MED ORDER — PREDNISONE 20 MG PO TABS: ORAL_TABLET | ORAL | 0 refills | Status: AC

## 2024-01-30 NOTE — Assessment & Plan Note (Signed)
 Controlled Denies any worsening or changing in blood pressure Continue to monitor Continue metoprolol  100mg  as prescribed Will adjust treatment based on readings BP Readings from Last 3 Encounters:  01/30/24 138/80  10/27/23 138/82  06/13/23 (!) 154/93

## 2024-01-30 NOTE — Assessment & Plan Note (Signed)
 Chronic GERD with persistent symptoms despite Protonix  and famotidine, suggestive of esophagitis. Voquezna recommended for multi-target acid reduction. - Provide Voquezna samples, start with 20 mg for erosive esophagitis, then reduce to 10 mg for maintenance. - Ensure GI referral is active and follow up with GI for endoscopy. - If no contact from GI by Thursday, follow up to ensure referral is processed.

## 2024-01-30 NOTE — Assessment & Plan Note (Signed)
 Chronic sinus pressure and fluid in the ear suggestive of sinusitis and eustachian tube dysfunction. Azithromycin ineffective; doxycycline  effective previously. - Prescribe doxycycline  for sinusitis. - Consider prednisone  to reduce sinus inflammation, with caution regarding potential side effects. - Monitor for fever or worsening symptoms and report if she occurs.

## 2024-01-30 NOTE — Patient Instructions (Signed)
 VISIT SUMMARY:  During today's visit, we discussed your ongoing issues with reflux, difficulty swallowing, blood in your stool, and sinus problems. We reviewed your current medications and symptoms, and made adjustments to your treatment plan to better manage your conditions.  YOUR PLAN:  -GASTROESOPHAGEAL REFLUX DISEASE (GERD) WITH ESOPHAGITIS: GERD is a condition where stomach acid frequently flows back into the tube connecting your mouth and stomach, causing irritation. We are starting you on Voquezna to help reduce acid and manage your symptoms. Begin with 20 mg for erosive esophagitis, then reduce to 10 mg for maintenance. Ensure your GI referral is active and follow up with them for an endoscopy. If you do not hear from them by Thursday, please follow up to ensure the referral is processed.  -BLOOD IN STOOL WITH POSSIBLE INTERNAL HEMORRHOIDS: The presence of blood in your stool and a sensation of rectal fullness may indicate internal hemorrhoids. We have made a GI referral for further evaluation, including a colonoscopy and potential treatment for hemorrhoids. Ensure your GI referral is active and follow up with them.  -SINUSITIS WITH EUSTACHIAN TUBE DYSFUNCTION: Sinusitis is an inflammation of the sinuses, and eustachian tube dysfunction can cause fluid buildup in the ear. We are prescribing doxycycline  to treat your sinusitis. We may also consider prednisone  to reduce sinus inflammation, but be aware of potential side effects. Monitor for fever or worsening symptoms and report them if they occur.  -GENERAL HEALTH MAINTENANCE: Your slightly elevated cholesterol can be managed with diet and exercise. Continue with your current lifestyle changes and plan for lab work before your next chronic follow-up visit in January.  INSTRUCTIONS:  Please follow up in six months to assess your response to the new GERD medication and ensure your GI referral is processed. Schedule lab work a few days before  your next chronic follow-up visit.

## 2024-01-30 NOTE — Progress Notes (Signed)
 Subjective:  Patient ID: Andrea Owens, female    DOB: 1975/12/08  Age: 48 y.o. MRN: [id number]  Chief Complaint  Patient presents with   Medical Management of Chronic Issues   Patient presents today for management of chronic issues. Patient also states she is still seeing blood stool on occasion. Patient also has concerns for swalloowing. Patient states she feels its closing up especially at night when sleeping and has issues swallowing pills.  HPI:  Discussed the use of AI scribe software for clinical note transcription with the patient, who gave verbal consent to proceed.  History of Present Illness   Andrea Owens is a 48 year old female with chronic reflux and allergies who presents for a follow-up visit.  She experiences ongoing issues with reflux and difficulty swallowing, particularly with larger pills like turmeric. There is a sensation of pills getting stuck in her throat, necessitating repeated swallowing to move them down. At night, she sometimes feels her throat 'closing up', which disrupts her sleep. She has been taking Protonix  and famotidine but ran out of Protonix  a couple of weeks ago and is unsure if it made a significant difference.  She experiences blood in her stool occasionally, ranging from once to three times a week, and describes her bowel movements as soft but regular. She sometimes feels rectal pressure, described as fullness, but denies any rectal pain.  She has been experiencing fluid in her ear for a couple of months, which sometimes causes sharp pain and is associated with sinus pressure, particularly in the lower sinuses. Bending over can make her feel the fluid moving around. No coughing is associated with this issue. She has a history of allergies and is allergic to penicillins. Doxycycline  has been effective for sinus infections in the past, while a Z-Pak was not effective. No drainage is associated with her allergies, but she reports sinus pressure and  occasional sharp ear pain.           01/30/2024    9:09 AM 10/27/2023   10:53 AM  Depression screen PHQ 2/9  Decreased Interest 0 0  Down, Depressed, Hopeless 0 0  PHQ - 2 Score 0 0  Altered sleeping 0   Tired, decreased energy 0   Change in appetite 0   Feeling bad or failure about yourself  0   Trouble concentrating 0   Moving slowly or fidgety/restless 0   Suicidal thoughts 0   PHQ-9 Score 0   Difficult doing work/chores Not difficult at all         01/30/2024    9:09 AM  Fall Risk   Falls in the past year? 0  Number falls in past yr: 0  Injury with Fall? 0  Risk for fall due to : No Fall Risks  Follow up Falls evaluation completed    Patient Care Team: Milon Cleaves, [place] as PCP - General (Physician Assistant) Albertus, Gordy HERO, MD as Consulting Physician (Gastroenterology) Marget Lenis, MD as Consulting Physician (Obstetrics and Gynecology)   Review of Systems  Constitutional:  Negative for chills, diaphoresis, fatigue and fever.  HENT:  Negative for congestion, ear pain and sinus pain.   Eyes: Negative.   Respiratory:  Negative for cough and shortness of breath.   Cardiovascular:  Negative for chest pain.  Gastrointestinal:  Negative for abdominal pain, constipation, diarrhea, nausea and vomiting.  Endocrine: Negative.   Genitourinary:  Negative for dysuria, frequency and urgency.  Musculoskeletal:  Negative for arthralgias.  Skin: Negative.  Allergic/Immunologic: Negative.   Neurological:  Negative for weakness and headaches.  Hematological: Negative.   Psychiatric/Behavioral:  Negative for dysphoric mood. The patient is not nervous/anxious.     Current Outpatient Medications on File Prior to Visit  Medication Sig Dispense Refill   buPROPion  (WELLBUTRIN  XL) 300 MG 24 hr tablet TAKE 1 TABLET BY MOUTH  EVERY MORNING FOR MOOD,  FOCUS AND CONCENTRATION 90 tablet 3   cholecalciferol (VITAMIN D ) 1000 UNITS tablet Take 1,000 Units by mouth daily.     Cyanocobalamin  (VITAMIN B 12 PO) Take 1 tablet by mouth daily.     cyclobenzaprine  (FLEXERIL ) 5 MG tablet Take 1 tablet (5 mg total) by mouth 3 (three) times daily as needed for muscle spasms. 60 tablet 0   dicyclomine  (BENTYL ) 20 MG tablet Take 1 tablet (20 mg total) by mouth 4 (four) times daily -  before meals and at bedtime. 90 tablet 3   estradiol  (DOTTI ) 0.1 MG/24HR patch Place 1 patch onto the skin 2 (two) times a week.     famotidine (PEPCID) 40 MG tablet Take 40 mg by mouth daily as needed for heartburn or indigestion.     fluticasone  (FLONASE ) 50 MCG/ACT nasal spray Place 2 sprays into both nostrils at bedtime. (Patient taking differently: Place 2 sprays into both nostrils daily as needed for allergies.) 16 g 1   loratadine (CLARITIN) 10 MG tablet Take 10 mg by mouth daily.     Magnesium 250 MG TABS Take 250 mg by mouth daily.     metoprolol  tartrate (LOPRESSOR ) 100 MG tablet Take 1 tablet (100 mg total) by mouth 2 (two) times daily. 180 tablet 2   minoxidil  (LONITEN ) 10 MG tablet TAKE 1/2 TABLET BY MOUTH DAILY IN THE MORNING FOR BLOOD PRESSURE 45 tablet 3   montelukast  (SINGULAIR ) 10 MG tablet TAKE 1 TABLET BY MOUTH DAILY 90 tablet 3   olmesartan -hydrochlorothiazide  (BENICAR  HCT) 40-25 MG tablet Take 1 tablet by mouth daily. 90 tablet 3   pantoprazole  (PROTONIX ) 40 MG tablet Take 1 tablet (40 mg total) by mouth daily. 30 tablet 0   rosuvastatin  (CRESTOR ) 10 MG tablet Take  1 tablet  Daily for Cholesterol        /       TAKE      BY      MOUTH 90 tablet 3   TURMERIC PO Take 500 mg by mouth daily.     No current facility-administered medications on file prior to visit.   Past Medical History:  Diagnosis Date   Allergy    Dyspnea    Endometriosis    GERD (gastroesophageal reflux disease)    Hyperlipidemia    Hypertension    Pneumonia    Prediabetes    Vitamin D  deficiency    Past Surgical History:  Procedure Laterality Date   ABDOMINAL HYSTERECTOMY  03/2015   Dr. Marget   CHOLECYSTECTOMY N/A  06/13/2023   Procedure: LAPAROSCOPIC CHOLECYSTECTOMY W/ ICG DYE;  Surgeon: Tanda Locus, MD;  Location: [facility];  Service: General;  Laterality: N/A;  ICG DYE   EYE SURGERY     GALLBLADDER SURGERY  06/13/2023   REFRACTIVE SURGERY  07/12/2002    Family History  Problem Relation Age of Onset   Hyperlipidemia Mother    Irritable bowel syndrome Mother    Arthritis Mother    COPD Mother    Heart disease Father    Hyperlipidemia Father    Hypertension Father    Cancer Father  COPD Father    Hearing loss Father    Breast cancer Maternal Aunt        Great Aunt   Prostate cancer Maternal Grandfather    Colon cancer Neg Hx    Esophageal cancer Neg Hx    Stomach cancer Neg Hx    Rectal cancer Neg Hx    Social History   Socioeconomic History   Marital status: Married    Spouse name: Not on file   Number of children: 0   Years of education: Not on file   Highest education level: 12th grade  Occupational History   Occupation: Production designer, theatre/television/film in Training and development officer: [employer]  Tobacco Use   Smoking status: Every Day    Current packs/day: 0.50    Average packs/day: 0.5 packs/day for 20.0 years (10.0 ttl pk-yrs)    Types: Cigarettes   Smokeless tobacco: Never  Vaping Use   Vaping status: Never Used  Substance and Sexual Activity   Alcohol use: Yes    Comment: social   Drug use: No   Sexual activity: Not on file    Comment: on period  Other Topics Concern   Not on file  Social History Narrative   Not on file   Social Drivers of Health   Financial Resource Strain: Low Risk  (01/26/2024)   Overall Financial Resource Strain (CARDIA)    Difficulty of Paying Living Expenses: Not hard at all  Food Insecurity: No Food Insecurity (01/26/2024)   Hunger Vital Sign    Worried About Running Out of Food in the Last Year: Never true    Ran Out of Food in the Last Year: Never true  Transportation Needs: No Transportation Needs (01/26/2024)   PRAPARE - Scientist, research (physical sciences) (Medical): No    Lack of Transportation (Non-Medical): No  Physical Activity: Insufficiently Active (01/26/2024)   Exercise Vital Sign    Days of Exercise per Week: 3 days    Minutes of Exercise per Session: 30 min  Stress: No Stress Concern Present (01/26/2024)   [employer] of Occupational Health - Occupational Stress Questionnaire    Feeling of Stress: Not at all  Social Connections: Moderately Integrated (01/26/2024)   Social Connection and Isolation Panel    Frequency of Communication with Friends and Family: More than three times a week    Frequency of Social Gatherings with Friends and Family: Twice a week    Attends Religious Services: More than 4 times per year    Active Member of [employer] or Organizations: No    Attends Engineer, structural: Not on file    Marital Status: Married    Objective:  BP 138/80   Pulse 60   Temp 98.2 F (36.8 C) (Temporal)   Resp 18   Ht 5' 3.5 (1.613 m)   Wt 165 lb 6.4 oz (75 kg)   LMP 03/15/2015   SpO2 98%   BMI 28.84 kg/m      01/30/2024    9:06 AM 10/27/2023   10:50 AM 06/13/2023    3:39 PM  BP/Weight  Systolic BP 138 138 154  Diastolic BP 80 82 93  Wt. (Lbs) 165.4 165.2   BMI 28.84 kg/m2 28.36 kg/m2     Physical Exam Vitals reviewed.  Constitutional:      Appearance: Normal appearance.  Neck:     Vascular: No carotid bruit.  Cardiovascular:     Rate and Rhythm: Normal rate and regular rhythm.  Heart sounds: Normal heart sounds.  Pulmonary:     Effort: Pulmonary effort is normal.     Breath sounds: Normal breath sounds.  Abdominal:     General: Bowel sounds are normal.     Palpations: Abdomen is soft.     Tenderness: There is no abdominal tenderness.  Neurological:     Mental Status: She is alert and oriented to person, place, and time.  Psychiatric:        Mood and Affect: Mood normal.        Behavior: Behavior normal.         Lab Results  Component Value Date   WBC 7.7  10/27/2023   HGB 14.3 10/27/2023   HCT 43.0 10/27/2023   PLT 168 10/27/2023   GLUCOSE 83 10/27/2023   CHOL 196 10/27/2023   TRIG 144 10/27/2023   HDL 48 10/27/2023   LDLCALC 122 (H) 10/27/2023   ALT 25 10/27/2023   AST 25 10/27/2023   NA 142 10/27/2023   K 4.6 10/27/2023   CL 103 10/27/2023   CREATININE 0.89 10/27/2023   BUN 16 10/27/2023   CO2 28 10/27/2023   TSH 1.060 10/27/2023   HGBA1C 5.6 10/27/2023   MICROALBUR <0.2 10/14/2022      Assessment & Plan:  Acute recurrent frontal sinusitis Assessment & Plan: Chronic sinus pressure and fluid in the ear suggestive of sinusitis and eustachian tube dysfunction. Azithromycin ineffective; doxycycline  effective previously. - Prescribe doxycycline  for sinusitis. - Consider prednisone  to reduce sinus inflammation, with caution regarding potential side effects. - Monitor for fever or worsening symptoms and report if she occurs.  Orders: -     Doxycycline  Hyclate; Take 1 tablet (100 mg total) by mouth 2 (two) times daily.  Dispense: 14 tablet; Refill: 0 -     predniSONE ; Take 3 tablets (60 mg total) by mouth daily with breakfast for 3 days, THEN 2 tablets (40 mg total) daily with breakfast for 3 days, THEN 1 tablet (20 mg total) daily with breakfast for 3 days.  Dispense: 18 tablet; Refill: 0  Bloody stool Assessment & Plan: Intermittent hematochezia and sensation of rectal fullness suggestive of internal hemorrhoids. GI referral pending for further evaluation. - Ensure GI referral is active and follow up with GI for colonoscopy and potential hemorrhoid treatment.   Gastroesophageal reflux disease with esophagitis without hemorrhage Assessment & Plan: Chronic GERD with persistent symptoms despite Protonix  and famotidine, suggestive of esophagitis. Voquezna recommended for multi-target acid reduction. - Provide Voquezna samples, start with 20 mg for erosive esophagitis, then reduce to 10 mg for maintenance. - Ensure GI referral is  active and follow up with GI for endoscopy. - If no contact from GI by Thursday, follow up to ensure referral is processed.   Primary hypertension Assessment & Plan: Controlled Denies any worsening or changing in blood pressure Continue to monitor Continue metoprolol  100mg  as prescribed Will adjust treatment based on readings BP Readings from Last 3 Encounters:  01/30/24 138/80  10/27/23 138/82  06/13/23 (!) 154/93        General Health Maintenance Slightly elevated cholesterol managed with diet and exercise. Annual work-related health screening required. - Encourage diet and exercise to manage cholesterol. - Plan for lab work prior to next chronic follow-up visit in January.  Follow-up Follow-up needed to assess response to new GERD medication and ensure GI referral is processed. - Schedule chronic follow-up in six months. - Schedule lab work a few days before the next chronic follow-up visit.  Meds ordered this encounter  Medications   doxycycline  (VIBRA -TABS) 100 MG tablet    Sig: Take 1 tablet (100 mg total) by mouth 2 (two) times daily.    Dispense:  14 tablet    Refill:  0   predniSONE  (DELTASONE ) 20 MG tablet    Sig: Take 3 tablets (60 mg total) by mouth daily with breakfast for 3 days, THEN 2 tablets (40 mg total) daily with breakfast for 3 days, THEN 1 tablet (20 mg total) daily with breakfast for 3 days.    Dispense:  18 tablet    Refill:  0    No orders of the defined types were placed in this encounter.    Follow-up: Return in about 6 months (around 08/01/2024) for Chronic, Nola, lab visit.   I,Angela Taylor,acting as a Neurosurgeon for [employer], PA.,have documented all relevant documentation on the behalf of Nola Angles, PA,as directed by  Nola Angles, PA while in the presence of Nola Angles, [place].   An After Visit Summary was printed and given to the patient.  Nola Angles, [place] [facility] [phone]

## 2024-01-30 NOTE — Assessment & Plan Note (Signed)
 Intermittent hematochezia and sensation of rectal fullness suggestive of internal hemorrhoids. GI referral pending for further evaluation. - Ensure GI referral is active and follow up with GI for colonoscopy and potential hemorrhoid treatment.

## 2024-03-11 ENCOUNTER — Other Ambulatory Visit: Payer: Self-pay | Admitting: Nurse Practitioner

## 2024-03-11 DIAGNOSIS — I10 Essential (primary) hypertension: Secondary | ICD-10-CM

## 2024-03-23 ENCOUNTER — Ambulatory Visit: Admitting: Nurse Practitioner

## 2024-03-23 ENCOUNTER — Encounter: Payer: Self-pay | Admitting: Nurse Practitioner

## 2024-03-23 ENCOUNTER — Other Ambulatory Visit

## 2024-03-23 VITALS — BP 140/88 | HR 64 | Ht 64.0 in | Wt 169.5 lb

## 2024-03-23 DIAGNOSIS — K529 Noninfective gastroenteritis and colitis, unspecified: Secondary | ICD-10-CM

## 2024-03-23 DIAGNOSIS — R197 Diarrhea, unspecified: Secondary | ICD-10-CM | POA: Diagnosis not present

## 2024-03-23 DIAGNOSIS — K625 Hemorrhage of anus and rectum: Secondary | ICD-10-CM | POA: Diagnosis not present

## 2024-03-23 DIAGNOSIS — R110 Nausea: Secondary | ICD-10-CM | POA: Diagnosis not present

## 2024-03-23 DIAGNOSIS — Z9049 Acquired absence of other specified parts of digestive tract: Secondary | ICD-10-CM | POA: Diagnosis not present

## 2024-03-23 DIAGNOSIS — K219 Gastro-esophageal reflux disease without esophagitis: Secondary | ICD-10-CM | POA: Diagnosis not present

## 2024-03-23 MED ORDER — HYDROCORTISONE ACETATE 25 MG RE SUPP: 25.0000 mg | Freq: Every evening | RECTAL | 0 refills | Status: AC

## 2024-03-23 MED ORDER — FAMOTIDINE 20 MG PO TABS: 20.0000 mg | ORAL_TABLET | Freq: Every day | ORAL | 1 refills | Status: AC

## 2024-03-23 MED ORDER — CHOLESTYRAMINE 4 G PO PACK: PACK | ORAL | 1 refills | Status: AC

## 2024-03-23 MED ORDER — NA SULFATE-K SULFATE-MG SULF 17.5-3.13-1.6 GM/177ML PO SOLN
1.0000 | ORAL | 0 refills | Status: DC
Start: 2024-03-23 — End: 2024-04-16

## 2024-03-23 NOTE — Progress Notes (Signed)
 03/23/2024 [name] [id number] 24-Dec-1975   CHIEF COMPLAINT: Acid reflux, difficulty swallowing pills, rectal pressure and rectal bleeding, diarrhea  HISTORY OF PRESENT ILLNESS: Andrea Owens is a 48 year old female with a past medical history of hypertension, hyperlipidemia, prediabetes, vitamin D  deficiency, H. pylori gastritis and questionable remote history of ulcerative colitis. Past cholecystectomy 06/2023 and hysterectomy 2016. She presents to our office today as referred by Andrea Angles PA-C for further evaluation regarding GERD. She endorses having active reflux symptoms and often awakens at nighttime offing and feeling choked. She has difficulty swallowing pills and sometimes has difficulty swallowing solid foods such as chicken, broccoli and potato chips. Food gets stuck to the throat/upper esophagus, she drinks water and the stuck food goes down it does not come back up. She took Pantoprazole  in the past which she stopped taking and is currently taking Famotidine  40 mg daily. She also has nausea without vomiting for the past 6 months. Nausea is present upon awakening in the morning and improves after she eat saltine crackers. She was prescribed a 30-day course of Doxycycline  by her PCP, why it was prescribed is unclear but her nausea did not worsen after taking it. She has chronic diarrhea since she underwent a cholecystectomy. She describes passing several loose stools daily. She has passed 3 solid stools since her cholecystectomy. She saw darker red blood mixed with her stool Feb 27, 2025and passed on and off bright red blood per the rectum since April 2025 with associated rectal pressure and pain. She sometimes feels she needs to pass a bowel movement but cannot. She sometimes passes nonbloody mucus per the rectum.  She stated being diagnosed with ulcerative colitis per a flexible sigmoidoscopy at [facility] in 2000 and which did not require treatment, these records are not  available in care everywhere.  She reported completing a Cologuard 10/2021 which was negative.  Mother with history of colon polyps.  No known family history of IBD or colorectal cancer.  She underwent an EGD by Dr. Albertus 09/14/2013 which identified H. pylori gastritis which was treated with Pylera and PPI twice daily.  No NSAID use.     Latest Ref Rng & Units 10/27/2023   11:54 AM 05/31/2023    8:04 AM 03/09/2023   10:07 AM  CBC  WBC 3.4 - 10.8 x10E3/uL 7.7  7.6  9.4   Hemoglobin 11.1 - 15.9 g/dL 85.6  86.0  86.2   Hematocrit 34.0 - 46.6 % 43.0  40.8  40.8   Platelets 150 - 450 x10E3/uL 168  152  168        Latest Ref Rng & Units 10/27/2023   11:54 AM 05/31/2023    8:04 AM 03/09/2023   10:07 AM  CMP  Glucose 70 - 99 mg/dL 83  87  57   BUN 6 - 24 mg/dL 16  17  14    Creatinine 0.57 - 1.00 mg/dL 9.10  9.32  9.32   Sodium 134 - 144 mmol/L 142  140  143   Potassium 3.5 - 5.2 mmol/L 4.6  4.1  4.2   Chloride 96 - 106 mmol/L 103  106  105   CO2 20 - 29 mmol/L 28  27  30    Calcium  8.7 - 10.2 mg/dL 89.7  9.2  9.8   Total Protein 6.0 - 8.5 g/dL 6.7  6.8  6.7   Total Bilirubin 0.0 - 1.2 mg/dL 0.2  0.6  0.4   Alkaline Phos  44 - 121 IU/L 87  56    AST 0 - 40 IU/L 25  29  23    ALT 0 - 32 IU/L 25  29  23      EGD 09/14/2013: The mucosa of the esophagus appeared normal, biopsies obtained The mucosa of the stomach appeared normal, biopsies obtained The duodenal mucosa was normal, biopsies obtained 1. Surgical [P], endoscopy, biopsy site, duodenal - BENIGN SMALL BOWEL MUCOSA. NO VILLOUS ATROPHY, INFLAMMATION OR OTHER ABNORMALITIES PRESENT. 2. Surgical [P], endoscopy, biopsy site, gastric antrum - HELICOBACTER PYLORI GASTRITIS, NO EVIDENCE OF INTESTINAL METAPLASIA, DYSPLASIA OR MALIGNANCY.  Past Medical History:  Diagnosis Date   Allergy    Dyspnea    Endometriosis    GERD (gastroesophageal reflux disease)    Hyperlipidemia    Hypertension    Pneumonia    Prediabetes    Vitamin D   deficiency    Past Surgical History:  Procedure Laterality Date   ABDOMINAL HYSTERECTOMY  03/2015   Dr. Marget   CHOLECYSTECTOMY N/A 06/13/2023   Procedure: LAPAROSCOPIC CHOLECYSTECTOMY W/ ICG DYE;  Surgeon: Tanda Locus, MD;  Location: [facility];  Service: General;  Laterality: N/A;  ICG DYE   EYE SURGERY     GALLBLADDER SURGERY  06/13/2023   REFRACTIVE SURGERY  07/12/2002   Social History: She is married.  She is a Dentist.  She smokes cigarettes.  She drinks 1-2 alcohol drinks daily.  No drug use.    Family History: family history includes Arthritis in her mother; Breast cancer in her maternal aunt; COPD in her father and mother; Cancer in her father; Hearing loss in her father; Heart disease in her father; Hyperlipidemia in her father and mother; Hypertension in her father; Irritable bowel syndrome in her mother; Prostate cancer in her maternal grandfather.  Allergies  Allergen Reactions   Penicillins Rash and Dermatitis      Outpatient Encounter Medications as of 03/23/2024  Medication Sig   buPROPion  (WELLBUTRIN  XL) 300 MG 24 hr tablet TAKE 1 TABLET BY MOUTH  EVERY MORNING FOR MOOD,  FOCUS AND CONCENTRATION   cholecalciferol (VITAMIN D ) 1000 UNITS tablet Take 1,000 Units by mouth daily.   Cyanocobalamin (VITAMIN B 12 PO) Take 1 tablet by mouth daily.   cyclobenzaprine  (FLEXERIL ) 5 MG tablet Take 1 tablet (5 mg total) by mouth 3 (three) times daily as needed for muscle spasms.   dicyclomine  (BENTYL ) 20 MG tablet Take 1 tablet (20 mg total) by mouth 4 (four) times daily -  before meals and at bedtime.   doxycycline  (VIBRA -TABS) 100 MG tablet Take 1 tablet (100 mg total) by mouth 2 (two) times daily.   estradiol  (DOTTI ) 0.1 MG/24HR patch Place 1 patch onto the skin 2 (two) times a week.   famotidine  (PEPCID ) 40 MG tablet Take 40 mg by mouth daily as needed for heartburn or indigestion.   fluticasone  (FLONASE ) 50 MCG/ACT nasal spray Place 2 sprays into both nostrils at  bedtime. (Patient taking differently: Place 2 sprays into both nostrils daily as needed for allergies.)   loratadine (CLARITIN) 10 MG tablet Take 10 mg by mouth daily.   Magnesium 250 MG TABS Take 250 mg by mouth daily.   metoprolol  tartrate (LOPRESSOR ) 100 MG tablet Take 1 tablet (100 mg total) by mouth 2 (two) times daily.   minoxidil  (LONITEN ) 10 MG tablet TAKE 1/2 TABLET BY MOUTH DAILY IN THE MORNING FOR BLOOD PRESSURE   montelukast  (SINGULAIR ) 10 MG tablet TAKE 1 TABLET BY MOUTH DAILY  olmesartan -hydrochlorothiazide  (BENICAR  HCT) 40-25 MG tablet Take 1 tablet by mouth daily.   pantoprazole  (PROTONIX ) 40 MG tablet Take 1 tablet (40 mg total) by mouth daily.   rosuvastatin  (CRESTOR ) 10 MG tablet Take  1 tablet  Daily for Cholesterol        /       TAKE      BY      MOUTH   TURMERIC PO Take 500 mg by mouth daily.   No facility-administered encounter medications on file as of 03/23/2024.   REVIEW OF SYSTEMS:  Gen: + Night sweats. No fevers or weight loss.  CV: Denies chest pain, palpitations or edema. Resp: + Cough, no shortness of breath of hemoptysis.  GI: See HPI. GU: Denies urinary burning, blood in urine, increased urinary frequency or incontinence. MS:+ Back pain. Derm: Denies rash, itchiness, skin lesions or unhealing ulcers. Psych: Denies depression, anxiety, memory loss or confusion. Heme: Denies bruising, easy bleeding. Neuro:  Denies headaches, dizziness or paresthesias. Endo:  Denies any problems with DM, thyroid  or adrenal function.  PHYSICAL EXAM: LMP 03/15/2015  BP (!) 140/88 (BP Location: Left Arm, Patient Position: Sitting, Cuff Size: Normal)   Pulse 64   Ht 5' 4 (1.626 m) Comment: height measured without shoes  Wt 169 lb 8 oz (76.9 kg)   LMP 03/15/2015   BMI 29.09 kg/m  General: 48 year old female in no acute distress. Head: Normocephalic and atraumatic. Eyes:  Sclerae non-icteric, conjunctive pink. Ears: Normal auditory acuity. Mouth: Dentition intact. No  ulcers or lesions.  Neck: Supple, no lymphadenopathy or thyromegaly.  Lungs: Clear bilaterally to auscultation without wheezes, crackles or rhonchi. Heart: Regular rate and rhythm. No murmur, rub or gallop appreciated.  Abdomen: Soft, nondistended.  Mild central lower abdominal tenderness without rebound or guarding.  No masses. No hepatosplenomegaly. Normoactive bowel sounds x 4 quadrants.  Rectal: Noninflamed nonbleeding external hemorrhoids, rectal mucosa felt edematous/boggy without palpable mass.  Small internal hemorrhoids without prolapse. Anoscopy deferred as patient will proceed with a full diagnostic colonoscopy. Sophia CMA present during exam. Musculoskeletal: Symmetrical with no gross deformities. Skin: Warm and dry. No rash or lesions on visible extremities. Extremities: No edema. Neurological: Alert oriented x 4, no focal deficits.  Psychological: Alert and cooperative. Normal mood and affect.  ASSESSMENT AND PLAN:  48 year old female with GERD, worsening nighttime reflux symptoms with dysphagia involving pills and solid foods.  - EGD with possible esophageal dilatation benefits and risks discussed including risk with sedation, risk of bleeding, perforation and infection  - GERD diet - Pantoprazole  40 mg once daily to be taken 30 minutes before breakfast  History of H. Pylori gastritis, treated with Pylera and PPI bid in 2015  Chronic post cholecystectomy diarrhea. S/P CCY 08/2022.  - Cholestyramine  4 g packet mixed in 4 ounces of clear liquid of choice once daily not to be taken within 4 hours of any other medication.  Stop if no BM in 24 hours.   Chronic diarrhea with dark red bloody stools x 1 episode 08/2023 and intermittent bright red blood per the rectum since 10/2023 with rectal pain/pressure +/- tenesmus. Questionable remote history of ulcerative colitis. Patient endorsed undergoing a flexible sigmoidoscopy in [place] in 2000 which identified ulcerative colitis did not  require treatment. Completed a course of Doxycycline  12/2023, diarrhea did not worsen after taking it.  - C. Diff PCR - Will check fecal calprotectin level C. difficile negative - CBC, CMP, CRP, sed rate - Diagnostic colonoscopy to rule  out IBD and colorectal neoplasm,  benefits and risks discussed including risk with sedation, risk of bleeding, perforation and infection  - Anusol  suppository 1 PR nightly x 5 nights  Nausea x 6 months - Patient declines need for antiemetic - Labs and EGD as ordered above - Patient to contact office if nausea worsens   Today's encounter was 45 minutes which included precharting, chart/result review, history/exam, face-to-face time used for counseling, formulating a treatment plan with follow-up and documentation.         CC:  Milon Cleaves, [place]

## 2024-03-23 NOTE — Patient Instructions (Signed)
 We have sent the following medications to your pharmacy for you to pick up at your convenience: Suprep   Cholyestramine ( hold if no bowel movement in 24 hrs.) See additional instructions on prescription.   Famotidine  20mg  - 1 tablet at bedtime as needed.   Anusol  HC suppositories - Use 1 suppository nightly for 5 nights.   Your provider has requested that you go to the basement level for lab work before leaving today. Press B on the elevator. The lab is located at the first door on the left as you exit the elevator.  You have been scheduled for an endoscopy and colonoscopy. Please follow the written instructions given to you at your visit today.  If you use inhalers (even only as needed), please bring them with you on the day of your procedure.  DO NOT TAKE 7 DAYS PRIOR TO TEST- Trulicity (dulaglutide) Ozempic, Wegovy (semaglutide) Mounjaro (tirzepatide) Bydureon Bcise (exanatide extended release)  DO NOT TAKE 1 DAY PRIOR TO YOUR TEST Rybelsus (semaglutide) Adlyxin (lixisenatide) Victoza (liraglutide) Byetta (exanatide) ___________________________________________________________________________  _______________________________________________________  If your blood pressure at your visit was 140/90 or greater, please contact your primary care physician to follow up on this.  _______________________________________________________  If you are age 39 or older, your body mass index should be between 23-30. Your Body mass index is 29.09 kg/m. If this is out of the aforementioned range listed, please consider follow up with your Primary Care Provider.  If you are age 77 or younger, your body mass index should be between 19-25. Your Body mass index is 29.09 kg/m. If this is out of the aformentioned range listed, please consider follow up with your Primary Care Provider.   ________________________________________________________  The [facility] providers would like to encourage  you to use MYCHART to communicate with providers for non-urgent requests or questions.  Due to long hold times on the telephone, sending your provider a message by [facility] may be a faster and more efficient way to get a response.  Please allow 48 business hours for a response.  Please remember that this is for non-urgent requests.  _______________________________________________________  [facility] is using a team-based approach to care.  Your team is made up of your doctor and two to three APPS. Our APPS (Nurse Practitioners and Physician Assistants) work with your physician to ensure care continuity for you. They are fully qualified to address your health concerns and develop a treatment plan. They communicate directly with your gastroenterologist to care for you. Seeing the Advanced Practice Practitioners on your physician's team can help you by facilitating care more promptly, often allowing for earlier appointments, access to diagnostic testing, procedures, and other specialty referrals.   Due to recent changes in healthcare laws, you may see the results of your imaging and laboratory studies on MyChart before your provider has had a chance to review them.  We understand that in some cases there may be results that are confusing or concerning to you. Not all laboratory results come back in the same time frame and the provider may be waiting for multiple results in order to interpret others.  Please give us  48 hours in order for your provider to thoroughly review all the results before contacting the office for clarification of your results.   Thank you for choosing me and  Gastroenterology.  Elida Shawl, CRNP

## 2024-03-25 ENCOUNTER — Ambulatory Visit: Payer: Self-pay | Admitting: Nurse Practitioner

## 2024-03-25 DIAGNOSIS — R197 Diarrhea, unspecified: Secondary | ICD-10-CM

## 2024-03-28 LAB — SEDIMENTATION RATE: Sed Rate: 7 mm/h (ref 0–40)

## 2024-03-28 LAB — COMPREHENSIVE METABOLIC PANEL WITH GFR
ALT: 21 IU/L (ref 0–32)
AST: 30 IU/L (ref 0–40)
Albumin: 4.1 g/dL (ref 3.9–4.9)
Alkaline Phosphatase: 74 IU/L (ref 44–121)
BUN/Creatinine Ratio: 15 (ref 9–23)
BUN: 9 mg/dL (ref 6–24)
Bilirubin Total: <0.2 mg/dL (ref 0.0–1.2)
CO2: 19 mmol/L — ABNORMAL LOW (ref 20–29)
Calcium: 9.2 mg/dL (ref 8.7–10.2)
Chloride: 104 mmol/L (ref 96–106)
Creatinine, Ser: 0.62 mg/dL (ref 0.57–1.00)
Globulin, Total: 2.7 g/dL (ref 1.5–4.5)
Glucose: 57 mg/dL — ABNORMAL LOW (ref 70–99)
Potassium: 3.9 mmol/L (ref 3.5–5.2)
Sodium: 141 mmol/L (ref 134–144)
Total Protein: 6.8 g/dL (ref 6.0–8.5)
eGFR: 108 mL/min/1.73 (ref >59)

## 2024-03-28 LAB — CBC WITH DIFFERENTIAL/PLATELET
Basophils Absolute: 0 x10E3/uL (ref 0.0–0.2)
Basos: 0 %
EOS (ABSOLUTE): 0.1 x10E3/uL (ref 0.0–0.4)
Eos: 1 %
Hematocrit: 41 % (ref 34.0–46.6)
Hemoglobin: 13.5 g/dL (ref 11.1–15.9)
Immature Grans (Abs): 0 x10E3/uL (ref 0.0–0.1)
Immature Granulocytes: 0 %
Lymphocytes Absolute: 2.6 x10E3/uL (ref 0.7–3.1)
Lymphs: 30 %
MCH: 33.3 pg — ABNORMAL HIGH (ref 26.6–33.0)
MCHC: 32.9 g/dL (ref 31.5–35.7)
MCV: 101 fL — ABNORMAL HIGH (ref 79–97)
Monocytes Absolute: 0.7 x10E3/uL (ref 0.1–0.9)
Monocytes: 8 %
Neutrophils Absolute: 5.4 x10E3/uL (ref 1.4–7.0)
Neutrophils: 61 %
Platelets: 148 x10E3/uL — ABNORMAL LOW (ref 150–450)
RBC: 4.06 x10E6/uL (ref 3.77–5.28)
RDW: 12.9 % (ref 11.7–15.4)
WBC: 8.9 x10E3/uL (ref 3.4–10.8)

## 2024-03-28 LAB — C-REACTIVE PROTEIN: CRP: 3 mg/L (ref 0–10)

## 2024-03-29 ENCOUNTER — Other Ambulatory Visit

## 2024-03-29 DIAGNOSIS — K625 Hemorrhage of anus and rectum: Secondary | ICD-10-CM | POA: Diagnosis not present

## 2024-03-29 DIAGNOSIS — R110 Nausea: Secondary | ICD-10-CM | POA: Diagnosis not present

## 2024-03-29 DIAGNOSIS — R197 Diarrhea, unspecified: Secondary | ICD-10-CM

## 2024-04-02 LAB — CLOSTRIDIUM DIFFICILE TOXIN B, QUALITATIVE, REAL-TIME PCR: Toxigenic C. Difficile by PCR: NOT DETECTED

## 2024-04-16 ENCOUNTER — Ambulatory Visit (AMBULATORY_SURGERY_CENTER): Admitting: Internal Medicine

## 2024-04-16 ENCOUNTER — Encounter: Payer: Self-pay | Admitting: Internal Medicine

## 2024-04-16 VITALS — BP 142/82 | HR 64 | Temp 97.8°F | Resp 20 | Ht 64.0 in | Wt 169.0 lb

## 2024-04-16 DIAGNOSIS — K2980 Duodenitis without bleeding: Secondary | ICD-10-CM

## 2024-04-16 DIAGNOSIS — K625 Hemorrhage of anus and rectum: Secondary | ICD-10-CM

## 2024-04-16 DIAGNOSIS — Z8619 Personal history of other infectious and parasitic diseases: Secondary | ICD-10-CM

## 2024-04-16 DIAGNOSIS — R1319 Other dysphagia: Secondary | ICD-10-CM

## 2024-04-16 DIAGNOSIS — K219 Gastro-esophageal reflux disease without esophagitis: Secondary | ICD-10-CM | POA: Diagnosis not present

## 2024-04-16 DIAGNOSIS — R110 Nausea: Secondary | ICD-10-CM

## 2024-04-16 DIAGNOSIS — K529 Noninfective gastroenteritis and colitis, unspecified: Secondary | ICD-10-CM

## 2024-04-16 DIAGNOSIS — R1310 Dysphagia, unspecified: Secondary | ICD-10-CM

## 2024-04-16 DIAGNOSIS — K3189 Other diseases of stomach and duodenum: Secondary | ICD-10-CM | POA: Diagnosis not present

## 2024-04-16 MED ORDER — SODIUM CHLORIDE 0.9 % IV SOLN: 500.0000 mL | INTRAVENOUS | Status: DC

## 2024-04-16 NOTE — Patient Instructions (Signed)
 YOU HAD AN ENDOSCOPIC PROCEDURE TODAY AT THE [facility]:   Refer to the procedure report that was given to you for any specific questions about what was found during the examination.  If the procedure report does not answer your questions, please call your gastroenterologist to clarify.  If you requested that your care partner not be given the details of your procedure findings, then the procedure report has been included in a sealed envelope for you to review at your convenience later.  YOU SHOULD EXPECT: Some feelings of bloating in the abdomen. Passage of more gas than usual.  Walking can help get rid of the air that was put into your GI tract during the procedure and reduce the bloating. If you had a lower endoscopy (such as a colonoscopy or flexible sigmoidoscopy) you may notice spotting of blood in your stool or on the toilet paper. If you underwent a bowel prep for your procedure, you may not have a normal bowel movement for a few days.  Please Note:  You might notice some irritation and congestion in your nose or some drainage.  This is from the oxygen used during your procedure.  There is no need for concern and it should clear up in a day or so.  SYMPTOMS TO REPORT IMMEDIATELY:  Following lower endoscopy (colonoscopy or flexible sigmoidoscopy):  Excessive amounts of blood in the stool  Significant tenderness or worsening of abdominal pains  Swelling of the abdomen that is new, acute  Fever of 100F or higher  Following upper endoscopy (EGD)  Vomiting of blood or coffee ground material  New chest pain or pain under the shoulder blades  Painful or persistently difficult swallowing  New shortness of breath  Fever of 100F or higher  Black, tarry-looking stools  Resume previous diet Continue present medications Await pathology results Follow up appt with Elida Shawl, NP in 6 weeks or so  For urgent or emergent issues, a gastroenterologist can be reached at any  hour by calling [phone]. Do not use MyChart messaging for urgent concerns.    DIET:  We do recommend a small meal at first, but then you may proceed to your regular diet.  Drink plenty of fluids but you should avoid alcoholic beverages for 24 hours.  ACTIVITY:  You should plan to take it easy for the rest of today and you should NOT DRIVE or use heavy machinery until tomorrow (because of the sedation medicines used during the test).    FOLLOW UP: Our staff will call the number listed on your records the next business day following your procedure.  We will call around 7:15- 8:00 am to check on you and address any questions or concerns that you may have regarding the information given to you following your procedure. If we do not reach you, we will leave a message.     If any biopsies were taken you will be contacted by phone or by letter within the next 1-3 weeks.  Please call us  at [phone] if you have not heard about the biopsies in 3 weeks.    SIGNATURES/CONFIDENTIALITY: You and/or your care partner have signed paperwork which will be entered into your electronic medical record.  These signatures attest to the fact that that the information above on your After Visit Summary has been reviewed and is understood.  Full responsibility of the confidentiality of this discharge information lies with you and/or your care-partner.

## 2024-04-16 NOTE — Op Note (Signed)
 [facility] Patient Name: Andrea Owens Procedure Date: 04/16/2024 2:06 PM MRN: [id number] Endoscopist: Gordy CHRISTELLA Starch , MD, [id number] Age: 48 Referring MD:  Date of Birth: 1976/02/05 Gender: Female Account #: [id number] Procedure:                Upper GI endoscopy Indications:              Dysphagia, Gastro-esophageal reflux disease,                            Previously treated for Helicobacter pylori (2015),                            Nausea; currently on pantoprazole  40 mg daily Medicines:                Monitored Anesthesia Care Procedure:                Pre-Anesthesia Assessment:                           - Prior to the procedure, a History and Physical                            was performed, and patient medications and                            allergies were reviewed. The patient's tolerance of                            previous anesthesia was also reviewed. The risks                            and benefits of the procedure and the sedation                            options and risks were discussed with the patient.                            All questions were answered, and informed consent                            was obtained. Prior Anticoagulants: The patient has                            taken no anticoagulant or antiplatelet agents. ASA                            Grade Assessment: II - A patient with mild systemic                            disease. After reviewing the risks and benefits,                            the patient was deemed in satisfactory condition to  undergo the procedure.                           After obtaining informed consent, the endoscope was                            passed under direct vision. Throughout the                            procedure, the patient's blood pressure, pulse, and                            oxygen saturations were monitored continuously. The                            Olympus Scope  SN [id number] was introduced through the                            mouth, and advanced to the second part of duodenum.                            The upper GI endoscopy was accomplished without                            difficulty. The patient tolerated the procedure                            well. Scope In: Scope Out: Findings:                 The examined esophagus was normal.                           No endoscopic abnormality was evident in the                            esophagus to explain the patient's complaint of                            dysphagia. It was decided, however, to proceed with                            dilation of the entire esophagus. The scope was                            withdrawn. Dilation was performed with a Maloney                            dilator with mild resistance at 54 Fr.                           The entire examined stomach was normal. Biopsies                            were taken with a cold forceps  for Helicobacter                            pylori testing.                           Scattered moderate inflammation characterized by                            congestion (edema) and erythema was found in the                            duodenal bulb. Biopsies were taken with a cold                            forceps for histology.                           The second portion of the duodenum was normal. Complications:            No immediate complications. Estimated Blood Loss:     Estimated blood loss was minimal. Impression:               - Normal esophagus.                           - No endoscopic esophageal abnormality to explain                            patient's dysphagia. Esophagus dilated with 54 Fr                            Maloney.                           - Normal stomach. Biopsied to exclude recurrent H.                            Pylori infection.                           - Duodenitis. Biopsied.                           -  Normal second portion of the duodenum. Recommendation:           - Patient has a contact number available for                            emergencies. The signs and symptoms of potential                            delayed complications were discussed with the                            patient. Return to normal activities tomorrow.  Written discharge instructions were provided to the                            patient.                           - Resume previous diet.                           - Continue present medications.                           - Await pathology results.                           - See the other procedure note for documentation of                            additional recommendations. Gordy CHRISTELLA Starch, MD 04/16/2024 2:41:49 PM This report has been signed electronically.

## 2024-04-16 NOTE — Progress Notes (Signed)
 Called to room to assist during endoscopic procedure.  Patient ID and intended procedure confirmed with present staff. Received instructions for my participation in the procedure from the performing physician.

## 2024-04-16 NOTE — Op Note (Signed)
 [facility] Patient Name: Andrea Owens Procedure Date: 04/16/2024 2:06 PM MRN: [id number] Endoscopist: Gordy CHRISTELLA Starch , MD, [id number] Age: 48 Referring MD:  Date of Birth: 02-Aug-1975 Gender: Female Account #: [id number] Procedure:                Colonoscopy Indications:              Clinically significant diarrhea of unexplained                            origin, Rectal bleeding (intermittent), remote hx                            of possible UC from flex sigmoidoscopy in 2000 (at                            [facility]) Medicines:                Monitored Anesthesia Care Procedure:                Pre-Anesthesia Assessment:                           - Prior to the procedure, a History and Physical                            was performed, and patient medications and                            allergies were reviewed. The patient's tolerance of                            previous anesthesia was also reviewed. The risks                            and benefits of the procedure and the sedation                            options and risks were discussed with the patient.                            All questions were answered, and informed consent                            was obtained. Prior Anticoagulants: The patient has                            taken no anticoagulant or antiplatelet agents. ASA                            Grade Assessment: II - A patient with mild systemic                            disease. After reviewing the risks and benefits,  the patient was deemed in satisfactory condition to                            undergo the procedure.                           After obtaining informed consent, the colonoscope                            was passed under direct vision. Throughout the                            procedure, the patient's blood pressure, pulse, and                            oxygen saturations were monitored continuously. The                             Olympus Scope SN [id number] was introduced through the                            anus and advanced to the terminal ileum. The                            colonoscopy was performed without difficulty. The                            patient tolerated the procedure well. The quality                            of the bowel preparation was good. The terminal                            ileum, ileocecal valve, appendiceal orifice, and                            rectum were photographed. Scope In: 2:26:36 PM Scope Out: 2:37:41 PM Scope Withdrawal Time: 0 hours 7 minutes 33 seconds  Total Procedure Duration: 0 hours 11 minutes 5 seconds  Findings:                 The digital rectal exam was normal.                           The terminal ileum appeared normal.                           The colon (entire examined portion) appeared                            normal. Biopsies for histology were taken with a                            cold forceps from the right colon and left colon  for evaluation of microscopic colitis.                           The retroflexed view of the distal rectum and anal                            verge was normal and showed no anal or rectal                            abnormalities. Complications:            No immediate complications. Estimated Blood Loss:     Estimated blood loss: none. Impression:               - The examined portion of the ileum was normal.                           - The entire examined colon is normal. Biopsied.                           - The distal rectum and anal verge are normal on                            retroflexion view. Recommendation:           - Patient has a contact number available for                            emergencies. The signs and symptoms of potential                            delayed complications were discussed with the                            patient. Return to normal activities  tomorrow.                            Written discharge instructions were provided to the                            patient.                           - Resume previous diet.                           - Continue present medications.                           - Await pathology results.                           - See EGD report.                           - Follow-up appt with Elida Shawl, NP in  6 weeks (or so).                           - Repeat colonoscopy is recommended. The                            colonoscopy date will be determined after pathology                            results from today's exam become available for                            review. Gordy CHRISTELLA Starch, MD 04/16/2024 2:44:42 PM This report has been signed electronically.

## 2024-04-16 NOTE — Progress Notes (Signed)
 Report to PACU, RN, vss, BBS= Clear.

## 2024-04-16 NOTE — Progress Notes (Signed)
 Pt's states no medical or surgical changes since previsit or office visit.

## 2024-04-16 NOTE — Progress Notes (Signed)
 See office note dated 03/23/2024 for details and current H&P  Patient presenting for upper and lower endoscopy to evaluate GERD, dysphagia symptom, chronic nausea, history of H. pylori gastritis, chronic diarrhea with rectal bleeding  She remains appropriate for upper endoscopy and colonoscopy in the [facility] today

## 2024-04-17 NOTE — Telephone Encounter (Signed)
 No answer on follow-up call. Left VM for pt.

## 2024-04-19 LAB — SURGICAL PATHOLOGY

## 2024-04-20 ENCOUNTER — Ambulatory Visit: Payer: Self-pay | Admitting: Internal Medicine

## 2024-05-29 NOTE — Progress Notes (Signed)
 Andrea Owens                                          MRN: [id number]   05/29/2024   The [facility] Quality Team Specialist reviewed this patient medical record for the purposes of chart review for care gap closure. The following were reviewed: chart review for care gap closure-controlling blood pressure.    [facility]BCI Quality Team

## 2024-06-04 ENCOUNTER — Other Ambulatory Visit: Payer: Self-pay | Admitting: Nurse Practitioner

## 2024-06-04 DIAGNOSIS — Z01419 Encounter for gynecological examination (general) (routine) without abnormal findings: Secondary | ICD-10-CM | POA: Diagnosis not present

## 2024-06-04 DIAGNOSIS — Z9109 Other allergy status, other than to drugs and biological substances: Secondary | ICD-10-CM

## 2024-06-04 DIAGNOSIS — Z1231 Encounter for screening mammogram for malignant neoplasm of breast: Secondary | ICD-10-CM | POA: Diagnosis not present

## 2024-06-04 DIAGNOSIS — Z6828 Body mass index (BMI) 28.0-28.9, adult: Secondary | ICD-10-CM | POA: Diagnosis not present

## 2024-06-05 ENCOUNTER — Ambulatory Visit: Admitting: Nurse Practitioner

## 2024-06-05 ENCOUNTER — Encounter: Payer: Self-pay | Admitting: Nurse Practitioner

## 2024-06-05 VITALS — BP 130/76 | HR 67 | Ht 64.0 in | Wt 168.0 lb

## 2024-06-05 DIAGNOSIS — K219 Gastro-esophageal reflux disease without esophagitis: Secondary | ICD-10-CM

## 2024-06-05 DIAGNOSIS — R1310 Dysphagia, unspecified: Secondary | ICD-10-CM | POA: Diagnosis not present

## 2024-06-05 DIAGNOSIS — R197 Diarrhea, unspecified: Secondary | ICD-10-CM

## 2024-06-05 NOTE — Progress Notes (Addendum)
 06/05/2024 Andrea Owens [id number] 11-12-1975   Chief Complaint: Follow-up after EGD and colonoscopy  History of Present Illness: Andrea Owens is a 48 year old female with a past medical history of hypertension, hyperlipidemia, prediabetes, vitamin D  deficiency, H. pylori gastritis and questionable remote history of ulcerative colitis. Past cholecystectomy 06/2023 and hysterectomy 2016.  She is followed by Dr. Albertus.  I initially saw patient in office 03/23/2024 due to having reflux, dysphagia and diarrhea.  She was prescribed pantoprazole  40 mg daily for her upper GI symptoms and Cholestyramine  for her diarrhea, suspected postcholecystectomy diarrhea.  An EGD and colonoscopy were scheduled.  Discussed the use of AI scribe software for clinical note transcription with the patient, who gave verbal consent to proceed.  History of Present Illness Andrea Owens is a 48 year old female who presents for follow-up after upper endoscopy and colonoscopy.  EGD was completed 04/16/2024 which showed a normal esophagus, the esophagus was empirically dilated and the stomach was normal.  She recalls being told after her procedure that there may have been an infection in the small bowel, but was unsure of the details. She is currently on pantoprazole  40 mg once daily and uses famotidine  two to three times a month, primarily managing symptoms through dietary control.  She experiences occasional difficulty swallowing pills, which sometimes feel stuck in the upper throat area. This occurs without a specific pattern, and she manages it by drinking water to help the pills go down. She has not had a barium swallow study before. No heartburn or trouble swallowing food.  Colonoscopy 04/16/2024 was normal, biopsies of the colon without evidence of microscopic colitis or IBD.   She reports diarrhea at least three times a week, which she attributes to her previous gallbladder removal. She had stopped taking  cholestyramine  after her colonoscopy but noted that it seemed to help with diarrhea when she was taking it. No bloody or black stools. She occasionally takes Tylenol  or ibuprofen for pain but tries to avoid regular use.  She recently had a mammogram and physical, during which blood was found in her urine, and a culture was sent for further analysis.  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     Latest Ref Rng & Units 03/23/2024   11:52 AM 10/27/2023   11:54 AM 05/31/2023    8:04 AM  CBC  WBC 3.4 - 10.8 x10E3/uL 8.9  7.7  7.6   Hemoglobin 11.1 - 15.9 g/dL 86.4  85.6  86.0   Hematocrit 34.0 - 46.6 % 41.0  43.0  40.8   Platelets 150 - 450 x10E3/uL 148  168  152        Latest Ref Rng & Units 03/23/2024   11:52 AM 10/27/2023   11:54 AM 05/31/2023    8:04 AM  CMP  Glucose 70 - 99 mg/dL 57  83  87   BUN 6 - 24 mg/dL 9  16  17    Creatinine 0.57 - 1.00 mg/dL 9.37  9.10  9.32   Sodium 134 - 144 mmol/L 141  142  140   Potassium 3.5 - 5.2 mmol/L 3.9  4.6  4.1   Chloride 96 - 106 mmol/L 104  103  106   CO2 20 - 29 mmol/L 19  28  27    Calcium  8.7 - 10.2 mg/dL 9.2  89.7  9.2   Total Protein 6.0 - 8.5 g/dL 6.8  6.7  6.8   Total Bilirubin 0.0 - 1.2 mg/dL <9.7  0.2  0.6   Alkaline Phos 44 - 121 IU/L 74  87  56   AST 0 - 40 IU/L 30  25  29    ALT 0 - 32 IU/L 21  25  29      RUQ sono 03/10/2023: FINDINGS: Gallbladder:   Small stones in the gallbladder lumen. Stable 5 mm gallbladder polyp. No gallbladder wall thickening or pericholecystic fluid. Negative sonographic Murphy's.   Common bile duct:   Diameter: 2.8 mm   Liver:   No focal lesion identified. Within normal limits in parenchymal echogenicity. Portal vein is patent on color Doppler imaging with normal direction of blood flow towards the liver.   Other: None.   IMPRESSION: 1. Cholelithiasis without secondary signs of acute cholecystitis. 2. Stable 5 mm gallbladder polyp.     GI  PROCEDURES:  EGD 04/16/2024: - Normal esophagus.  - No endoscopic esophageal abnormality to explain patient's dysphagia. Esophagus dilated with 54 Fr Maloney.  - Normal stomach. Biopsied to exclude recurrent H. Pylori infection.  - Duodenitis. Biopsied.  - Normal second portion of the duodenum.  Colonoscopy 04/16/2024: - The examined portion of the ileum was normal.  - The entire examined colon is normal. Biopsied.  - The distal rectum and anal verge are normal on retroflexion view. - 10 year recall colonoscopy.    1. 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 Surgical [P], gastric antrum and gastric body :      - GASTRIC ANTRAL AND OXYNTIC MUCOSA WITH NO SPECIFIC HISTOPATHOLOGIC CHANGES      - HELICOBACTER PYLORI-LIKE ORGANISMS ARE NOT IDENTIFIED ON ROUTINE H&E STAIN       3. Surgical [P], random colon :      - COLONIC MUCOSA WITH NO SPECIFIC HISTOPATHOLOGIC CHANGES      - NEGATIVE FOR ACUTE INFLAMMATION, INCREASED INTRAEPITHELIAL LYMPHOCYTES OR      THICKENED SUBEPITHELIAL COLLAGEN TABLE  EGD 09/14/2013: The mucosa of the esophagus appeared normal, biopsies obtained The mucosa of the stomach appeared normal, biopsies obtained The duodenal mucosa was normal, biopsies obtained 1. Surgical [P], endoscopy, biopsy site, duodenal - BENIGN SMALL BOWEL MUCOSA. NO VILLOUS ATROPHY, INFLAMMATION OR OTHER ABNORMALITIES PRESENT. 2. Surgical [P], endoscopy, biopsy site, gastric antrum - HELICOBACTER PYLORI GASTRITIS, NO EVIDENCE OF INTESTINAL METAPLASIA, DYSPLASIA OR MALIGNANCY.   Current Outpatient Medications on File Prior to Visit  Medication Sig Dispense Refill   buPROPion  (WELLBUTRIN  XL) 300 MG 24 hr tablet TAKE 1 TABLET BY MOUTH  EVERY MORNING FOR MOOD,  FOCUS AND CONCENTRATION 90 tablet 3   cholecalciferol (VITAMIN D ) 1000 UNITS tablet Take 1,000 Units by mouth daily.      Cyanocobalamin (VITAMIN B 12 PO) Take 1 tablet by mouth daily.     cyclobenzaprine  (FLEXERIL ) 5 MG tablet Take 1 tablet (5 mg total) by mouth 3 (three) times daily as needed for muscle spasms. 60 tablet 0   estradiol  (DOTTI ) 0.1 MG/24HR patch Place 1 patch onto the skin 2 (two) times a week.     famotidine  (PEPCID ) 20 MG tablet Take 1 tablet (20 mg total) by mouth at bedtime. 30 tablet 1   fluticasone  (FLONASE ) 50 MCG/ACT nasal spray Place 2 sprays into both nostrils at bedtime. (Patient taking differently: Place 2 sprays into both nostrils daily as needed for  allergies.) 16 g 1   hydrocortisone  (ANUSOL -HC) 25 MG suppository Place 1 suppository (25 mg total) rectally at bedtime. 12 suppository 0   loratadine (CLARITIN) 10 MG tablet Take 10 mg by mouth daily.     Magnesium 250 MG TABS Take 250 mg by mouth daily.     metoprolol  tartrate (LOPRESSOR ) 100 MG tablet Take 1 tablet (100 mg total) by mouth 2 (two) times daily. 180 tablet 2   minoxidil  (LONITEN ) 10 MG tablet TAKE 1/2 TABLET BY MOUTH DAILY IN THE MORNING FOR BLOOD PRESSURE 45 tablet 3   montelukast  (SINGULAIR ) 10 MG tablet TAKE 1 TABLET BY MOUTH DAILY 90 tablet 3   olmesartan -hydrochlorothiazide  (BENICAR  HCT) 40-25 MG tablet Take 1 tablet by mouth daily. 90 tablet 3   pantoprazole  (PROTONIX ) 40 MG tablet Take 1 tablet (40 mg total) by mouth daily. 30 tablet 0   rosuvastatin  (CRESTOR ) 10 MG tablet Take  1 tablet  Daily for Cholesterol        /       TAKE      BY      MOUTH 90 tablet 3   TURMERIC PO Take 500 mg by mouth daily.     cholestyramine  (QUESTRAN ) 4 g packet Mix 1 packet in 4 oz of clear liquid of choice once daily. Do not take within 4 hrs of any other medications. (Patient not taking: Reported on 06/05/2024) 30 each 1   No current facility-administered medications on file prior to visit.   Allergies  Allergen Reactions   Penicillins Rash and Dermatitis   Current Medications, Allergies, Past Medical History, Past Surgical  History, Family History and Social History were reviewed in [employer] record.  Review of Systems:   Constitutional: Negative for fever, sweats, chills or weight loss.  Respiratory: Negative for shortness of breath.   Cardiovascular: Negative for chest pain, palpitations and leg swelling.  Gastrointestinal: See HPI.  Musculoskeletal: Negative for back pain or muscle aches.  Neurological: Negative for dizziness, headaches or paresthesias.   Physical Exam: BP 130/76   Pulse 67   Ht 5' 4 (1.626 m)   Wt 168 lb (76.2 kg)   LMP 03/15/2015   BMI 28.84 kg/m  LMP 03/15/2015  General: 48 year old female in no acute distress. Head: Normocephalic and atraumatic. Eyes: No scleral icterus. Conjunctiva pink . Ears: Normal auditory acuity. Mouth: Dentition intact. No ulcers or lesions.  Lungs: Clear throughout to auscultation. Heart: Regular rate and rhythm, no murmur. Abdomen: Soft, nontender and nondistended. No masses or hepatomegaly. Normal bowel sounds x 4 quadrants.  Rectal: Deferred. Musculoskeletal: Symmetrical with no gross deformities. Extremities: No edema. Neurological: Alert oriented x 4. No focal deficits.  Psychological: Alert and cooperative. Normal mood and affect  Assessment and Recommendations:  48 year old female with GERD, H. Pylori (2015) and dysphagia with solid foods and pills.  Food dysphagia abated but she continues to have intermittent pill dysphagia.  EGD 04/16/2024 showed a normal esophagus which was empirically dilated and duodenitis.  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 pylori. -EGD biopsy results discussed with the patient in full detail, I explained there was evidence of peptic duodenitis without evidence of any infectious process - Barium swallow with tablet - Patient instructed to take 3 separate sips of tap water prior to swallowing any pills or food - Continue  Pantoprazole  40 mg once daily and Famotidine  20 mg 1 tab p.o. 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  Diarrhea well-controlled when taking Cholestyramine , water, she stopped taking it prior to her colonoscopy and has not restarted it.  Colonoscopy 04/16/2024 was normal, biopsies of the colon without evidence of microscopic colitis or IBD. - Okay to restart Cholestyramine  4 g packet mixed in 4 ounces of clear liquid of choice to be taken once daily.  Patient aware not to take within 4 hours of any other medication.  Colon cancer screening.  Normal colonoscopy 04/16/2024.  Mother with history of colon polyps.  No known family history of colorectal cancer. - Next screening colonoscopy due 04/2034

## 2024-06-05 NOTE — Patient Instructions (Addendum)
 Continue Pantoprazole  40mg  once daily to be taken 30 minutes before breakfast   Continue Famotidine  20mg  one tab at  bed time as needed  Ok to restart Cholestyramine  4gm packet mix in 4 ounce of clear liquid of choice not to take within 4 hours of any other medication   Try to avoid NSAIDs ie: Ibuprofen/Aleve   You have been scheduled for a Barium Esophogram at [facility] (1st floor of the hospital) on 06/22/2024 at 9:00am. Please arrive 15 minutes prior to your appointment for registration. Make certain not to have anything to eat or drink 3 hours prior to your test. If you need to reschedule for any reason, please contact radiology at [phone] to do so. __________________________________________________________________ A barium swallow is an examination that concentrates on views of the esophagus. This tends to be a double contrast exam (barium and two liquids which, when combined, create a gas to distend the wall of the oesophagus) or single contrast (non-ionic iodine based). The study is usually tailored to your symptoms so a good history is essential. Attention is paid during the study to the form, structure and configuration of the esophagus, looking for functional disorders (such as aspiration, dysphagia, achalasia, motility and reflux) EXAMINATION You may be asked to change into a gown, depending on the type of swallow being performed. A radiologist and radiographer will perform the procedure. The radiologist will advise you of the type of contrast selected for your procedure and direct you during the exam. You will be asked to stand, sit or lie in several different positions and to hold a small amount of fluid in your mouth before being asked to swallow while the imaging is performed .In some instances you may be asked to swallow barium coated marshmallows to assess the motility of a solid food bolus. The exam can be recorded as a digital or video fluoroscopy procedure. POST  PROCEDURE It will take 1-2 days for the barium to pass through your system. To facilitate this, it is important, unless otherwise directed, to increase your fluids for the next 24-48hrs and to resume your normal diet.  This test typically takes about 30 minutes to perform. __________________________________________________________________________________

## 2024-06-22 ENCOUNTER — Ambulatory Visit (HOSPITAL_COMMUNITY)
Admission: RE | Admit: 2024-06-22 | Discharge: 2024-06-22 | Disposition: A | Source: Ambulatory Visit | Attending: Nurse Practitioner

## 2024-06-22 DIAGNOSIS — R1310 Dysphagia, unspecified: Secondary | ICD-10-CM

## 2024-06-22 DIAGNOSIS — K224 Dyskinesia of esophagus: Secondary | ICD-10-CM | POA: Diagnosis not present

## 2024-06-26 ENCOUNTER — Ambulatory Visit: Payer: Self-pay | Admitting: Nurse Practitioner

## 2024-07-02 ENCOUNTER — Other Ambulatory Visit: Payer: Self-pay

## 2024-07-02 MED ORDER — PANTOPRAZOLE SODIUM 40 MG PO TBEC: 40.0000 mg | DELAYED_RELEASE_TABLET | Freq: Two times a day (BID) | ORAL | 3 refills | Status: DC

## 2024-07-03 ENCOUNTER — Telehealth: Payer: Self-pay

## 2024-07-03 ENCOUNTER — Other Ambulatory Visit (HOSPITAL_COMMUNITY): Payer: Self-pay

## 2024-07-03 NOTE — Telephone Encounter (Signed)
 Pharmacy Patient Advocate Encounter   Received notification from Patient Pharmacy that prior authorization for Pantoprazole  Sodium 40MG  dr tablets is required/requested.   Insurance verification completed.   The patient is insured through [facility]   Per test claim: PA required; PA submitted to above mentioned insurance via Latent Key/confirmation #/EOC [id number] Status is pending

## 2024-07-04 ENCOUNTER — Other Ambulatory Visit (HOSPITAL_COMMUNITY): Payer: Self-pay

## 2024-07-04 NOTE — Telephone Encounter (Signed)
 Pharmacy Patient Advocate Encounter  Received notification from [facility] that Prior Authorization for Pantoprazole  Sodium 40MG  dr tablets has been APPROVED from 07-03-2024 to 07-03-2025   PA #/Case ID/Reference #: [id number]

## 2024-07-06 ENCOUNTER — Other Ambulatory Visit: Payer: Self-pay | Admitting: Nurse Practitioner

## 2024-07-06 DIAGNOSIS — I10 Essential (primary) hypertension: Secondary | ICD-10-CM

## 2024-07-06 DIAGNOSIS — F4321 Adjustment disorder with depressed mood: Secondary | ICD-10-CM

## 2024-07-06 DIAGNOSIS — Z9109 Other allergy status, other than to drugs and biological substances: Secondary | ICD-10-CM

## 2024-07-13 ENCOUNTER — Other Ambulatory Visit: Payer: Self-pay

## 2024-07-13 DIAGNOSIS — Z9109 Other allergy status, other than to drugs and biological substances: Secondary | ICD-10-CM

## 2024-07-13 DIAGNOSIS — I10 Essential (primary) hypertension: Secondary | ICD-10-CM

## 2024-07-13 DIAGNOSIS — F4321 Adjustment disorder with depressed mood: Secondary | ICD-10-CM

## 2024-07-13 MED ORDER — ROSUVASTATIN CALCIUM 10 MG PO TABS: ORAL_TABLET | ORAL | 3 refills | Status: AC

## 2024-07-13 MED ORDER — MINOXIDIL 10 MG PO TABS: ORAL_TABLET | ORAL | 3 refills | Status: AC

## 2024-07-13 MED ORDER — OLMESARTAN MEDOXOMIL-HCTZ 40-25 MG PO TABS: 1.0000 | ORAL_TABLET | Freq: Every day | ORAL | 3 refills | Status: DC

## 2024-07-13 MED ORDER — METOPROLOL TARTRATE 100 MG PO TABS: 100.0000 mg | ORAL_TABLET | Freq: Two times a day (BID) | ORAL | 2 refills | Status: AC

## 2024-07-13 MED ORDER — MONTELUKAST SODIUM 10 MG PO TABS: 10.0000 mg | ORAL_TABLET | Freq: Every day | ORAL | 3 refills | Status: AC

## 2024-07-16 MED ORDER — BUPROPION HCL ER (XL) 300 MG PO TB24: ORAL_TABLET | ORAL | 3 refills | Status: AC

## 2024-07-18 ENCOUNTER — Telehealth: Payer: Self-pay

## 2024-07-18 ENCOUNTER — Other Ambulatory Visit: Payer: Self-pay

## 2024-07-18 NOTE — Telephone Encounter (Signed)
 Patient aware to upload current insurance card via mychart. Once we receive it we will submit a PA.  Copied from CRM [id number]. Topic: Clinical - Medication Prior Auth >> Jul 18, 2024  3:02 PM Larissa S wrote: Reason for CRM: Patient states her insurance provider informed her that minoxidil  (LONITEN ) 10 MG tablet needs a prior authorization. States she has been out of medication for 3 weeks. She asked that this be completed as soon as possible by [employer]- [phone]. Patient requesting response via MyChart on completion.     [facility] [place], [place] - [address] [facility] [address] [place] [address] [place] [address] Phone: [phone] Fax: [phone] Hours: Not open 24 hours

## 2024-07-23 ENCOUNTER — Other Ambulatory Visit: Payer: Self-pay

## 2024-07-26 ENCOUNTER — Other Ambulatory Visit: Payer: Self-pay

## 2024-07-26 DIAGNOSIS — Z79899 Other long term (current) drug therapy: Secondary | ICD-10-CM

## 2024-07-26 DIAGNOSIS — I10 Essential (primary) hypertension: Secondary | ICD-10-CM

## 2024-07-26 DIAGNOSIS — E785 Hyperlipidemia, unspecified: Secondary | ICD-10-CM

## 2024-07-26 DIAGNOSIS — Z1329 Encounter for screening for other suspected endocrine disorder: Secondary | ICD-10-CM

## 2024-07-26 DIAGNOSIS — K2100 Gastro-esophageal reflux disease with esophagitis, without bleeding: Secondary | ICD-10-CM

## 2024-07-27 ENCOUNTER — Other Ambulatory Visit: Payer: Self-pay

## 2024-07-27 DIAGNOSIS — Z79899 Other long term (current) drug therapy: Secondary | ICD-10-CM

## 2024-07-27 DIAGNOSIS — K2100 Gastro-esophageal reflux disease with esophagitis, without bleeding: Secondary | ICD-10-CM

## 2024-07-27 DIAGNOSIS — Z1329 Encounter for screening for other suspected endocrine disorder: Secondary | ICD-10-CM

## 2024-07-27 DIAGNOSIS — E785 Hyperlipidemia, unspecified: Secondary | ICD-10-CM

## 2024-07-27 DIAGNOSIS — I10 Essential (primary) hypertension: Secondary | ICD-10-CM

## 2024-07-27 LAB — COMPREHENSIVE METABOLIC PANEL WITH GFR
ALT: 25 IU/L (ref 0–32)
AST: 21 IU/L (ref 0–40)
Albumin: 4.6 g/dL (ref 3.9–4.9)
Alkaline Phosphatase: 80 IU/L (ref 41–116)
BUN/Creatinine Ratio: 15 (ref 9–23)
BUN: 13 mg/dL (ref 6–24)
Bilirubin Total: 0.4 mg/dL (ref 0.0–1.2)
CO2: 28 mmol/L (ref 20–29)
Calcium: 9.6 mg/dL (ref 8.7–10.2)
Chloride: 98 mmol/L (ref 96–106)
Creatinine, Ser: 0.88 mg/dL (ref 0.57–1.00)
Globulin, Total: 2.2 g/dL (ref 1.5–4.5)
Glucose: 95 mg/dL (ref 70–99)
Potassium: 4.2 mmol/L (ref 3.5–5.2)
Sodium: 142 mmol/L (ref 134–144)
Total Protein: 6.8 g/dL (ref 6.0–8.5)
eGFR: 80 mL/min/1.73

## 2024-07-27 LAB — CBC WITH DIFFERENTIAL/PLATELET
Basophils Absolute: 0.1 x10E3/uL (ref 0.0–0.2)
Basos: 1 %
EOS (ABSOLUTE): 0.1 x10E3/uL (ref 0.0–0.4)
Eos: 1 %
Hematocrit: 42.8 % (ref 34.0–46.6)
Hemoglobin: 14.9 g/dL (ref 11.1–15.9)
Immature Grans (Abs): 0 x10E3/uL (ref 0.0–0.1)
Immature Granulocytes: 0 %
Lymphocytes Absolute: 2.7 x10E3/uL (ref 0.7–3.1)
Lymphs: 31 %
MCH: 33.6 pg — ABNORMAL HIGH (ref 26.6–33.0)
MCHC: 34.8 g/dL (ref 31.5–35.7)
MCV: 96 fL (ref 79–97)
Monocytes Absolute: 0.8 x10E3/uL (ref 0.1–0.9)
Monocytes: 9 %
Neutrophils Absolute: 5.1 x10E3/uL (ref 1.4–7.0)
Neutrophils: 58 %
Platelets: 157 x10E3/uL (ref 150–450)
RBC: 4.44 x10E6/uL (ref 3.77–5.28)
RDW: 12.6 % (ref 11.7–15.4)
WBC: 8.7 x10E3/uL (ref 3.4–10.8)

## 2024-07-27 LAB — LIPID PANEL
Chol/HDL Ratio: 4 ratio (ref 0.0–4.4)
Cholesterol, Total: 201 mg/dL — ABNORMAL HIGH (ref 100–199)
HDL: 50 mg/dL
LDL Chol Calc (NIH): 123 mg/dL — ABNORMAL HIGH (ref 0–99)
Triglycerides: 157 mg/dL — ABNORMAL HIGH (ref 0–149)
VLDL Cholesterol Cal: 28 mg/dL (ref 5–40)

## 2024-07-27 LAB — HEMOGLOBIN A1C
Est. average glucose Bld gHb Est-mCnc: 114 mg/dL
Hgb A1c MFr Bld: 5.6 % (ref 4.8–5.6)

## 2024-07-27 LAB — TSH: TSH: 1.87 u[IU]/mL (ref 0.450–4.500)

## 2024-07-31 NOTE — Assessment & Plan Note (Signed)
 SABRA

## 2024-07-31 NOTE — Assessment & Plan Note (Signed)
 Continue to work on decreasing amount of cigarettes.

## 2024-07-31 NOTE — Assessment & Plan Note (Signed)
 Gastroesophageal reflux disease with esophagitis Difficulty swallowing persists despite increased Protonix . Swallow study and endoscopy with biopsies conducted. Symptoms frequency decreased with current management. - Continue Protonix  40 mg nightly, and Pepcid  20mg  as prescribed. - Continue current swallowing strategies, such as drinking water before medication.

## 2024-07-31 NOTE — Progress Notes (Unsigned)
 "  Subjective:  Patient ID: Andrea Owens, female    DOB: 26-Feb-1976  Age: 49 y.o. MRN: [id number]  No chief complaint on file.   HPI: Discussed the use of AI scribe software for clinical note transcription with the patient, who gave verbal consent to proceed.         01/30/2024    9:09 AM 10/27/2023   10:53 AM  Depression screen PHQ 2/9  Decreased Interest 0 0  Down, Depressed, Hopeless 0 0  PHQ - 2 Score 0 0  Altered sleeping 0   Tired, decreased energy 0   Change in appetite 0   Feeling bad or failure about yourself  0   Trouble concentrating 0   Moving slowly or fidgety/restless 0   Suicidal thoughts 0   PHQ-9 Score 0    Difficult doing work/chores Not difficult at all      Data saved with a previous flowsheet row definition        01/30/2024    9:09 AM  Fall Risk   Falls in the past year? 0  Number falls in past yr: 0  Injury with Fall? 0   Risk for fall due to : No Fall Risks  Follow up Falls evaluation completed     Data saved with a previous flowsheet row definition    Patient Care Team: Milon Cleaves, [place] as PCP - General (Physician Assistant) Albertus, Gordy HERO, MD as Consulting Physician (Gastroenterology) Marget Lenis, MD as Consulting Physician (Obstetrics and Gynecology)   Review of Systems  Constitutional:  Negative for appetite change, fatigue and fever.  HENT:  Negative for congestion, ear pain, sinus pressure and sore throat.   Respiratory:  Negative for cough, chest tightness, shortness of breath and wheezing.   Cardiovascular:  Negative for chest pain and palpitations.  Gastrointestinal:  Negative for abdominal pain, constipation, diarrhea, nausea and vomiting.  Genitourinary:  Negative for dysuria and hematuria.  Musculoskeletal:  Negative for arthralgias, back pain, joint swelling and myalgias.  Skin:  Negative for rash.  Neurological:  Negative for dizziness, weakness and headaches.  Psychiatric/Behavioral:  Negative for dysphoric mood. The  patient is not nervous/anxious.     Medications Ordered Prior to Encounter[1] Past Medical History:  Diagnosis Date   Allergy    Dyspnea    Endometriosis    GERD (gastroesophageal reflux disease)    Hyperlipidemia    Hypertension    Pneumonia    Prediabetes    Vitamin D  deficiency    Past Surgical History:  Procedure Laterality Date   ABDOMINAL HYSTERECTOMY  03/2015   Dr. Marget   CHOLECYSTECTOMY N/A 06/13/2023   Procedure: LAPAROSCOPIC CHOLECYSTECTOMY W/ ICG DYE;  Surgeon: Tanda Locus, MD;  Location: [facility];  Service: General;  Laterality: N/A;  ICG DYE   REFRACTIVE SURGERY Bilateral 07/13/2003    Family History  Problem Relation Age of Onset   Hyperlipidemia Mother    Irritable bowel syndrome Mother    Arthritis Mother    COPD Mother    Colon polyps Mother    Cystic fibrosis Mother    Heart disease Father    Hyperlipidemia Father    Hypertension Father    Lung cancer Father        mets to brain   COPD Father    Hearing loss Father    Brain cancer Father    Fibromyalgia Sister    Leukemia Maternal Grandmother    Prostate cancer Paternal Grandfather    Breast cancer Maternal  Aunt        Great Aunt   Colon cancer Neg Hx    Esophageal cancer Neg Hx    Stomach cancer Neg Hx    Rectal cancer Neg Hx    Social History   Socioeconomic History   Marital status: Married    Spouse name: Not on file   Number of children: 0   Years of education: Not on file   Highest education level: 12th grade  Occupational History   Occupation: Production Designer, Theatre/television/film in Training And Development Officer: [employer]  Tobacco Use   Smoking status: Every Day    Current packs/day: 0.50    Average packs/day: 0.5 packs/day for 20.0 years (10.0 ttl pk-yrs)    Types: Cigarettes   Smokeless tobacco: Never  Vaping Use   Vaping status: Never Used  Substance and Sexual Activity   Alcohol use: Yes    Comment: 1-2 per day   Drug use: No   Sexual activity: Not on file    Comment: on period  Other Topics  Concern   Not on file  Social History Narrative   Not on file   Social Drivers of Health   Tobacco Use: High Risk (06/05/2024)   Patient History    Smoking Tobacco Use: Every Day    Smokeless Tobacco Use: Never    Passive Exposure: Not on file  Financial Resource Strain: Low Risk (07/29/2024)   Overall Financial Resource Strain (CARDIA)    Difficulty of Paying Living Expenses: Not hard at all  Food Insecurity: No Food Insecurity (07/29/2024)   Epic    Worried About Programme Researcher, Broadcasting/film/video in the Last Year: Never true    Ran Out of Food in the Last Year: Never true  Transportation Needs: No Transportation Needs (07/29/2024)   Epic    Lack of Transportation (Medical): No    Lack of Transportation (Non-Medical): No  Physical Activity: Insufficiently Active (07/29/2024)   Exercise Vital Sign    Days of Exercise per Week: 3 days    Minutes of Exercise per Session: 30 min  Stress: No Stress Concern Present (07/29/2024)   [employer] of Occupational Health - Occupational Stress Questionnaire    Feeling of Stress: Not at all  Social Connections: Moderately Integrated (07/29/2024)   Social Connection and Isolation Panel    Frequency of Communication with Friends and Family: More than three times a week    Frequency of Social Gatherings with Friends and Family: Once a week    Attends Religious Services: More than 4 times per year    Active Member of Clubs or Organizations: No    Attends Banker Meetings: Not on file    Marital Status: Married  Depression (PHQ2-9): Low Risk (01/30/2024)   Depression (PHQ2-9)    PHQ-2 Score: 0  Alcohol Screen: Low Risk (07/29/2024)   Alcohol Screen    Last Alcohol Screening Score (AUDIT): 3  Housing: Low Risk (07/29/2024)   Epic    Unable to Pay for Housing in the Last Year: No    Number of Times Moved in the Last Year: 0    Homeless in the Last Year: No  Utilities: Not At Risk (10/27/2023)   [employer] Utilities    Threatened with loss of  utilities: No  Health Literacy: Adequate Health Literacy (10/27/2023)   B1300 Health Literacy    Frequency of need for help with medical instructions: Never    Objective:  LMP 03/15/2015  06/05/2024    8:18 AM 04/16/2024    3:00 PM 04/16/2024    2:50 PM  BP/Weight  Systolic BP 130 142 121  Diastolic BP 76 82 53  Wt. (Lbs) 168    BMI 28.84 kg/m2      Physical Exam     Lab Results  Component Value Date   WBC 8.7 07/27/2024   HGB 14.9 07/27/2024   HCT 42.8 07/27/2024   PLT 157 07/27/2024   GLUCOSE 95 07/27/2024   CHOL 201 (H) 07/27/2024   TRIG 157 (H) 07/27/2024   HDL 50 07/27/2024   LDLCALC 123 (H) 07/27/2024   ALT 25 07/27/2024   AST 21 07/27/2024   NA 142 07/27/2024   K 4.2 07/27/2024   CL 98 07/27/2024   CREATININE 0.88 07/27/2024   BUN 13 07/27/2024   CO2 28 07/27/2024   TSH 1.870 07/27/2024   HGBA1C 5.6 07/27/2024    Results for orders placed or performed in visit on 07/27/24  TSH   Collection Time: 07/27/24  7:58 AM  Result Value Ref Range   TSH 1.870 0.450 - 4.500 uIU/mL  CBC with Differential   Collection Time: 07/27/24  7:58 AM  Result Value Ref Range   WBC 8.7 3.4 - 10.8 x10E3/uL   RBC 4.44 3.77 - 5.28 x10E6/uL   Hemoglobin 14.9 11.1 - 15.9 g/dL   Hematocrit 57.1 65.9 - 46.6 %   MCV 96 79 - 97 fL   MCH 33.6 (H) 26.6 - 33.0 pg   MCHC 34.8 31.5 - 35.7 g/dL   RDW 87.3 88.2 - 84.5 %   Platelets 157 150 - 450 x10E3/uL   Neutrophils 58 Not Estab. %   Lymphs 31 Not Estab. %   Monocytes 9 Not Estab. %   Eos 1 Not Estab. %   Basos 1 Not Estab. %   Neutrophils Absolute 5.1 1.4 - 7.0 x10E3/uL   Lymphocytes Absolute 2.7 0.7 - 3.1 x10E3/uL   Monocytes Absolute 0.8 0.1 - 0.9 x10E3/uL   EOS (ABSOLUTE) 0.1 0.0 - 0.4 x10E3/uL   Basophils Absolute 0.1 0.0 - 0.2 x10E3/uL   Immature Granulocytes 0 Not Estab. %   Immature Grans (Abs) 0.0 0.0 - 0.1 x10E3/uL  Hemoglobin A1c   Collection Time: 07/27/24  7:58 AM  Result Value Ref Range   Hgb A1c MFr  Bld 5.6 4.8 - 5.6 %   Est. average glucose Bld gHb Est-mCnc 114 mg/dL  Lipid Panel   Collection Time: 07/27/24  7:58 AM  Result Value Ref Range   Cholesterol, Total 201 (H) 100 - 199 mg/dL   Triglycerides 842 (H) 0 - 149 mg/dL   HDL 50 >60 mg/dL   VLDL Cholesterol Cal 28 5 - 40 mg/dL   LDL Chol Calc (NIH) 876 (H) 0 - 99 mg/dL   Chol/HDL Ratio 4.0 0.0 - 4.4 ratio  Comprehensive metabolic panel with GFR   Collection Time: 07/27/24  7:58 AM  Result Value Ref Range   Glucose 95 70 - 99 mg/dL   BUN 13 6 - 24 mg/dL   Creatinine, Ser 9.11 0.57 - 1.00 mg/dL   eGFR 80 >40 fO/fpw/8.26   BUN/Creatinine Ratio 15 9 - 23   Sodium 142 134 - 144 mmol/L   Potassium 4.2 3.5 - 5.2 mmol/L   Chloride 98 96 - 106 mmol/L   CO2 28 20 - 29 mmol/L   Calcium  9.6 8.7 - 10.2 mg/dL   Total Protein 6.8 6.0 - 8.5 g/dL  Albumin 4.6 3.9 - 4.9 g/dL   Globulin, Total 2.2 1.5 - 4.5 g/dL   Bilirubin Total 0.4 0.0 - 1.2 mg/dL   Alkaline Phosphatase 80 41 - 116 IU/L   AST 21 0 - 40 IU/L   ALT 25 0 - 32 IU/L  .  Assessment & Plan:   Assessment & Plan Primary hypertension     Hyperlipidemia, unspecified hyperlipidemia type     Gastroesophageal reflux disease with esophagitis without hemorrhage     Other irritable bowel syndrome     Environmental allergies     Tobacco use disorder     Depressive disorder       There is no height or weight on file to calculate BMI.  Assessment and Plan      No orders of the defined types were placed in this encounter.   No orders of the defined types were placed in this encounter.      Follow-up: No follow-ups on file.  An After Visit Summary was printed and given to the patient.   I,Lauren M Auman,acting as a neurosurgeon for [employer], [employer].,have documented all relevant documentation on the behalf of Nola Angles, PA,as directed by  Nola Angles, PA while in the presence of Nola Angles, [place].    Nola Angles, [place] [facility] [phone]    [1]  Current Outpatient Medications on File Prior to Visit  Medication Sig Dispense Refill   buPROPion  (WELLBUTRIN  XL) 300 MG 24 hr tablet TAKE 1 TABLET BY MOUTH  EVERY MORNING FOR MOOD,  FOCUS AND CONCENTRATION 90 tablet 3   cholecalciferol (VITAMIN D ) 1000 UNITS tablet Take 1,000 Units by mouth daily.     cholestyramine  (QUESTRAN ) 4 g packet Mix 1 packet in 4 oz of clear liquid of choice once daily. Do not take within 4 hrs of any other medications. (Patient not taking: Reported on 06/05/2024) 30 each 1   Cyanocobalamin (VITAMIN B 12 PO) Take 1 tablet by mouth daily.     cyclobenzaprine  (FLEXERIL ) 5 MG tablet Take 1 tablet (5 mg total) by mouth 3 (three) times daily as needed for muscle spasms. 60 tablet 0   estradiol  (DOTTI ) 0.1 MG/24HR patch Place 1 patch onto the skin 2 (two) times a week.     famotidine  (PEPCID ) 20 MG tablet Take 1 tablet (20 mg total) by mouth at bedtime. 30 tablet 1   fluticasone  (FLONASE ) 50 MCG/ACT nasal spray Place 2 sprays into both nostrils at bedtime. (Patient taking differently: Place 2 sprays into both nostrils daily as needed for allergies.) 16 g 1   hydrocortisone  (ANUSOL -HC) 25 MG suppository Place 1 suppository (25 mg total) rectally at bedtime. 12 suppository 0   loratadine (CLARITIN) 10 MG tablet Take 10 mg by mouth daily.     Magnesium 250 MG TABS Take 250 mg by mouth daily.     metoprolol  tartrate (LOPRESSOR ) 100 MG tablet Take 1 tablet (100 mg total) by mouth 2 (two) times daily. 180 tablet 2   minoxidil  (LONITEN ) 10 MG tablet TAKE 1/2 TABLET BY MOUTH DAILY IN THE MORNING FOR BLOOD PRESSURE 45 tablet 3   montelukast  (SINGULAIR ) 10 MG tablet Take 1 tablet (10 mg total) by mouth daily. 90 tablet 3   olmesartan -hydrochlorothiazide  (BENICAR  HCT) 40-25 MG tablet Take 1 tablet by mouth daily. 90 tablet 3   pantoprazole  (PROTONIX ) 40 MG tablet Take 1 tablet (40 mg total) by mouth daily. 30 tablet 0   pantoprazole  (PROTONIX ) 40 MG tablet Take  1  tablet (40 mg total) by mouth 2 (two) times daily. 60 tablet 3   rosuvastatin  (CRESTOR ) 10 MG tablet Take  1 tablet  Daily for Cholesterol        /       TAKE      BY      MOUTH 90 tablet 3   TURMERIC PO Take 500 mg by mouth daily.     No current facility-administered medications on file prior to visit.   "

## 2024-07-31 NOTE — Assessment & Plan Note (Signed)
 Andrea Owens

## 2024-08-01 ENCOUNTER — Other Ambulatory Visit (HOSPITAL_COMMUNITY): Payer: Self-pay

## 2024-08-01 ENCOUNTER — Ambulatory Visit: Payer: Self-pay | Admitting: Physician Assistant

## 2024-08-01 ENCOUNTER — Encounter: Payer: Self-pay | Admitting: Physician Assistant

## 2024-08-01 VITALS — BP 132/90 | HR 58 | Temp 97.8°F | Ht 64.0 in | Wt 169.2 lb

## 2024-08-01 DIAGNOSIS — K588 Other irritable bowel syndrome: Secondary | ICD-10-CM

## 2024-08-01 DIAGNOSIS — E785 Hyperlipidemia, unspecified: Secondary | ICD-10-CM | POA: Diagnosis not present

## 2024-08-01 DIAGNOSIS — Z9109 Other allergy status, other than to drugs and biological substances: Secondary | ICD-10-CM

## 2024-08-01 DIAGNOSIS — K2100 Gastro-esophageal reflux disease with esophagitis, without bleeding: Secondary | ICD-10-CM | POA: Diagnosis not present

## 2024-08-01 DIAGNOSIS — F32A Depression, unspecified: Secondary | ICD-10-CM

## 2024-08-01 DIAGNOSIS — I10 Essential (primary) hypertension: Secondary | ICD-10-CM | POA: Diagnosis not present

## 2024-08-01 DIAGNOSIS — F17200 Nicotine dependence, unspecified, uncomplicated: Secondary | ICD-10-CM

## 2024-08-02 ENCOUNTER — Other Ambulatory Visit: Payer: Self-pay

## 2024-08-02 DIAGNOSIS — I10 Essential (primary) hypertension: Secondary | ICD-10-CM

## 2024-08-02 MED ORDER — OLMESARTAN MEDOXOMIL-HCTZ 40-25 MG PO TABS: 1.0000 | ORAL_TABLET | Freq: Every day | ORAL | 3 refills | Status: AC

## 2024-08-03 ENCOUNTER — Telehealth: Payer: Self-pay

## 2024-08-03 ENCOUNTER — Telehealth: Payer: Self-pay | Admitting: Physician Assistant

## 2024-08-03 ENCOUNTER — Other Ambulatory Visit (HOSPITAL_COMMUNITY): Payer: Self-pay

## 2024-08-03 NOTE — Telephone Encounter (Signed)
 Copied from CRM #[id number]. Topic: General - Other >> Aug 03, 2024 10:27 AM Joesph NOVAK wrote: Reason for CRM: Gustavo from [facility] is calling to confirm patients diagnosis.  Ph: [phone].

## 2024-08-03 NOTE — Telephone Encounter (Signed)
 Spoke with pharmacist and she stated they have all the information for the RX and is not sure who called as they do not normally call for DX for a medication. The number given on this CRM was a IT service. I went into the patient's chart and got the [facility] phone number from there.

## 2024-08-03 NOTE — Telephone Encounter (Signed)
 Pharmacy Patient Advocate Encounter   Received notification from Physician's Office that prior authorization for Minoxidil  is required/requested.   Insurance verification completed.   The patient is insured through [employer].   Per test claim: PA required; PA submitted to above mentioned insurance via Latent Key/confirmation #/EOC [id number] Status is pending

## 2024-08-03 NOTE — Telephone Encounter (Signed)
 Pharmacy Patient Advocate Encounter  Received notification from [facility] that Prior Authorization for Minoxidil  has been APPROVED from 08/03/2024 to 08/03/2025. Ran test claim, Copay is $3.62. This test claim was processed through [facility]- copay amounts may vary at other pharmacies due to pharmacy/plan contracts, or as the patient moves through the different stages of their insurance plan.   PA #/Case ID/Reference #: [id number]

## 2024-08-20 ENCOUNTER — Ambulatory Visit: Admitting: Nurse Practitioner

## 2025-01-29 ENCOUNTER — Ambulatory Visit: Admitting: Physician Assistant
